# Patient Record
Sex: Female | Born: 1985 | Race: White | Hispanic: No | Marital: Married | Smoking: Never smoker
Health system: Southern US, Community
[De-identification: ages and names within clinical notes are randomized; demographics above are authoritative.]

## PROBLEM LIST (undated history)

## (undated) ENCOUNTER — Inpatient Hospital Stay (HOSPITAL_COMMUNITY): Payer: Self-pay

## (undated) DIAGNOSIS — K429 Umbilical hernia without obstruction or gangrene: Secondary | ICD-10-CM

## (undated) DIAGNOSIS — N200 Calculus of kidney: Secondary | ICD-10-CM

## (undated) DIAGNOSIS — Z8619 Personal history of other infectious and parasitic diseases: Secondary | ICD-10-CM

## (undated) DIAGNOSIS — Q179 Congenital malformation of ear, unspecified: Secondary | ICD-10-CM

## (undated) DIAGNOSIS — O24419 Gestational diabetes mellitus in pregnancy, unspecified control: Secondary | ICD-10-CM

## (undated) DIAGNOSIS — Q872 Congenital malformation syndromes predominantly involving limbs: Secondary | ICD-10-CM

## (undated) DIAGNOSIS — M1990 Unspecified osteoarthritis, unspecified site: Secondary | ICD-10-CM

## (undated) DIAGNOSIS — M549 Dorsalgia, unspecified: Secondary | ICD-10-CM

## (undated) DIAGNOSIS — G8929 Other chronic pain: Secondary | ICD-10-CM

## (undated) DIAGNOSIS — M419 Scoliosis, unspecified: Secondary | ICD-10-CM

## (undated) DIAGNOSIS — H9190 Unspecified hearing loss, unspecified ear: Secondary | ICD-10-CM

## (undated) DIAGNOSIS — Q899 Congenital malformation, unspecified: Secondary | ICD-10-CM

## (undated) HISTORY — DX: Congenital malformation, unspecified: Q89.9

## (undated) HISTORY — PX: EAR CANALOPLASTY: SHX1481

## (undated) HISTORY — DX: Congenital malformation syndromes predominantly involving limbs: Q87.2

## (undated) HISTORY — DX: Umbilical hernia without obstruction or gangrene: K42.9

## (undated) HISTORY — DX: Unspecified hearing loss, unspecified ear: H91.90

## (undated) HISTORY — PX: TONSILLECTOMY: SUR1361

## (undated) HISTORY — PX: MANDIBLE RECONSTRUCTION: SHX431

## (undated) HISTORY — PX: LEG SURGERY: SHX1003

## (undated) HISTORY — DX: Personal history of other infectious and parasitic diseases: Z86.19

---
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
## 2004-07-12 ENCOUNTER — Ambulatory Visit: Payer: Self-pay | Admitting: Internal Medicine

## 2004-12-24 ENCOUNTER — Ambulatory Visit: Payer: Self-pay | Admitting: Internal Medicine

## 2005-01-15 ENCOUNTER — Other Ambulatory Visit: Admission: RE | Admit: 2005-01-15 | Discharge: 2005-01-15 | Payer: Self-pay | Admitting: Internal Medicine

## 2005-01-15 ENCOUNTER — Ambulatory Visit: Payer: Self-pay | Admitting: Internal Medicine

## 2005-01-15 ENCOUNTER — Encounter: Payer: Self-pay | Admitting: Internal Medicine

## 2005-02-22 ENCOUNTER — Ambulatory Visit: Payer: Self-pay | Admitting: Internal Medicine

## 2005-10-10 ENCOUNTER — Emergency Department (HOSPITAL_COMMUNITY): Admission: EM | Admit: 2005-10-10 | Discharge: 2005-10-10 | Payer: Self-pay | Admitting: Emergency Medicine

## 2005-10-10 ENCOUNTER — Ambulatory Visit: Payer: Self-pay | Admitting: Internal Medicine

## 2006-04-17 ENCOUNTER — Ambulatory Visit: Payer: Self-pay | Admitting: Internal Medicine

## 2006-04-21 ENCOUNTER — Ambulatory Visit: Payer: Self-pay | Admitting: Internal Medicine

## 2006-04-21 LAB — CONVERTED CEMR LAB
ALT: 9 units/L (ref 0–40)
AST: 13 units/L (ref 0–37)
Albumin: 3.9 g/dL (ref 3.5–5.2)
Alkaline Phosphatase: 44 units/L (ref 39–117)
BUN: 8 mg/dL (ref 6–23)
Basophils Absolute: 0 10*3/uL (ref 0.0–0.1)
Basophils Relative: 0.5 % (ref 0.0–1.0)
Bilirubin, Direct: 0.1 mg/dL (ref 0.0–0.3)
CO2: 28 meq/L (ref 19–32)
Calcium: 9.2 mg/dL (ref 8.4–10.5)
Chloride: 108 meq/L (ref 96–112)
Cholesterol: 158 mg/dL (ref 0–200)
Creatinine, Ser: 0.7 mg/dL (ref 0.4–1.2)
Crystals: NEGATIVE
Eosinophils Absolute: 0.1 10*3/uL (ref 0.0–0.6)
Eosinophils Relative: 1 % (ref 0.0–5.0)
Ferritin: 60.8 ng/mL (ref 10.0–291.0)
GFR calc Af Amer: 135 mL/min
GFR calc non Af Amer: 111 mL/min
Glucose, Bld: 89 mg/dL (ref 70–99)
HCT: 41.6 % (ref 36.0–46.0)
HDL: 36.7 mg/dL — ABNORMAL LOW (ref >39.0)
Hemoglobin: 14.5 g/dL (ref 12.0–15.0)
LDL Cholesterol: 110 mg/dL — ABNORMAL HIGH (ref 0–99)
Leukocytes, UA: NEGATIVE
Lymphocytes Relative: 30.6 % (ref 12.0–46.0)
MCHC: 34.8 g/dL (ref 30.0–36.0)
MCV: 98.7 fL (ref 78.0–100.0)
Monocytes Absolute: 0.5 10*3/uL (ref 0.2–0.7)
Monocytes Relative: 8.8 % (ref 3.0–11.0)
Mucus, UA: NEGATIVE
Neutro Abs: 3.4 10*3/uL (ref 1.4–7.7)
Neutrophils Relative %: 59.1 % (ref 43.0–77.0)
Nitrite: NEGATIVE
Platelets: 227 10*3/uL (ref 150–400)
Potassium: 4.1 meq/L (ref 3.5–5.1)
RBC: 4.21 M/uL (ref 3.87–5.11)
RDW: 12 % (ref 11.5–14.6)
Sodium: 139 meq/L (ref 135–145)
Specific Gravity, Urine: 1.025 (ref 1.000–1.03)
TSH: 1.06 microintl units/mL (ref 0.35–5.50)
Total Bilirubin: 1.2 mg/dL (ref 0.3–1.2)
Total CHOL/HDL Ratio: 4.3
Total Protein: 6.7 g/dL (ref 6.0–8.3)
Triglycerides: 55 mg/dL (ref 0–149)
Urine Glucose: NEGATIVE mg/dL
Urobilinogen, UA: 1 (ref 0.0–1.0)
VLDL: 11 mg/dL (ref 0–40)
WBC, UA: NONE SEEN cells/hpf
WBC: 5.7 10*3/uL (ref 4.5–10.5)
pH: 6.5 (ref 5.0–8.0)

## 2006-06-20 ENCOUNTER — Ambulatory Visit: Payer: Self-pay | Admitting: Internal Medicine

## 2006-11-18 ENCOUNTER — Ambulatory Visit: Payer: Self-pay | Admitting: Internal Medicine

## 2006-11-18 DIAGNOSIS — R079 Chest pain, unspecified: Secondary | ICD-10-CM | POA: Insufficient documentation

## 2006-11-18 DIAGNOSIS — J029 Acute pharyngitis, unspecified: Secondary | ICD-10-CM | POA: Insufficient documentation

## 2006-11-18 DIAGNOSIS — M549 Dorsalgia, unspecified: Secondary | ICD-10-CM | POA: Insufficient documentation

## 2006-11-18 LAB — CONVERTED CEMR LAB
Bilirubin Urine: NEGATIVE
Glucose, Urine, Semiquant: NEGATIVE
Ketones, urine, test strip: NEGATIVE
Nitrite: NEGATIVE
Rapid Strep: NEGATIVE
Specific Gravity, Urine: 1.02
Urobilinogen, UA: NEGATIVE
pH: 7.5

## 2006-11-19 ENCOUNTER — Encounter: Payer: Self-pay | Admitting: Internal Medicine

## 2007-01-26 ENCOUNTER — Ambulatory Visit: Payer: Self-pay | Admitting: Internal Medicine

## 2007-01-26 DIAGNOSIS — R109 Unspecified abdominal pain: Secondary | ICD-10-CM | POA: Insufficient documentation

## 2007-01-26 DIAGNOSIS — J069 Acute upper respiratory infection, unspecified: Secondary | ICD-10-CM | POA: Insufficient documentation

## 2007-01-26 LAB — CONVERTED CEMR LAB
Glucose, Urine, Semiquant: NEGATIVE
Specific Gravity, Urine: 1.025
pH: 6

## 2007-01-27 ENCOUNTER — Encounter: Payer: Self-pay | Admitting: Internal Medicine

## 2008-03-28 ENCOUNTER — Ambulatory Visit: Payer: Self-pay | Admitting: Internal Medicine

## 2008-03-28 ENCOUNTER — Telehealth: Payer: Self-pay | Admitting: Internal Medicine

## 2008-03-28 LAB — CONVERTED CEMR LAB
Nitrite: NEGATIVE
Specific Gravity, Urine: 1.02

## 2008-03-29 ENCOUNTER — Encounter: Payer: Self-pay | Admitting: Internal Medicine

## 2008-07-05 ENCOUNTER — Emergency Department (HOSPITAL_COMMUNITY): Admission: EM | Admit: 2008-07-05 | Discharge: 2008-07-05 | Payer: Self-pay | Admitting: Emergency Medicine

## 2009-03-27 ENCOUNTER — Encounter: Payer: Self-pay | Admitting: Internal Medicine

## 2010-04-17 NOTE — Miscellaneous (Signed)
Summary: Flu Vaccination/[facility]  Flu Vaccination/[facility]   Imported By: Maryln Gottron 04/04/2009 12:56:32  _____________________________________________________________________  External Attachment:    Type:   Image     Comment:   External Document

## 2010-10-01 ENCOUNTER — Emergency Department (HOSPITAL_COMMUNITY)
Admission: EM | Admit: 2010-10-01 | Discharge: 2010-10-01 | Disposition: A | Discharge status: Home / Self Care | Payer: Self-pay | Attending: Emergency Medicine | Admitting: Emergency Medicine

## 2010-10-01 DIAGNOSIS — R300 Dysuria: Secondary | ICD-10-CM | POA: Insufficient documentation

## 2010-10-01 DIAGNOSIS — N390 Urinary tract infection, site not specified: Secondary | ICD-10-CM | POA: Insufficient documentation

## 2010-10-01 DIAGNOSIS — R109 Unspecified abdominal pain: Secondary | ICD-10-CM | POA: Insufficient documentation

## 2010-10-01 LAB — URINALYSIS, ROUTINE W REFLEX MICROSCOPIC
Bilirubin Urine: NEGATIVE
Glucose, UA: NEGATIVE mg/dL
Ketones, ur: NEGATIVE mg/dL
Nitrite: POSITIVE — AB
Protein, ur: NEGATIVE mg/dL

## 2010-10-01 LAB — WET PREP, GENITAL
Clue Cells Wet Prep HPF POC: NONE SEEN
Trich, Wet Prep: NONE SEEN

## 2010-10-01 LAB — URINE MICROSCOPIC-ADD ON

## 2010-10-01 LAB — POCT PREGNANCY, URINE: Preg Test, Ur: NEGATIVE

## 2011-06-13 ENCOUNTER — Inpatient Hospital Stay (HOSPITAL_COMMUNITY): Payer: Self-pay

## 2011-06-13 ENCOUNTER — Inpatient Hospital Stay (HOSPITAL_COMMUNITY)
Admission: AD | Admit: 2011-06-13 | Discharge: 2011-06-13 | Disposition: A | Discharge status: Home / Self Care | Payer: Self-pay | Source: Ambulatory Visit | Attending: Family Medicine | Admitting: Family Medicine

## 2011-06-13 ENCOUNTER — Encounter (HOSPITAL_COMMUNITY): Payer: Self-pay | Admitting: *Deleted

## 2011-06-13 DIAGNOSIS — R102 Pelvic and perineal pain: Secondary | ICD-10-CM

## 2011-06-13 DIAGNOSIS — N949 Unspecified condition associated with female genital organs and menstrual cycle: Secondary | ICD-10-CM

## 2011-06-13 DIAGNOSIS — M549 Dorsalgia, unspecified: Secondary | ICD-10-CM

## 2011-06-13 DIAGNOSIS — R109 Unspecified abdominal pain: Secondary | ICD-10-CM | POA: Insufficient documentation

## 2011-06-13 HISTORY — DX: Congenital malformation of ear, unspecified: Q17.9

## 2011-06-13 LAB — WET PREP, GENITAL
Clue Cells Wet Prep HPF POC: NONE SEEN
Trich, Wet Prep: NONE SEEN
Yeast Wet Prep HPF POC: NONE SEEN

## 2011-06-13 LAB — CBC
MCH: 32 pg (ref 26.0–34.0)
MCHC: 33.3 g/dL (ref 30.0–36.0)
Platelets: 201 10*3/uL (ref 150–400)
RDW: 12.8 % (ref 11.5–15.5)

## 2011-06-13 LAB — URINE MICROSCOPIC-ADD ON

## 2011-06-13 LAB — URINALYSIS, ROUTINE W REFLEX MICROSCOPIC
Bilirubin Urine: NEGATIVE
Glucose, UA: NEGATIVE mg/dL
Ketones, ur: NEGATIVE mg/dL
Leukocytes, UA: NEGATIVE
Protein, ur: NEGATIVE mg/dL
pH: 6.5 (ref 5.0–8.0)

## 2011-06-13 LAB — DIFFERENTIAL
Basophils Absolute: 0 10*3/uL (ref 0.0–0.1)
Basophils Relative: 0 % (ref 0–1)
Eosinophils Absolute: 0.1 10*3/uL (ref 0.0–0.7)
Monocytes Relative: 11 % (ref 3–12)
Neutro Abs: 2.6 10*3/uL (ref 1.7–7.7)
Neutrophils Relative %: 59 % (ref 43–77)

## 2011-06-13 MED ORDER — KETOROLAC TROMETHAMINE 60 MG/2ML IM SOLN
60.0000 mg | Freq: Once | INTRAMUSCULAR | Status: AC
  Administered 2011-06-13: 60 mg via INTRAMUSCULAR
  Filled 2011-06-13: qty 2

## 2011-06-13 NOTE — MAU Note (Signed)
BACK FROM U/S 

## 2011-06-13 NOTE — MAU Note (Signed)
[facility].  Having pain in lower abd, radiates through to the back, started a wk ago.Marland Kitchen No period since Feb.  Having some spotting,started a wk ago..  Haven't felt good for a while.

## 2011-06-13 NOTE — MAU Provider Note (Signed)
History     CSN: [id number]  Arrival date & time 06/13/11  1745   None     No chief complaint on file.   HPI Mariah Weaver is a 26 y.o. female who presents to MAU for lower abdominal pain. [facility] and had a negative pregnancy test but was told to come here because she may have an ectopic pregnancy. Did not do any blood work or spec. Exam. No history of STD's, current sex partner x 5 years. Last ap smear one year ago and was normal. The history was provided by the patient.   No past medical history on file.  No past surgical history on file.  No family history on file.  History  Substance Use Topics  . Smoking status: Not on file  . Smokeless tobacco: Not on file  . Alcohol Use: Not on file    OB History    No data available      Review of Systems  Constitutional: Negative for fever, chills, diaphoresis and fatigue.  HENT: Negative for ear pain, congestion, sore throat, facial swelling, neck pain, neck stiffness, dental problem and sinus pressure.   Eyes: Negative for photophobia, pain and discharge.  Respiratory: Negative for cough, chest tightness and wheezing.   Cardiovascular: Negative.   Gastrointestinal: Positive for abdominal pain. Negative for nausea, vomiting, diarrhea, constipation and abdominal distention.  Genitourinary: Positive for vaginal bleeding and pelvic pain. Negative for dysuria, urgency, frequency, flank pain, vaginal discharge and difficulty urinating.  Musculoskeletal: Negative for myalgias, back pain and gait problem.  Skin: Negative for color change and rash.  Neurological: Positive for headaches. Negative for dizziness, speech difficulty, weakness, light-headedness and numbness.  Psychiatric/Behavioral: Negative for confusion and agitation. The patient is not nervous/anxious.     Allergies  Eryzole and Sulfamethoxazole  Home Medications  No current outpatient prescriptions on file.  BP 124/74  Pulse 95  Temp(Src)  98.5 F (36.9 C) (Oral)  Resp 20  Ht 5\' 5"  (1.651 m)  Wt 229 lb (103.874 kg)  BMI 38.11 kg/m2  SpO2 98%  LMP 05/03/2011  Physical Exam  Nursing note and vitals reviewed. Constitutional: She is oriented to person, place, and time. She appears well-developed and well-nourished. No distress.  HENT:  Head: Normocephalic.  Eyes: EOM are normal.  Neck: Neck supple.  Pulmonary/Chest: Effort normal.  Abdominal: Soft. There is tenderness in the right lower quadrant, suprapubic area and left lower quadrant. There is no rigidity, no rebound, no guarding and no CVA tenderness.  Genitourinary:       External genitalia without lesions. White discharge vaginal vault. Mild CMT, bilateral adnexal tenderness. Uterus without palpable enlargement.  Musculoskeletal: Normal range of motion.  Neurological: She is alert and oriented to person, place, and time. No cranial nerve deficit.  Skin: Skin is warm and dry.  Psychiatric: She has a normal mood and affect. Her behavior is normal. Judgment and thought content normal.   Results for orders placed during the hospital encounter of 06/13/11 (from the past 24 hour(s))  URINALYSIS, ROUTINE W REFLEX MICROSCOPIC     Status: Abnormal   Collection Time   06/13/11  6:10 PM      Component Value Range   Color, Urine YELLOW  YELLOW    APPearance HAZY (*) CLEAR    Specific Gravity, Urine 1.010  1.005 - 1.030    pH 6.5  5.0 - 8.0    Glucose, UA NEGATIVE  NEGATIVE (mg/dL)  Hgb urine dipstick MODERATE (*) NEGATIVE    Bilirubin Urine NEGATIVE  NEGATIVE    Ketones, ur NEGATIVE  NEGATIVE (mg/dL)   Protein, ur NEGATIVE  NEGATIVE (mg/dL)   Urobilinogen, UA 1.0  0.0 - 1.0 (mg/dL)   Nitrite NEGATIVE  NEGATIVE    Leukocytes, UA NEGATIVE  NEGATIVE   URINE MICROSCOPIC-ADD ON     Status: Abnormal   Collection Time   06/13/11  6:10 PM      Component Value Range   Squamous Epithelial / LPF FEW (*) RARE    RBC / HPF 3-6  <3 (RBC/hpf)  POCT PREGNANCY, URINE     Status:  Normal   Collection Time   06/13/11  6:21 PM      Component Value Range   Preg Test, Ur NEGATIVE  NEGATIVE   WET PREP, GENITAL     Status: Abnormal   Collection Time   06/13/11  6:25 PM      Component Value Range   Yeast Wet Prep HPF POC NONE SEEN  NONE SEEN    Trich, Wet Prep NONE SEEN  NONE SEEN    Clue Cells Wet Prep HPF POC NONE SEEN  NONE SEEN    WBC, Wet Prep HPF POC FEW (*) NONE SEEN   HCG, QUANTITATIVE, PREGNANCY     Status: Normal   Collection Time   06/13/11  6:35 PM      Component Value Range   hCG, Beta Chain, Quant, S <1  <5 (mIU/mL)  CBC     Status: Normal   Collection Time   06/13/11  6:35 PM      Component Value Range   WBC 4.5  4.0 - 10.5 (K/uL)   RBC 4.38  3.87 - 5.11 (MIL/uL)   Hemoglobin 14.0  12.0 - 15.0 (g/dL)   HCT 16.1  09.6 - 04.5 (%)   MCV 95.9  78.0 - 100.0 (fL)   MCH 32.0  26.0 - 34.0 (pg)   MCHC 33.3  30.0 - 36.0 (g/dL)   RDW 40.9  81.1 - 91.4 (%)   Platelets 201  150 - 400 (K/uL)  DIFFERENTIAL     Status: Normal   Collection Time   06/13/11  6:35 PM      Component Value Range   Neutrophils Relative 59  43 - 77 (%)   Neutro Abs 2.6  1.7 - 7.7 (K/uL)   Lymphocytes Relative 28  12 - 46 (%)   Lymphs Abs 1.3  0.7 - 4.0 (K/uL)   Monocytes Relative 11  3 - 12 (%)   Monocytes Absolute 0.5  0.1 - 1.0 (K/uL)   Eosinophils Relative 2  0 - 5 (%)   Eosinophils Absolute 0.1  0.0 - 0.7 (K/uL)   Basophils Relative 0  0 - 1 (%)   Basophils Absolute 0.0  0.0 - 0.1 (K/uL)   Care turned over to Georges Mouse, CNM @ 20:00 ED Course  Procedures  US Transvaginal Non-ob  06/13/2011  *RADIOLOGY REPORT*  Clinical Data: 26 year old female with pelvic pain.  Negative pregnancy test.  TRANSABDOMINAL AND TRANSVAGINAL ULTRASOUND OF PELVIS Technique:  Both transabdominal and transvaginal ultrasound examinations of the pelvis were performed. Transabdominal technique was performed for global imaging of the pelvis including uterus, ovaries, adnexal regions, and pelvic  cul-de-sac.  Comparison: None   It was necessary to proceed with endovaginal exam following the transabdominal exam to visualize the endometrium and ovaries.  Findings:  Uterus: The uterus is normal in appearance and  size measuring 7.8 x 6.1 x 3.5 cm.  No focal uterine lesions are identified.  Endometrium: A small amount of fluid within the endometrial and cervical canal noted.  The endometrial stripe is unremarkable measuring 4 mm in thickness.  Right ovary:  The right ovary is unremarkable measuring 4.5 x 1.9 x 2.8 cm.  Left ovary: The left ovary is unremarkable measuring 2.5 x 2.1 x 1.4 cm.  Other findings: A trace amount of free fluid may be physiologic. There is no evidence of adnexal mass.  IMPRESSION: Small amount of fluid/blood within the endometrial/cervical canal. Unremarkable endometrial stripe.  Trace amount of free fluid - likely physiologic.  Unremarkable ovaries.  Original Report Authenticated By: Rosendo Gros, M.D.   US Pelvis Complete  06/13/2011  *RADIOLOGY REPORT*  Clinical Data: 26 year old female with pelvic pain.  Negative pregnancy test.  TRANSABDOMINAL AND TRANSVAGINAL ULTRASOUND OF PELVIS Technique:  Both transabdominal and transvaginal ultrasound examinations of the pelvis were performed. Transabdominal technique was performed for global imaging of the pelvis including uterus, ovaries, adnexal regions, and pelvic cul-de-sac.  Comparison: None   It was necessary to proceed with endovaginal exam following the transabdominal exam to visualize the endometrium and ovaries.  Findings:  Uterus: The uterus is normal in appearance and size measuring 7.8 x 6.1 x 3.5 cm.  No focal uterine lesions are identified.  Endometrium: A small amount of fluid within the endometrial and cervical canal noted.  The endometrial stripe is unremarkable measuring 4 mm in thickness.  Right ovary:  The right ovary is unremarkable measuring 4.5 x 1.9 x 2.8 cm.  Left ovary: The left ovary is unremarkable measuring 2.5  x 2.1 x 1.4 cm.  Other findings: A trace amount of free fluid may be physiologic. There is no evidence of adnexal mass.  IMPRESSION: Small amount of fluid/blood within the endometrial/cervical canal. Unremarkable endometrial stripe.  Trace amount of free fluid - likely physiologic.  Unremarkable ovaries.  Original Report Authenticated By: Rosendo Gros, M.D.    MDM    A/P: 26 y.o. G0P0 with low abd pain No OB/GYN cause for pain identified today, advised f/u at [facility] if pain becomes severe

## 2011-06-14 LAB — GC/CHLAMYDIA PROBE AMP, GENITAL
Chlamydia, DNA Probe: NEGATIVE
GC Probe Amp, Genital: NEGATIVE

## 2011-06-14 NOTE — MAU Provider Note (Signed)
Chart reviewed and agree with management and plan.  

## 2011-08-20 LAB — OB RESULTS CONSOLE ABO/RH: RH Type: POSITIVE

## 2011-08-20 LAB — OB RESULTS CONSOLE ANTIBODY SCREEN: Antibody Screen: NEGATIVE

## 2011-08-20 LAB — OB RESULTS CONSOLE HEPATITIS B SURFACE ANTIGEN: Hepatitis B Surface Ag: NEGATIVE

## 2012-02-16 ENCOUNTER — Inpatient Hospital Stay (HOSPITAL_COMMUNITY)
Admission: AD | Admit: 2012-02-16 | Discharge: 2012-02-16 | Disposition: A | Discharge status: Home / Self Care | Payer: Medicaid Other | Source: Ambulatory Visit | Attending: Obstetrics & Gynecology | Admitting: Obstetrics & Gynecology

## 2012-02-16 ENCOUNTER — Inpatient Hospital Stay (HOSPITAL_COMMUNITY): Payer: Medicaid Other

## 2012-02-16 ENCOUNTER — Encounter (HOSPITAL_COMMUNITY): Payer: Self-pay | Admitting: *Deleted

## 2012-02-16 DIAGNOSIS — O99891 Other specified diseases and conditions complicating pregnancy: Secondary | ICD-10-CM | POA: Insufficient documentation

## 2012-02-16 DIAGNOSIS — M549 Dorsalgia, unspecified: Secondary | ICD-10-CM

## 2012-02-16 DIAGNOSIS — M545 Low back pain, unspecified: Secondary | ICD-10-CM | POA: Insufficient documentation

## 2012-02-16 DIAGNOSIS — O26899 Other specified pregnancy related conditions, unspecified trimester: Secondary | ICD-10-CM

## 2012-02-16 HISTORY — DX: Scoliosis, unspecified: M41.9

## 2012-02-16 LAB — URINE MICROSCOPIC-ADD ON

## 2012-02-16 LAB — URINALYSIS, ROUTINE W REFLEX MICROSCOPIC
Bilirubin Urine: NEGATIVE
Ketones, ur: NEGATIVE mg/dL
Nitrite: NEGATIVE
Protein, ur: NEGATIVE mg/dL
pH: 6.5 (ref 5.0–8.0)

## 2012-02-16 NOTE — MAU Provider Note (Signed)
History     CSN: [id number]  Arrival date and time: 02/16/12 9147   First Provider Initiated Contact with Patient 02/16/12 [phone]      Chief Complaint  Patient presents with  . Contractions   HPI  Pt is here with left sided middle and lower back pain that radiates to the groin area.  Pain is described like spasms and started around midnight.  No vaginal bleeding reported and no leaking of fluid.  +fetal movement.    Past Medical History  Diagnosis Date  . Ear malformation   . Scoliosis     Past Surgical History  Procedure Date  . Tonsillectomy   . Ear canaloplasty   . Mandible reconstruction   . Leg surgery     Family History  Problem Relation Age of Onset  . Adopted: Yes  . Heart disease Mother   . Heart disease Father     History  Substance Use Topics  . Smoking status: Former Games developer  . Smokeless tobacco: Not on file  . Alcohol Use: No    Allergies:  Allergies  Allergen Reactions  . Eryzole Hives and Itching    REACTION: unspecified  . Sulfamethoxazole Hives and Itching    REACTION: unspecified    Prescriptions prior to admission  Medication Sig Dispense Refill  . acetaminophen (TYLENOL) 325 MG tablet Take 650 mg by mouth every 6 (six) hours as needed.      . Prenatal Vit-Fe Fumarate-FA (PRENATAL MULTIVITAMIN) TABS Take 1 tablet by mouth daily.        Review of Systems  Gastrointestinal: Positive for abdominal pain.  Musculoskeletal: Positive for back pain.  All other systems reviewed and are negative.   Physical Exam   Blood pressure 126/68, pulse 86, temperature 97.8 F (36.6 C), temperature source Oral, resp. rate 20, height 5' 4.5" (1.638 m), weight 110.859 kg (244 lb 6.4 oz), last menstrual period 05/03/2011, SpO2 98.00%.  Physical Exam  Constitutional: She is oriented to person, place, and time. She appears well-developed and well-nourished. No distress.  HENT:  Head: Normocephalic.  Neck: Normal range of motion. Neck supple.    Cardiovascular: Normal rate, regular rhythm and normal heart sounds.   Respiratory: Effort normal and breath sounds normal.  GI: Soft. There is no tenderness.  Genitourinary: No bleeding around the vagina. Vaginal discharge (mucusy) found.  Musculoskeletal: Normal range of motion. She exhibits edema (trace bilat pedal).       Lumbar back: She exhibits tenderness, pain and spasm.  Neurological: She is alert and oriented to person, place, and time.  Skin: Skin is warm and dry.   Dilation: Fingertip Effacement (%): 40 Cervical Position: Posterior Station: -3 Exam by:: Quintella Baton RNC   MAU Course  Procedures Results for orders placed during the hospital encounter of 02/16/12 (from the past 24 hour(s))  URINALYSIS, ROUTINE W REFLEX MICROSCOPIC     Status: Abnormal   Collection Time   02/16/12  5:20 AM      Component Value Range   Color, Urine YELLOW  YELLOW   APPearance CLEAR  CLEAR   Specific Gravity, Urine 1.010  1.005 - 1.030   pH 6.5  5.0 - 8.0   Glucose, UA NEGATIVE  NEGATIVE mg/dL   Hgb urine dipstick TRACE (*) NEGATIVE   Bilirubin Urine NEGATIVE  NEGATIVE   Ketones, ur NEGATIVE  NEGATIVE mg/dL   Protein, ur NEGATIVE  NEGATIVE mg/dL   Urobilinogen, UA 0.2  0.0 - 1.0 mg/dL   Nitrite NEGATIVE  NEGATIVE   Leukocytes, UA TRACE (*) NEGATIVE  URINE MICROSCOPIC-ADD ON     Status: Abnormal   Collection Time   02/16/12  5:20 AM      Component Value Range   Squamous Epithelial / LPF MANY (*) RARE   WBC, UA 3-6  <3 WBC/hpf   Bacteria, UA RARE  RARE   Ultrasound: IMPRESSION:  1. Very mild right-sided caliectasis without overt hydronephrosis. 2. Unremarkable sonographic appearance of the left kidney. 3. Bilateral ureteral jets identified in the bladder.   1610 Cervical recheck > closed/FT  FHR 140's, +accels, reactive Toco - none  Consulted with Dr. Langston Masker, reviewed HPI/exam/ultrasound > ok to discharge home.  Assessment and Plan  Back Pain in Pregnancy Category I  Tracing  Plan: DC to home Provide reassurance Keep scheduled appointment  [facility] 02/16/2012, 6:21 AM

## 2012-02-16 NOTE — Progress Notes (Signed)
W.Muhammed CNM notified of pt's admission and status. Will see pt 

## 2012-02-16 NOTE — MAU Note (Signed)
Contractions since 2400

## 2012-02-18 ENCOUNTER — Encounter (HOSPITAL_COMMUNITY): Payer: Self-pay

## 2012-02-18 ENCOUNTER — Inpatient Hospital Stay (HOSPITAL_COMMUNITY)
Admission: AD | Admit: 2012-02-18 | Discharge: 2012-02-18 | Disposition: A | Discharge status: Home / Self Care | Payer: Medicaid Other | Source: Ambulatory Visit | Attending: Obstetrics and Gynecology | Admitting: Obstetrics and Gynecology

## 2012-02-18 DIAGNOSIS — O368190 Decreased fetal movements, unspecified trimester, not applicable or unspecified: Secondary | ICD-10-CM | POA: Insufficient documentation

## 2012-02-18 DIAGNOSIS — N949 Unspecified condition associated with female genital organs and menstrual cycle: Secondary | ICD-10-CM

## 2012-02-18 DIAGNOSIS — R109 Unspecified abdominal pain: Secondary | ICD-10-CM | POA: Insufficient documentation

## 2012-02-18 DIAGNOSIS — M545 Low back pain, unspecified: Secondary | ICD-10-CM | POA: Insufficient documentation

## 2012-02-18 DIAGNOSIS — O99891 Other specified diseases and conditions complicating pregnancy: Secondary | ICD-10-CM | POA: Insufficient documentation

## 2012-02-18 LAB — URINALYSIS, ROUTINE W REFLEX MICROSCOPIC
Bilirubin Urine: NEGATIVE
Glucose, UA: NEGATIVE mg/dL
Leukocytes, UA: NEGATIVE
Nitrite: NEGATIVE
Specific Gravity, Urine: >1.03 — ABNORMAL HIGH (ref 1.005–1.030)
pH: 6 (ref 5.0–8.0)

## 2012-02-18 LAB — URINE MICROSCOPIC-ADD ON

## 2012-02-18 MED ORDER — ACETAMINOPHEN 500 MG PO TABS
1000.0000 mg | ORAL_TABLET | Freq: Once | ORAL | Status: AC
  Administered 2012-02-18: 1000 mg via ORAL
  Filled 2012-02-18: qty 2

## 2012-02-18 NOTE — MAU Provider Note (Signed)
Chief Complaint:  Abdominal Pain and Back Pain   First Provider Initiated Contact with Patient 02/18/12 1744      HPI: Mariah Weaver is a 26 y.o. G1P0 at [redacted]w[redacted]d who presents to maternity admissions reporting onset today of bilateral constant sharp lower abdominal pain and low back pain The pain is continuous but waxes and wanes. It is worse with movement and repositioning. It is dissimilar to the flank and side pain she was having 2 days ago when she was evaluated at MAU. Denies dysuria, gross hematuria, urinary urgency or frequency. Not having back pain at present. Has had issues before with low back pain due to her scoliosis. Denies contractions, leakage of fluid or vaginal bleeding. Good fetal movement.   Pregnancy Course: Significant for obesity, ?Sturge-Weber syndrome, hearing loss. Dipstick hematuria present since prepregnnacy  Past Medical History: Past Medical History  Diagnosis Date  . Ear malformation   . Scoliosis     Past obstetric history: OB History    Grav Para Term Preterm Abortions TAB SAB Ect Mult Living   1              # Outc Date GA Lbr Len/2nd Wgt Sex Del Anes PTL Lv   1 CUR               Past Surgical History: Past Surgical History  Procedure Date  . Tonsillectomy   . Ear canaloplasty   . Mandible reconstruction   . Leg surgery     Family History: Family History  Problem Relation Age of Onset  . Adopted: Yes  . Heart disease Mother   . Heart disease Father     Social History: History  Substance Use Topics  . Smoking status: Former Games developer  . Smokeless tobacco: Not on file  . Alcohol Use: No    Allergies:  Allergies  Allergen Reactions  . Sulfamethoxazole Hives and Itching    REACTION: unspecified    Meds:  Prescriptions prior to admission  Medication Sig Dispense Refill  . acetaminophen (TYLENOL) 325 MG tablet Take 650 mg by mouth every 6 (six) hours as needed.      . Prenatal Vit-Fe Fumarate-FA (PRENATAL MULTIVITAMIN) TABS Take 1  tablet by mouth daily.        ROS: Pertinent findings in history of present illness.  Physical Exam  Blood pressure 121/66, pulse 102, temperature 97.8 F (36.6 C), temperature source Oral, resp. rate 20, height 5\' 4"  (1.626 m), weight 242 lb (109.77 kg), last menstrual period 05/31/2011. GENERAL:  female in no acute distress.  HEENT: normocephalic HEART: normal rate RESP: normal effort ABDOMEN: Soft, non-tender, gravid appropriate for gestational age EXTREMITIES: Nontender, no edema NEURO: alert and oriented Dilation: Closed Effacement (%):  (long) Cervical Position: Posterior Exam by:: diedre Tashica Provencio cnm  FHT:  Baseline 145 , moderate variability, accelerations present, no decelerations Contractions: UI and occ mild UC   Labs: Results for orders placed during the hospital encounter of 02/18/12 (from the past 24 hour(s))  URINALYSIS, ROUTINE W REFLEX MICROSCOPIC     Status: Abnormal   Collection Time   02/18/12  4:55 PM      Component Value Range   Color, Urine YELLOW  YELLOW   APPearance HAZY (*) CLEAR   Specific Gravity, Urine >1.030 (*) 1.005 - 1.030   pH 6.0  5.0 - 8.0   Glucose, UA NEGATIVE  NEGATIVE mg/dL   Hgb urine dipstick SMALL (*) NEGATIVE   Bilirubin Urine NEGATIVE  NEGATIVE  Ketones, ur NEGATIVE  NEGATIVE mg/dL   Protein, ur NEGATIVE  NEGATIVE mg/dL   Urobilinogen, UA 0.2  0.0 - 1.0 mg/dL   Nitrite NEGATIVE  NEGATIVE   Leukocytes, UA NEGATIVE  NEGATIVE  URINE MICROSCOPIC-ADD ON     Status: Abnormal   Collection Time   02/18/12  4:55 PM      Component Value Range   Squamous Epithelial / LPF MANY (*) RARE   WBC, UA 0-2  <3 WBC/hpf   Bacteria, UA FEW (*) RARE   Crystals CA OXALATE CRYSTALS (*) NEGATIVE   Urine-Other MUCOUS PRESENT      Imaging:  US Renal  02/16/2012  *RADIOLOGY REPORT*  Clinical Data:  Flank pain; hematuria, [redacted] weeks pregnant  RENAL/URINARY TRACT ULTRASOUND COMPLETE  Comparison:  Prior pelvic ultrasound 06/13/2011  Findings:  Right Kidney:   Normal in size (12.6 cm) and parenchymal echogenicity.  Minimal caliectasis.  There is no overt hydronephrosis.  Left Kidney:  Normal in size (13 cm) and parenchymal echogenicity. No evidence of mass or hydronephrosis.  Bladder:  Appears normal for degree of bladder distention. Bilateral ureteral jets are identified.  IMPRESSION:  1.  Very mild right-sided caliectasis without overt hydronephrosis. 2.  Unremarkable sonographic appearance of the left kidney. 3.  Bilateral ureteral jets identified in the bladder.   Original Report Authenticated By: Malachy Moan, M.D.    MAU Course:   Assessment: 1. Round ligament pain   G1 at [redacted]w[redacted]d with musculoskeletal pain Sturge-Weber  Plan: D/W Dr. Marcelle Overlie Discharge home on Tylenol 1000 q8h Labor precautions and fetal kick counts  Follow-up Information    Follow up with Meriel Pica, MD. In 1 day.   Contact information:   [address] [place] [address] [phone]           Medication List     As of 02/18/2012  5:45 PM    ASK your doctor about these medications         acetaminophen 325 MG tablet   Commonly known as: TYLENOL   Take 650 mg by mouth every 6 (six) hours as needed.      prenatal multivitamin Tabs   Take 1 tablet by mouth daily.        Danae Orleans, CNM 02/18/2012 5:45 PM

## 2012-02-18 NOTE — MAU Note (Signed)
Patient is in with c/o lower back and abdominal pain and constant llq pain which started this morning. Patient states that she have had kidney stones this pregnancy but this pain is different. She denies bleeding or lof. Reports decreased fetal movement.

## 2012-02-18 NOTE — MAU Note (Signed)
Dx with a kidney stone.  Pain started yesterday, getting worse. No bleeding or leaking.

## 2012-03-10 LAB — OB RESULTS CONSOLE GBS: GBS: NEGATIVE

## 2012-03-30 ENCOUNTER — Encounter (HOSPITAL_COMMUNITY): Payer: Self-pay | Admitting: *Deleted

## 2012-03-30 ENCOUNTER — Inpatient Hospital Stay (HOSPITAL_COMMUNITY)
Admission: AD | Admit: 2012-03-30 | Discharge: 2012-03-30 | Disposition: A | Discharge status: Home / Self Care | Payer: Medicaid Other | Source: Ambulatory Visit | Attending: Obstetrics and Gynecology | Admitting: Obstetrics and Gynecology

## 2012-03-30 DIAGNOSIS — O479 False labor, unspecified: Secondary | ICD-10-CM | POA: Insufficient documentation

## 2012-03-30 DIAGNOSIS — M549 Dorsalgia, unspecified: Secondary | ICD-10-CM | POA: Insufficient documentation

## 2012-03-30 HISTORY — DX: Dorsalgia, unspecified: M54.9

## 2012-03-30 HISTORY — DX: Other chronic pain: G89.29

## 2012-03-30 MED ORDER — OXYCODONE-ACETAMINOPHEN 5-325 MG PO TABS: 1.0000 | ORAL_TABLET | ORAL | Status: DC | PRN

## 2012-03-30 NOTE — MAU Note (Signed)
Thinks she is having contractions. Also has chronic back pain and seems to be worse and rhythmic today. States her R hip started hurting today and makes it difficult to walk. Was brought to room in W/C

## 2012-03-31 ENCOUNTER — Telehealth (HOSPITAL_COMMUNITY): Payer: Self-pay | Admitting: *Deleted

## 2012-03-31 ENCOUNTER — Encounter (HOSPITAL_COMMUNITY): Payer: Self-pay | Admitting: *Deleted

## 2012-03-31 NOTE — Telephone Encounter (Signed)
Preadmission screen  

## 2012-04-03 ENCOUNTER — Inpatient Hospital Stay (HOSPITAL_COMMUNITY)
Admission: AD | Admit: 2012-04-03 | Discharge: 2012-04-03 | Disposition: A | Discharge status: Home / Self Care | Payer: Medicaid Other | Source: Ambulatory Visit | Attending: Obstetrics and Gynecology | Admitting: Obstetrics and Gynecology

## 2012-04-03 ENCOUNTER — Encounter (HOSPITAL_COMMUNITY): Payer: Self-pay

## 2012-04-03 DIAGNOSIS — N760 Acute vaginitis: Secondary | ICD-10-CM

## 2012-04-03 DIAGNOSIS — L293 Anogenital pruritus, unspecified: Secondary | ICD-10-CM | POA: Insufficient documentation

## 2012-04-03 DIAGNOSIS — O2390 Unspecified genitourinary tract infection in pregnancy, unspecified trimester: Secondary | ICD-10-CM | POA: Insufficient documentation

## 2012-04-03 DIAGNOSIS — B9689 Other specified bacterial agents as the cause of diseases classified elsewhere: Secondary | ICD-10-CM | POA: Insufficient documentation

## 2012-04-03 DIAGNOSIS — N949 Unspecified condition associated with female genital organs and menstrual cycle: Secondary | ICD-10-CM | POA: Insufficient documentation

## 2012-04-03 DIAGNOSIS — A499 Bacterial infection, unspecified: Secondary | ICD-10-CM | POA: Insufficient documentation

## 2012-04-03 LAB — WET PREP, GENITAL
Trich, Wet Prep: NONE SEEN
Yeast Wet Prep HPF POC: NONE SEEN

## 2012-04-03 MED ORDER — METRONIDAZOLE 500 MG PO TABS: 500.0000 mg | ORAL_TABLET | Freq: Two times a day (BID) | ORAL | Status: DC

## 2012-04-03 NOTE — MAU Note (Signed)
Had gush of fluid around 5:00 unsure if water broke or has yeast infection having vagina itching.

## 2012-04-03 NOTE — MAU Provider Note (Signed)
History     CSN: [id number]  Arrival date and time: 04/03/12 2021   None     Chief Complaint  Patient presents with  . Vaginal Discharge   HPI 27 y.o. G1P0 at [redacted]w[redacted]d with "trickle" of fluid x 1 earlier tonight, "more discharge than usual" and vaginal itching. No bleeding, no contractions, + fetal movement.   Past Medical History  Diagnosis Date  . Ear malformation   . Scoliosis   . Scoliosis   . Chronic back pain   . Klippel Trenaunay syndrome   . Hx of varicella   . Hearing loss     60% L ear and 40% right ear  . 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 Tonsillectomy   . Ear canaloplasty   . Mandible reconstruction   . Leg surgery     Family History  Problem Relation Age of Onset  . Adopted: Yes  . Heart disease Mother   . Heart disease Father     History  Substance Use Topics  . Smoking status: Former Games developer  . Smokeless tobacco: Not on file  . Alcohol Use: No    Allergies:  Allergies  Allergen Reactions  . Sulfamethoxazole Hives and Itching    Prescriptions prior to admission  Medication Sig Dispense Refill  . acetaminophen (TYLENOL) 325 MG tablet Take 650 mg by mouth every 6 (six) hours as needed. For pain      . oxyCODONE-acetaminophen (PERCOCET/ROXICET) 5-325 MG per tablet Take 1 tablet by mouth every 4 (four) hours as needed for pain.  20 tablet  0  . Prenatal Vit-Fe Fumarate-FA (PRENATAL MULTIVITAMIN) TABS Take 1 tablet by mouth 2 (two) times daily as needed. Depending on the way the patient feels        Review of Systems  Constitutional: Negative.   Respiratory: Negative.   Cardiovascular: Negative.   Gastrointestinal: Negative for nausea, vomiting, abdominal pain, diarrhea and constipation.  Genitourinary: Negative for dysuria, urgency, frequency, hematuria and flank pain.       Negative for vaginal bleeding, cramping/contractions  Musculoskeletal: Positive for back pain.    Neurological: Negative.   Psychiatric/Behavioral: Negative.    Physical Exam   Blood pressure 121/56, pulse 82, temperature 97 F (36.1 C), temperature source Oral, resp. rate 16, last menstrual period 05/31/2011.  Physical Exam  Nursing note and vitals reviewed. Constitutional: She is oriented to person, place, and time. She appears well-developed and well-nourished. No distress.  Cardiovascular: Normal rate.   Respiratory: Effort normal.  GI: Soft. There is no tenderness.  Genitourinary: Vaginal discharge (clear/white mucous, malodorous) found.       Dilation: Closed Cervical Position: Posterior Station: Ballotable Exam by:: Jeri Cos CNM   Musculoskeletal: Normal range of motion.  Neurological: She is alert and oriented to person, place, and time.  Skin: Skin is warm and dry.  Psychiatric: She has a normal mood and affect.    MAU Course  Procedures  Results for orders placed during the hospital encounter of 04/03/12 (from the past 24 hour(s))  WET PREP, GENITAL     Status: Abnormal   Collection Time   04/03/12  8:50 PM      Component Value Range   Yeast Wet Prep HPF POC NONE SEEN  NONE SEEN   Trich, Wet Prep NONE SEEN  NONE SEEN   Clue Cells Wet Prep HPF POC FEW (*) NONE SEEN   WBC,  Wet Prep HPF POC FEW (*) NONE SEEN  POCT FERN TEST     Status: Normal   Collection Time   04/03/12  9:00 PM      Component Value Range   POCT Fern Test         Assessment and Plan   1. Bacterial vaginosis       Medication List     As of 04/03/2012  9:42 PM    START taking these medications         metroNIDAZOLE 500 MG tablet   Commonly known as: FLAGYL   Take 1 tablet (500 mg total) by mouth 2 (two) times daily.      CONTINUE taking these medications         acetaminophen 325 MG tablet   Commonly known as: TYLENOL      oxyCODONE-acetaminophen 5-325 MG per tablet   Commonly known as: PERCOCET/ROXICET   Take 1 tablet by mouth every 4 (four) hours as needed for  pain.      prenatal multivitamin Tabs          Where to get your medications    These are the prescriptions that you need to pick up.   You may get these medications from any pharmacy.         metroNIDAZOLE 500 MG tablet            Follow-up Information    Follow up with Meriel Pica, MD. (as scheduled)    Contact information:   [address] [place] [address] [phone]            [facility] 04/03/2012, 8:40 PM

## 2012-04-05 ENCOUNTER — Encounter (HOSPITAL_COMMUNITY): Payer: Self-pay

## 2012-04-05 ENCOUNTER — Inpatient Hospital Stay (HOSPITAL_COMMUNITY)
Admission: RE | Admit: 2012-04-05 | Discharge: 2012-04-10 | DRG: 765 | Disposition: A | Discharge status: Home / Self Care | Payer: Medicaid Other | Source: Ambulatory Visit | Attending: Obstetrics and Gynecology | Admitting: Obstetrics and Gynecology

## 2012-04-05 ENCOUNTER — Inpatient Hospital Stay (HOSPITAL_COMMUNITY): Admit: 2012-04-05 | Payer: Self-pay

## 2012-04-05 DIAGNOSIS — M549 Dorsalgia, unspecified: Secondary | ICD-10-CM

## 2012-04-05 DIAGNOSIS — R079 Chest pain, unspecified: Secondary | ICD-10-CM

## 2012-04-05 DIAGNOSIS — O909 Complication of the puerperium, unspecified: Secondary | ICD-10-CM | POA: Diagnosis not present

## 2012-04-05 DIAGNOSIS — O99892 Other specified diseases and conditions complicating childbirth: Secondary | ICD-10-CM | POA: Diagnosis present

## 2012-04-05 DIAGNOSIS — Q898 Other specified congenital malformations: Secondary | ICD-10-CM

## 2012-04-05 DIAGNOSIS — L03319 Cellulitis of trunk, unspecified: Secondary | ICD-10-CM | POA: Diagnosis not present

## 2012-04-05 DIAGNOSIS — O324XX0 Maternal care for high head at term, not applicable or unspecified: Secondary | ICD-10-CM | POA: Diagnosis present

## 2012-04-05 DIAGNOSIS — L02219 Cutaneous abscess of trunk, unspecified: Secondary | ICD-10-CM | POA: Diagnosis not present

## 2012-04-05 DIAGNOSIS — R109 Unspecified abdominal pain: Secondary | ICD-10-CM

## 2012-04-05 DIAGNOSIS — J069 Acute upper respiratory infection, unspecified: Secondary | ICD-10-CM

## 2012-04-05 DIAGNOSIS — J029 Acute pharyngitis, unspecified: Secondary | ICD-10-CM

## 2012-04-05 LAB — CBC
Hemoglobin: 11.9 g/dL — ABNORMAL LOW (ref 12.0–15.0)
MCHC: 33.1 g/dL (ref 30.0–36.0)
RDW: 14.8 % (ref 11.5–15.5)
WBC: 9.1 10*3/uL (ref 4.0–10.5)

## 2012-04-05 MED ORDER — LACTATED RINGERS IV SOLN
INTRAVENOUS | Status: DC
  Administered 2012-04-06 (×3): via INTRAVENOUS

## 2012-04-05 MED ORDER — ACETAMINOPHEN 325 MG PO TABS: 650.0000 mg | ORAL_TABLET | ORAL | Status: DC | PRN

## 2012-04-05 MED ORDER — LACTATED RINGERS IV SOLN: 500.0000 mL | INTRAVENOUS | Status: DC | PRN

## 2012-04-05 MED ORDER — IBUPROFEN 600 MG PO TABS: 600.0000 mg | ORAL_TABLET | Freq: Four times a day (QID) | ORAL | Status: DC | PRN

## 2012-04-05 MED ORDER — OXYTOCIN 40 UNITS IN LACTATED RINGERS INFUSION - SIMPLE MED
62.5000 mL/h | INTRAVENOUS | Status: DC
  Filled 2012-04-05: qty 1000

## 2012-04-05 MED ORDER — MISOPROSTOL 25 MCG QUARTER TABLET
25.0000 ug | ORAL_TABLET | ORAL | Status: DC | PRN
  Administered 2012-04-05 – 2012-04-06 (×2): 25 ug via VAGINAL
  Filled 2012-04-05 (×2): qty 0.25

## 2012-04-05 MED ORDER — OXYCODONE-ACETAMINOPHEN 5-325 MG PO TABS: 1.0000 | ORAL_TABLET | ORAL | Status: DC | PRN

## 2012-04-05 MED ORDER — ONDANSETRON HCL 4 MG/2ML IJ SOLN
4.0000 mg | Freq: Four times a day (QID) | INTRAMUSCULAR | Status: DC | PRN
  Administered 2012-04-05: 4 mg via INTRAVENOUS
  Filled 2012-04-05: qty 2

## 2012-04-05 MED ORDER — TERBUTALINE SULFATE 1 MG/ML IJ SOLN: 0.2500 mg | Freq: Once | INTRAMUSCULAR | Status: AC | PRN

## 2012-04-05 MED ORDER — ZOLPIDEM TARTRATE 5 MG PO TABS
5.0000 mg | ORAL_TABLET | Freq: Every evening | ORAL | Status: DC | PRN
  Administered 2012-04-06: 5 mg via ORAL
  Filled 2012-04-05: qty 1

## 2012-04-05 MED ORDER — OXYTOCIN BOLUS FROM INFUSION: 500.0000 mL | INTRAVENOUS | Status: DC

## 2012-04-05 MED ORDER — CITRIC ACID-SODIUM CITRATE 334-500 MG/5ML PO SOLN
30.0000 mL | ORAL | Status: DC | PRN
  Administered 2012-04-07: 30 mL via ORAL
  Filled 2012-04-05: qty 15

## 2012-04-05 MED ORDER — LIDOCAINE HCL (PF) 1 % IJ SOLN
30.0000 mL | INTRAMUSCULAR | Status: DC | PRN
  Filled 2012-04-05: qty 30

## 2012-04-05 NOTE — Progress Notes (Signed)
Called Dr. Marcelle Overlie and notified of pt arrival. MD gave induction orders for Cytotec through the night.

## 2012-04-06 ENCOUNTER — Ambulatory Visit (HOSPITAL_COMMUNITY): Payer: Medicaid Other

## 2012-04-06 LAB — RPR: RPR Ser Ql: NONREACTIVE

## 2012-04-06 MED ORDER — TERBUTALINE SULFATE 1 MG/ML IJ SOLN: 0.2500 mg | Freq: Once | INTRAMUSCULAR | Status: AC | PRN

## 2012-04-06 MED ORDER — EPHEDRINE 5 MG/ML INJ
INTRAVENOUS | Status: AC
  Filled 2012-04-06: qty 4

## 2012-04-06 MED ORDER — OXYTOCIN 40 UNITS IN LACTATED RINGERS INFUSION - SIMPLE MED
1.0000 m[IU]/min | INTRAVENOUS | Status: DC
  Administered 2012-04-06: 2 m[IU]/min via INTRAVENOUS

## 2012-04-06 MED ORDER — PHENYLEPHRINE 40 MCG/ML (10ML) SYRINGE FOR IV PUSH (FOR BLOOD PRESSURE SUPPORT): 80.0000 ug | PREFILLED_SYRINGE | INTRAVENOUS | Status: DC | PRN

## 2012-04-06 MED ORDER — EPHEDRINE 5 MG/ML INJ: 10.0000 mg | INTRAVENOUS | Status: DC | PRN

## 2012-04-06 MED ORDER — LACTATED RINGERS IV SOLN
500.0000 mL | Freq: Once | INTRAVENOUS | Status: AC
  Administered 2012-04-06: 500 mL via INTRAVENOUS

## 2012-04-06 MED ORDER — FENTANYL 2.5 MCG/ML BUPIVACAINE 1/10 % EPIDURAL INFUSION (WH - ANES)
INTRAMUSCULAR | Status: AC
  Administered 2012-04-06: 14 mL/h via EPIDURAL
  Filled 2012-04-06: qty 125

## 2012-04-06 MED ORDER — LIDOCAINE HCL (PF) 1 % IJ SOLN
INTRAMUSCULAR | Status: DC | PRN
  Administered 2012-04-06 (×2): 5 mL

## 2012-04-06 MED ORDER — PHENYLEPHRINE 40 MCG/ML (10ML) SYRINGE FOR IV PUSH (FOR BLOOD PRESSURE SUPPORT)
PREFILLED_SYRINGE | INTRAVENOUS | Status: AC
  Filled 2012-04-06: qty 5

## 2012-04-06 MED ORDER — FENTANYL 2.5 MCG/ML BUPIVACAINE 1/10 % EPIDURAL INFUSION (WH - ANES)
14.0000 mL/h | INTRAMUSCULAR | Status: DC
  Administered 2012-04-06 – 2012-04-07 (×4): 14 mL/h via EPIDURAL
  Filled 2012-04-06 (×2): qty 125

## 2012-04-06 MED ORDER — DIPHENHYDRAMINE HCL 50 MG/ML IJ SOLN: 12.5000 mg | INTRAMUSCULAR | Status: DC | PRN

## 2012-04-06 MED ORDER — LORAZEPAM 2 MG/ML IJ SOLN
1.0000 mg | Freq: Once | INTRAMUSCULAR | Status: AC
  Administered 2012-04-06: 1 mg via INTRAVENOUS
  Filled 2012-04-06: qty 1

## 2012-04-06 NOTE — Anesthesia Procedure Notes (Signed)
Epidural Patient location during procedure: OB Start time: 04/06/2012 1:33 PM  Staffing Anesthesiologist: Angus Seller., Harrell Gave. Performed by: anesthesiologist   Preanesthetic Checklist Completed: patient identified, site marked, surgical consent, pre-op evaluation, timeout performed, IV checked, risks and benefits discussed and monitors and equipment checked  Epidural Patient position: sitting Prep: site prepped and draped and DuraPrep Patient monitoring: continuous pulse ox and blood pressure Approach: midline Injection technique: LOR air and LOR saline  Needle:  Needle type: Tuohy  Needle gauge: 17 G Needle length: 9 cm and 9 Needle insertion depth: 5 cm and 11 cm Catheter type: closed end flexible Catheter size: 19 Gauge Catheter at skin depth: 10 and 16 cm Test dose: negative  Assessment Events: blood not aspirated, injection not painful, no injection resistance, negative IV test and no paresthesia  Additional Notes Patient identified.  Risk benefits discussed including failed block, incomplete pain control, headache, nerve damage, paralysis, blood pressure changes, nausea, vomiting, reactions to medication both toxic or allergic, and postpartum back pain.  Patient expressed understanding and wished to proceed.  All questions were answered.  Sterile technique used throughout procedure and epidural site dressed with sterile barrier dressing. No paresthesia or other complications noted.The patient did not experience any signs of intravascular injection such as tinnitus or metallic taste in mouth nor signs of intrathecal spread such as rapid motor block. Please see nursing notes for vital signs.

## 2012-04-06 NOTE — Anesthesia Preprocedure Evaluation (Addendum)
Anesthesia Evaluation  Patient identified by MRN, date of birth, ID band Patient awake    Reviewed: Allergy & Precautions, H&P , Patient's Chart, lab work & pertinent test results  Airway Mallampati: III TM Distance: <3 FB Neck ROM: full    Dental No notable dental hx.    Pulmonary neg pulmonary ROS,  breath sounds clear to auscultation  Pulmonary exam normal       Cardiovascular negative cardio ROS  Rhythm:regular Rate:Normal     Neuro/Psych negative neurological ROS  negative psych ROS   GI/Hepatic negative GI ROS, Neg liver ROS,   Endo/Other  negative endocrine ROSMorbid obesity  Renal/GU negative Renal ROS     Musculoskeletal   Abdominal   Peds  Hematology negative hematology ROS (+)   Anesthesia Other Findings Ear malformation     Scoliosis        Scoliosis     Chronic back pain        Klippel Trenaunay syndrome     Hx of varicella        Hearing loss   60% L ear and 40% right ear Birth defect   L side lims longer than right    Reproductive/Obstetrics (+) Pregnancy                           Anesthesia Physical Anesthesia Plan  ASA: III  Anesthesia Plan: Epidural   Post-op Pain Management:    Induction:   Airway Management Planned:   Additional Equipment:   Intra-op Plan:   Post-operative Plan:   Informed Consent: I have reviewed the patients History and Physical, chart, labs and discussed the procedure including the risks, benefits and alternatives for the proposed anesthesia with the patient or authorized representative who has indicated his/her understanding and acceptance.     Plan Discussed with:   Anesthesia Plan Comments:        Patient comes in today for induction with Scoliosis, Chronic back pain, Klippel Trenaunay syndrome.  Based on pathophysiology of disease, neuraxial anesthesia is not safe without imaging of spine.  If we are able to obtain imaging,  we can make a decision based on spinal involvement.  Anesthesia Quick Evaluation Spoke with rad md and we feel safe that vascular structures are lateral of midline or in Subcutaneous tissue so that clinically dangerous bleeding should be of minimal risk

## 2012-04-06 NOTE — Progress Notes (Signed)
Pt returned from MRI.  Results discussed with radiologist.  Anesthesia and radiology spoke and agreed that epidural placement would be safe.    Pt has received epidural.  Will recheck cervix in 1 hour.  Mitchel Honour, DO

## 2012-04-06 NOTE — Progress Notes (Signed)
After consultation with Anesthesia and discussion with patient, will tx for MRI to evaluate L-spine and assess ability to have neuraxial anesthesia.  Have discussed patient with Radiology.   Also, will T&C x 2u secondary to possibility of hemorrhage with K-T syndrome and possibility of AVMs.  Mitchel Honour, DO

## 2012-04-06 NOTE — Progress Notes (Signed)
Comfortable with epidural. 145. Mod var. +accel. -decel. SVE 5/90/-1 toco q 4-5 min Will add pitocin.  Mitchel Honour, DO

## 2012-04-06 NOTE — H&P (Signed)
Mariah Weaver is a 27 y.o. female presenting for IOL secondary to chronic pain with Klippel Trenaunay Syndrome.  Rare CTX.  +FM.  No LOF.  No VB. Maternal Medical History:  Contractions: Onset was 1 week ago.   Frequency: rare.   Perceived severity is mild.    Fetal activity: Perceived fetal activity is normal.   Last perceived fetal movement was within the past hour.    Prenatal complications: no prenatal complications Prenatal Complications - Diabetes: none.    OB History    Grav Para Term Preterm Abortions TAB SAB Ect Mult Living   1              Past Medical History  Diagnosis Date  . Ear malformation   . Scoliosis   . Scoliosis   . Chronic back pain   . Klippel Trenaunay syndrome   . Hx of varicella   . Hearing loss     60% L ear and 40% right ear  . Birth defect     L side lims longer than right   Past Surgical History  Procedure Date  . Tonsillectomy   . Ear canaloplasty   . Mandible reconstruction   . Leg surgery    Family History: family history includes Heart disease in her father and mother.  She is adopted. Social History:  reports that she has quit smoking. She does not have any smokeless tobacco history on file. She reports that she does not drink alcohol or use illicit drugs.   Prenatal Transfer Tool  Maternal Diabetes: No Genetic Screening: Normal Maternal Ultrasounds/Referrals: Normal Fetal Ultrasounds or other Referrals:  None Maternal Substance Abuse:  No Significant Maternal Medications:  None Significant Maternal Lab Results:  None Other Comments:  None  ROS  Dilation: 1.5 Effacement (%): 60 Station: -2;-3 Exam by:: Chief Technology Officer Blood pressure 99/53, pulse 96, temperature 97.6 F (36.4 C), temperature source Oral, resp. rate 18, height 5\' 4"  (1.626 m), weight 252 lb (114.306 kg), last menstrual period 05/31/2011. Maternal Exam:  Uterine Assessment: Contraction strength is mild.  Contraction frequency is irregular.   Abdomen: Fundal  height is c/w dates.   Estimated fetal weight is 7#12.   Fetal presentation: vertex     Physical Exam  Constitutional: She is oriented to person, place, and time. She appears well-developed and well-nourished.  GI: Soft. She exhibits no distension. There is no tenderness.  Neurological: She is alert and oriented to person, place, and time.  Skin: Skin is warm and dry.  Psychiatric: She has a normal mood and affect. Her behavior is normal.    Prenatal labs: ABO, Rh: --/--/O POS, O POS (01/19 2040) Antibody: NEG (01/19 2040) Rubella: Immune (06/04 0000) RPR: NON REACTIVE (01/19 2040)  HBsAg: Negative (06/04 0000)  HIV: Non-reactive (06/04 0000)  GBS: Negative (12/24 0000)   Assessment/Plan: 26yo G1 at [redacted]w[redacted]d for IOL GBS neg S/P VMP x 2 (last at 5am); will reassess SVE at 0900 Epidural when desired; will have Anesthesia consult secondary to spine abnormalities   Mariah Weaver 04/06/2012, 7:59 AM

## 2012-04-06 NOTE — Progress Notes (Signed)
Notified MD of RN unable to place 2nd dose of cytotec. MD aware. No new orders received.

## 2012-04-07 ENCOUNTER — Encounter (HOSPITAL_COMMUNITY): Payer: Self-pay

## 2012-04-07 ENCOUNTER — Encounter (HOSPITAL_COMMUNITY)
Admission: RE | Disposition: A | Discharge status: Home / Self Care | Payer: Self-pay | Source: Ambulatory Visit | Attending: Obstetrics and Gynecology

## 2012-04-07 ENCOUNTER — Encounter (HOSPITAL_COMMUNITY): Payer: Self-pay | Admitting: Anesthesiology

## 2012-04-07 ENCOUNTER — Inpatient Hospital Stay (HOSPITAL_COMMUNITY): Payer: Medicaid Other | Admitting: Anesthesiology

## 2012-04-07 LAB — CBC
HCT: 29.5 % — ABNORMAL LOW (ref 36.0–46.0)
Hemoglobin: 10 g/dL — ABNORMAL LOW (ref 12.0–15.0)
MCH: 34.2 pg — ABNORMAL HIGH (ref 26.0–34.0)
MCHC: 33.9 g/dL (ref 30.0–36.0)
MCV: 101 fL — ABNORMAL HIGH (ref 78.0–100.0)

## 2012-04-07 SURGERY — Surgical Case
Anesthesia: Epidural | Site: Abdomen | Wound class: Clean Contaminated

## 2012-04-07 MED ORDER — LACTATED RINGERS IV SOLN
INTRAVENOUS | Status: DC
  Administered 2012-04-07: 12:00:00 via INTRAVENOUS

## 2012-04-07 MED ORDER — ACETAMINOPHEN 500 MG PO TABS
1000.0000 mg | ORAL_TABLET | Freq: Four times a day (QID) | ORAL | Status: DC | PRN
  Administered 2012-04-07: 1000 mg via ORAL
  Filled 2012-04-07: qty 2

## 2012-04-07 MED ORDER — MENTHOL 3 MG MT LOZG
1.0000 | LOZENGE | OROMUCOSAL | Status: DC | PRN
  Filled 2012-04-07: qty 9

## 2012-04-07 MED ORDER — SCOPOLAMINE 1 MG/3DAYS TD PT72
MEDICATED_PATCH | TRANSDERMAL | Status: AC
  Administered 2012-04-07: 1.5 mg via TRANSDERMAL
  Filled 2012-04-07: qty 1

## 2012-04-07 MED ORDER — SENNOSIDES-DOCUSATE SODIUM 8.6-50 MG PO TABS
2.0000 | ORAL_TABLET | Freq: Every day | ORAL | Status: DC
  Administered 2012-04-07 – 2012-04-09 (×3): 2 via ORAL

## 2012-04-07 MED ORDER — OXYCODONE-ACETAMINOPHEN 5-325 MG PO TABS
1.0000 | ORAL_TABLET | ORAL | Status: DC | PRN
  Administered 2012-04-08 – 2012-04-09 (×4): 1 via ORAL
  Administered 2012-04-09 (×2): 2 via ORAL
  Filled 2012-04-07: qty 2
  Filled 2012-04-07: qty 1
  Filled 2012-04-07: qty 2
  Filled 2012-04-07 (×3): qty 1

## 2012-04-07 MED ORDER — SODIUM CHLORIDE 0.9 % IR SOLN
Status: DC | PRN
  Administered 2012-04-07: 1000 mL

## 2012-04-07 MED ORDER — MORPHINE SULFATE (PF) 0.5 MG/ML IJ SOLN: INTRAMUSCULAR | Status: DC | PRN

## 2012-04-07 MED ORDER — OXYTOCIN 40 UNITS IN LACTATED RINGERS INFUSION - SIMPLE MED: 62.5000 mL/h | INTRAVENOUS | Status: AC

## 2012-04-07 MED ORDER — SODIUM BICARBONATE 8.4 % IV SOLN
INTRAVENOUS | Status: DC | PRN
  Administered 2012-04-07: 5 mL via EPIDURAL

## 2012-04-07 MED ORDER — SODIUM BICARBONATE 8.4 % IV SOLN
INTRAVENOUS | Status: AC
  Filled 2012-04-07: qty 50

## 2012-04-07 MED ORDER — OXYTOCIN 10 UNIT/ML IJ SOLN
40.0000 [IU] | INTRAVENOUS | Status: DC | PRN
  Administered 2012-04-07: 40 [IU] via INTRAVENOUS

## 2012-04-07 MED ORDER — DIBUCAINE 1 % RE OINT: 1.0000 "application " | TOPICAL_OINTMENT | RECTAL | Status: DC | PRN

## 2012-04-07 MED ORDER — IBUPROFEN 600 MG PO TABS
600.0000 mg | ORAL_TABLET | Freq: Four times a day (QID) | ORAL | Status: DC
  Administered 2012-04-08 – 2012-04-10 (×10): 600 mg via ORAL
  Filled 2012-04-07 (×10): qty 1

## 2012-04-07 MED ORDER — DIPHENHYDRAMINE HCL 25 MG PO CAPS: 25.0000 mg | ORAL_CAPSULE | ORAL | Status: DC | PRN

## 2012-04-07 MED ORDER — HYDROMORPHONE HCL PF 1 MG/ML IJ SOLN
0.2500 mg | INTRAMUSCULAR | Status: DC | PRN
  Administered 2012-04-07 (×2): 0.5 mg via INTRAVENOUS

## 2012-04-07 MED ORDER — ONDANSETRON HCL 4 MG/2ML IJ SOLN
INTRAMUSCULAR | Status: DC | PRN
  Administered 2012-04-07: 4 mg via INTRAVENOUS

## 2012-04-07 MED ORDER — LANOLIN HYDROUS EX OINT: 1.0000 "application " | TOPICAL_OINTMENT | CUTANEOUS | Status: DC | PRN

## 2012-04-07 MED ORDER — SIMETHICONE 80 MG PO CHEW
80.0000 mg | CHEWABLE_TABLET | Freq: Three times a day (TID) | ORAL | Status: DC
  Administered 2012-04-07 – 2012-04-10 (×10): 80 mg via ORAL

## 2012-04-07 MED ORDER — GENTAMICIN SULFATE 40 MG/ML IJ SOLN
120.0000 mg | Freq: Once | INTRAVENOUS | Status: DC
  Filled 2012-04-07: qty 3

## 2012-04-07 MED ORDER — NALBUPHINE HCL 10 MG/ML IJ SOLN
5.0000 mg | INTRAMUSCULAR | Status: DC | PRN
  Filled 2012-04-07: qty 1

## 2012-04-07 MED ORDER — SODIUM CHLORIDE 0.9 % IV SOLN
2.0000 g | Freq: Four times a day (QID) | INTRAVENOUS | Status: DC
  Administered 2012-04-07: 2 g via INTRAVENOUS
  Filled 2012-04-07 (×2): qty 2000

## 2012-04-07 MED ORDER — LACTATED RINGERS IV SOLN
INTRAVENOUS | Status: DC | PRN
  Administered 2012-04-07 (×2): via INTRAVENOUS

## 2012-04-07 MED ORDER — NALOXONE HCL 1 MG/ML IJ SOLN
1.0000 ug/kg/h | INTRAVENOUS | Status: DC | PRN
  Filled 2012-04-07: qty 2

## 2012-04-07 MED ORDER — KETOROLAC TROMETHAMINE 60 MG/2ML IM SOLN
60.0000 mg | Freq: Once | INTRAMUSCULAR | Status: AC | PRN
  Administered 2012-04-07: 60 mg via INTRAMUSCULAR

## 2012-04-07 MED ORDER — MORPHINE SULFATE (PF) 0.5 MG/ML IJ SOLN
INTRAMUSCULAR | Status: DC | PRN
  Administered 2012-04-07: 2 mg via INTRAVENOUS
  Administered 2012-04-07: 3 mg via EPIDURAL

## 2012-04-07 MED ORDER — ONDANSETRON HCL 4 MG/2ML IJ SOLN: 4.0000 mg | INTRAMUSCULAR | Status: DC | PRN

## 2012-04-07 MED ORDER — ONDANSETRON HCL 4 MG/2ML IJ SOLN: 4.0000 mg | Freq: Three times a day (TID) | INTRAMUSCULAR | Status: DC | PRN

## 2012-04-07 MED ORDER — LIDOCAINE-EPINEPHRINE (PF) 2 %-1:200000 IJ SOLN
INTRAMUSCULAR | Status: AC
  Filled 2012-04-07: qty 20

## 2012-04-07 MED ORDER — DIPHENHYDRAMINE HCL 50 MG/ML IJ SOLN: 25.0000 mg | INTRAMUSCULAR | Status: DC | PRN

## 2012-04-07 MED ORDER — TETANUS-DIPHTH-ACELL PERTUSSIS 5-2.5-18.5 LF-MCG/0.5 IM SUSP: 0.5000 mL | Freq: Once | INTRAMUSCULAR | Status: DC

## 2012-04-07 MED ORDER — KETOROLAC TROMETHAMINE 30 MG/ML IJ SOLN
30.0000 mg | Freq: Four times a day (QID) | INTRAMUSCULAR | Status: AC | PRN
  Administered 2012-04-07: 30 mg via INTRAVENOUS
  Filled 2012-04-07: qty 1

## 2012-04-07 MED ORDER — SODIUM CHLORIDE 0.9 % IV SOLN
2.0000 g | Freq: Once | INTRAVENOUS | Status: DC
  Filled 2012-04-07: qty 2000

## 2012-04-07 MED ORDER — KETOROLAC TROMETHAMINE 30 MG/ML IJ SOLN: 15.0000 mg | Freq: Once | INTRAMUSCULAR | Status: DC | PRN

## 2012-04-07 MED ORDER — KETOROLAC TROMETHAMINE 30 MG/ML IJ SOLN: 30.0000 mg | Freq: Four times a day (QID) | INTRAMUSCULAR | Status: AC | PRN

## 2012-04-07 MED ORDER — DIPHENHYDRAMINE HCL 50 MG/ML IJ SOLN: 12.5000 mg | INTRAMUSCULAR | Status: DC | PRN

## 2012-04-07 MED ORDER — MEPERIDINE HCL 25 MG/ML IJ SOLN: 6.2500 mg | INTRAMUSCULAR | Status: DC | PRN

## 2012-04-07 MED ORDER — MORPHINE SULFATE 0.5 MG/ML IJ SOLN
INTRAMUSCULAR | Status: AC
  Filled 2012-04-07: qty 10

## 2012-04-07 MED ORDER — GENTAMICIN SULFATE 40 MG/ML IJ SOLN
2.0000 mg/kg | Freq: Three times a day (TID) | INTRAVENOUS | Status: DC
  Administered 2012-04-07: 228.6 mg via INTRAVENOUS
  Administered 2012-04-07: 228.8 mg via INTRAVENOUS
  Filled 2012-04-07 (×2): qty 5.72

## 2012-04-07 MED ORDER — SODIUM CHLORIDE 0.9 % IJ SOLN: 3.0000 mL | INTRAMUSCULAR | Status: DC | PRN

## 2012-04-07 MED ORDER — ONDANSETRON HCL 4 MG PO TABS: 4.0000 mg | ORAL_TABLET | ORAL | Status: DC | PRN

## 2012-04-07 MED ORDER — KETOROLAC TROMETHAMINE 60 MG/2ML IM SOLN
INTRAMUSCULAR | Status: AC
  Administered 2012-04-07: 60 mg via INTRAMUSCULAR
  Filled 2012-04-07: qty 2

## 2012-04-07 MED ORDER — SIMETHICONE 80 MG PO CHEW: 80.0000 mg | CHEWABLE_TABLET | ORAL | Status: DC | PRN

## 2012-04-07 MED ORDER — ONDANSETRON HCL 4 MG/2ML IJ SOLN
INTRAMUSCULAR | Status: AC
  Filled 2012-04-07: qty 2

## 2012-04-07 MED ORDER — EPHEDRINE SULFATE 50 MG/ML IJ SOLN
INTRAMUSCULAR | Status: DC | PRN
  Administered 2012-04-07 (×4): 10 mg via INTRAVENOUS

## 2012-04-07 MED ORDER — ZOLPIDEM TARTRATE 5 MG PO TABS: 5.0000 mg | ORAL_TABLET | Freq: Every evening | ORAL | Status: DC | PRN

## 2012-04-07 MED ORDER — WITCH HAZEL-GLYCERIN EX PADS: 1.0000 "application " | MEDICATED_PAD | CUTANEOUS | Status: DC | PRN

## 2012-04-07 MED ORDER — MEPERIDINE HCL 25 MG/ML IJ SOLN
INTRAMUSCULAR | Status: DC | PRN
  Administered 2012-04-07 (×2): 12.5 mg via INTRAVENOUS

## 2012-04-07 MED ORDER — METOCLOPRAMIDE HCL 5 MG/ML IJ SOLN: 10.0000 mg | Freq: Three times a day (TID) | INTRAMUSCULAR | Status: DC | PRN

## 2012-04-07 MED ORDER — MEPERIDINE HCL 25 MG/ML IJ SOLN
INTRAMUSCULAR | Status: AC
  Filled 2012-04-07: qty 1

## 2012-04-07 MED ORDER — NALOXONE HCL 0.4 MG/ML IJ SOLN: 0.4000 mg | INTRAMUSCULAR | Status: DC | PRN

## 2012-04-07 MED ORDER — PRENATAL MULTIVITAMIN CH
1.0000 | ORAL_TABLET | Freq: Every day | ORAL | Status: DC
  Administered 2012-04-08 – 2012-04-10 (×3): 1 via ORAL
  Filled 2012-04-07 (×3): qty 1

## 2012-04-07 MED ORDER — HYDROMORPHONE HCL PF 1 MG/ML IJ SOLN
INTRAMUSCULAR | Status: AC
  Administered 2012-04-07: 0.5 mg via INTRAVENOUS
  Filled 2012-04-07: qty 1

## 2012-04-07 MED ORDER — SCOPOLAMINE 1 MG/3DAYS TD PT72
1.0000 | MEDICATED_PATCH | Freq: Once | TRANSDERMAL | Status: AC
  Administered 2012-04-07: 1.5 mg via TRANSDERMAL

## 2012-04-07 MED ORDER — DIPHENHYDRAMINE HCL 25 MG PO CAPS: 25.0000 mg | ORAL_CAPSULE | Freq: Four times a day (QID) | ORAL | Status: DC | PRN

## 2012-04-07 MED ORDER — OXYTOCIN 10 UNIT/ML IJ SOLN
INTRAMUSCULAR | Status: AC
  Filled 2012-04-07: qty 4

## 2012-04-07 MED ORDER — PROMETHAZINE HCL 25 MG/ML IJ SOLN: 6.2500 mg | INTRAMUSCULAR | Status: DC | PRN

## 2012-04-07 MED ORDER — CLINDAMYCIN PHOSPHATE 900 MG/50ML IV SOLN
900.0000 mg | Freq: Once | INTRAVENOUS | Status: AC
  Administered 2012-04-07: 900 mg via INTRAVENOUS
  Filled 2012-04-07: qty 50

## 2012-04-07 SURGICAL SUPPLY — 34 items
ADH SKN CLS APL DERMABOND .7 (GAUZE/BANDAGES/DRESSINGS)
CLOTH BEACON ORANGE TIMEOUT ST (SAFETY) ×2 IMPLANT
DERMABOND ADVANCED (GAUZE/BANDAGES/DRESSINGS)
DERMABOND ADVANCED .7 DNX12 (GAUZE/BANDAGES/DRESSINGS) IMPLANT
DRAPE LG THREE QUARTER DISP (DRAPES) ×2 IMPLANT
DRSG OPSITE POSTOP 4X10 (GAUZE/BANDAGES/DRESSINGS) ×2 IMPLANT
DURAPREP 26ML APPLICATOR (WOUND CARE) ×2 IMPLANT
ELECT REM PT RETURN 9FT ADLT (ELECTROSURGICAL) ×2
ELECTRODE REM PT RTRN 9FT ADLT (ELECTROSURGICAL) ×1 IMPLANT
EXTRACTOR VACUUM M CUP 4 TUBE (SUCTIONS) IMPLANT
GLOVE BIO SURGEON STRL SZ 6 (GLOVE) ×2 IMPLANT
GLOVE BIOGEL PI IND STRL 6 (GLOVE) ×2 IMPLANT
GLOVE BIOGEL PI INDICATOR 6 (GLOVE) ×2
GOWN PREVENTION PLUS LG XLONG (DISPOSABLE) ×6 IMPLANT
GOWN STRL REIN XL XLG (GOWN DISPOSABLE) ×2 IMPLANT
KIT ABG SYR 3ML LUER SLIP (SYRINGE) ×2 IMPLANT
NDL HYPO 25X5/8 SAFETYGLIDE (NEEDLE) ×1 IMPLANT
NEEDLE HYPO 25X5/8 SAFETYGLIDE (NEEDLE) ×2 IMPLANT
NS IRRIG 1000ML POUR BTL (IV SOLUTION) ×2 IMPLANT
PACK C SECTION WH (CUSTOM PROCEDURE TRAY) ×2 IMPLANT
PAD OB MATERNITY 4.3X12.25 (PERSONAL CARE ITEMS) ×2 IMPLANT
RETRACTOR WND ALEXIS 25 LRG (MISCELLANEOUS) IMPLANT
RTRCTR WOUND ALEXIS 25CM LRG (MISCELLANEOUS) ×2
SLEEVE SCD COMPRESS KNEE MED (MISCELLANEOUS) IMPLANT
STAPLER VISISTAT 35W (STAPLE) IMPLANT
SUT CHROMIC 0 CTX 36 (SUTURE) ×6 IMPLANT
SUT MON AB 2-0 CT1 27 (SUTURE) IMPLANT
SUT PDS AB 0 CT1 27 (SUTURE) IMPLANT
SUT PLAIN 0 NONE (SUTURE) IMPLANT
SUT VIC AB 0 CT1 36 (SUTURE) ×2 IMPLANT
SUT VIC AB 4-0 KS 27 (SUTURE) IMPLANT
TOWEL OR 17X24 6PK STRL BLUE (TOWEL DISPOSABLE) ×6 IMPLANT
TRAY FOLEY CATH 14FR (SET/KITS/TRAYS/PACK) IMPLANT
WATER STERILE IRR 1000ML POUR (IV SOLUTION) ×2 IMPLANT

## 2012-04-07 NOTE — Progress Notes (Signed)
Dr. Langston Masker at bedside discussing need for Antibiotics for fever. Also discussing c-section for delivery. Pt would like to discuss with family

## 2012-04-07 NOTE — Progress Notes (Signed)
Called to check on pt. MD updated. No new orders received

## 2012-04-07 NOTE — Op Note (Signed)
Mariah Weaver PROCEDURE DATE: 04/05/2012 - 04/07/2012  PREOPERATIVE DIAGNOSIS: Intrauterine pregnancy at  [redacted]w[redacted]d weeks gestation with arrest of descent  POSTOPERATIVE DIAGNOSIS: The same  PROCEDURE:    Low Transverse Cesarean Section  SURGEON:  Dr. Mitchel Honour  INDICATIONS: Mariah Weaver is a 27 y.o. G1P1001 at [redacted]w[redacted]d scheduled for cesarean section secondary arrest of descent.  The risks of cesarean section discussed with the patient included but were not limited to: bleeding which may require transfusion or reoperation; infection which may require antibiotics; injury to bowel, bladder, ureters or other surrounding organs; injury to the fetus; need for additional procedures including hysterectomy in the event of a life-threatening hemorrhage; placental abnormalities wth subsequent pregnancies, incisional problems, thromboembolic phenomenon and other postoperative/anesthesia complications. The patient concurred with the proposed plan, giving informed written consent for the procedure.    FINDINGS:  Viable female infant in cephalic presentation, APGARs 9,9: weight pending  Meconium stained amniotic fluid.  Intact placenta, three vessel cord.  Grossly normal uterus, ovaries and fallopian tubes. .   ANESTHESIA:    Epidural ESTIMATED BLOOD LOSS:  800 cc SPECIMENS: Placenta sent to path COMPLICATIONS: None immediate  PROCEDURE IN DETAIL:  The patient received intravenous antibiotics and had sequential compression devices applied to her lower extremities while in the preoperative area.  She was then taken to the operating room where pidural anesthesia was dosed up to surgical level nd was found to be adequate. She was then placed in a dorsal supine position with a leftward tilt, and prepped and draped in a sterile manner.  A foley catheter was placed into her bladder and attached to constant gravity.  After an adequate timeout was performed, a Pfannenstiel skin incision was made with scalpel and carried  through to the underlying layer of fascia. The fascia was incised in the midline and this incision was extended bilaterally using the Mayo scissors. Kocher clamps were applied to the superior aspect of the fascial incision and the underlying rectus muscles were dissected off bluntly. A similar process was carried out on the inferior aspect of the facial incision. The rectus muscles were separated in the midline bluntly and the peritoneum was entered bluntly.   After a bladder flap was created, a transverse hysterotomy was made with a scalpel and extended bilaterally bluntly. The bladder blade was then removed. The infant was successfully delivered, and cord was clamped and cut and infant was handed over to awaiting neonatology team. Uterine massage was then administered and the placenta delivered intact with three-vessel cord. The uterus was cleared of clot and debris.  The hysterotomy was closed with #1 Chromic.  A second imbricating suture of #1 Chromic was used to reinforce the incision and aid in hemostasis.  The peritoneum and rectus muscles were noted to be hemostatic.  The peritoneum was closed in a running fashion using 2-0 Monocryl and the rectus muscles imbricated using the same stitich. The fascia was closed with 0-Vicryl in a running fashion with good restoration of anatomy.  The subcutaneus tissue was copiously irrigated and reapproximated using plain gut stitch.  The skin was closed with 2-0 Vicryl in a subcuticular fashion.  Pt tolerated the procedure will.  All counts were correct x2.  Pt went to the recovery room in stable condition.

## 2012-04-07 NOTE — Transfer of Care (Signed)
Immediate Anesthesia Transfer of Care Note  Patient: Mariah Weaver  Procedure(s) Performed: Procedure(s) (LRB) with comments: CESAREAN SECTION (N/A) - Primary Cesarean Section Delivery Baby  Boy  @ [phone],   Apgars 9/9  Patient Location: PACU  Anesthesia Type:Epidural  Level of Consciousness: awake, alert  and oriented  Airway & Oxygen Therapy: Patient Spontanous Breathing  Post-op Assessment: Report given to PACU RN and Post -op Vital signs reviewed and stable  Post vital signs: Reviewed and stable  Complications: No apparent anesthesia complications

## 2012-04-07 NOTE — Anesthesia Postprocedure Evaluation (Signed)
Anesthesia Post Note  Patient: Mariah Weaver  Procedure(s) Performed: Procedure(s) (LRB): CESAREAN SECTION (N/A)  Anesthesia type: Epidural  Patient location: PACU  Post pain: Pain level controlled  Post assessment: Post-op Vital signs reviewed  Last Vitals:  Filed Vitals:   04/07/12 0845  BP: 121/82  Pulse: 115  Temp:   Resp: 20    Post vital signs: Reviewed  Level of consciousness: awake  Complications: No apparent anesthesia complications

## 2012-04-07 NOTE — Addendum Note (Signed)
Addendum  created 04/07/12 1539 by Renford Dills, CRNA   Modules edited:Charges VN, Notes Section

## 2012-04-07 NOTE — Progress Notes (Signed)
Dr. Langston Masker at bedside. Pt desires c-section.  Consents signed. Risk and benefits discussed with pt.

## 2012-04-07 NOTE — Progress Notes (Signed)
UR chart review completed.  

## 2012-04-07 NOTE — Anesthesia Postprocedure Evaluation (Signed)
  Anesthesia Post-op Note  Patient: Mariah Weaver  Procedure(s) Performed: Procedure(s) (LRB) with comments: CESAREAN SECTION (N/A) - Primary Cesarean Section Delivery Baby  Boy  @ [phone],   Apgars 9/9  Patient Location: Mother/Baby  Anesthesia Type:Epidural  Level of Consciousness: awake  Airway and Oxygen Therapy: Patient Spontanous Breathing  Post-op Pain: mild  Post-op Assessment: Patient's Cardiovascular Status Stable and Respiratory Function Stable  Post-op Vital Signs: stable  Complications: No apparent anesthesia complications

## 2012-04-07 NOTE — Progress Notes (Signed)
T ax 100.6 and.  Will start Amp/Gent and Tylenol.  Pt has been pushing effectively x 3.5 hours without descent from c/c/+1-2 with significant caput.  OP presentation.  Attempted to turn but head won't stay.  Recommended C/S secondary to arrest of descent.  Patient's mother asked to speak with the patient alone.  Will return to the room after private discussion.    Mitchel Honour, DO

## 2012-04-07 NOTE — Progress Notes (Signed)
Pt consents to C/S for arrest of descent.  She is informed of the risk of bleeding, infection, blood tx, and life-saving c-hyst.  She understands the risk of damage to surrounding structures including the baby.  She is informed of the implications in future pregnancies.  All questions were answered and she wishes to proceed.    Mitchel Honour, DO

## 2012-04-08 LAB — CBC
HCT: 25.7 % — ABNORMAL LOW (ref 36.0–46.0)
Hemoglobin: 8.5 g/dL — ABNORMAL LOW (ref 12.0–15.0)
MCH: 33.7 pg (ref 26.0–34.0)
MCHC: 33.1 g/dL (ref 30.0–36.0)

## 2012-04-08 LAB — TYPE AND SCREEN
ABO/RH(D): O POS
Unit division: 0

## 2012-04-08 MED ORDER — BENZONATATE 100 MG PO CAPS
200.0000 mg | ORAL_CAPSULE | Freq: Three times a day (TID) | ORAL | Status: DC | PRN
  Administered 2012-04-09 – 2012-04-10 (×2): 200 mg via ORAL
  Filled 2012-04-08 (×3): qty 2

## 2012-04-08 NOTE — Progress Notes (Signed)
Subjective: Postpartum Day 1: Cesarean Delivery Patient reports incisional pain and tolerating PO.  Relief with po meds. Catheter removed this am, has not voided at the time of rounds.  Objective: Vital signs in last 24 hours: Temp:  [97.4 F (36.3 C)-98.8 F (37.1 C)] 97.9 F (36.6 C) (01/22 0618) Pulse Rate:  [96-132] 96  (01/22 0618) Resp:  [14-24] 18  (01/22 0618) BP: (100-134)/(62-95) 100/66 mmHg (01/22 0618) SpO2:  [95 %-100 %] 97 % (01/22 0618)  Physical Exam:  General: alert and cooperative Lochia: appropriate Uterine Fundus: firm Incision: abd dressing noted without drainage  BS+ DVT Evaluation: No evidence of DVT seen on physical exam. Negative Homan's sign. No cords or calf tenderness.   Basename 04/08/12 0515 04/07/12 0910  HGB 8.5* 10.0*  HCT 25.7* 29.5*    Assessment/Plan: Status post Cesarean section. Doing well postoperatively.  Continue current care.  Mariah Weaver G 04/08/2012, 7:48 AM

## 2012-04-09 ENCOUNTER — Encounter (HOSPITAL_COMMUNITY): Payer: Self-pay | Admitting: Obstetrics & Gynecology

## 2012-04-09 MED ORDER — AMOXICILLIN-POT CLAVULANATE 875-125 MG PO TABS
1.0000 | ORAL_TABLET | Freq: Two times a day (BID) | ORAL | Status: DC
  Administered 2012-04-09 – 2012-04-10 (×3): 1 via ORAL
  Filled 2012-04-09 (×3): qty 1

## 2012-04-09 NOTE — Progress Notes (Signed)
Subjective: Postpartum Day 2: Cesarean Delivery Patient reports tolerating PO, + flatus and no problems voiding.    Objective: Vital signs in last 24 hours: Temp:  [97.3 F (36.3 C)-98 F (36.7 C)] 97.3 F (36.3 C) (01/23 0541) Pulse Rate:  [80-108] 80  (01/23 0541) Resp:  [18] 18  (01/23 0541) BP: (89-110)/(54-98) 103/67 mmHg (01/23 0541)  Physical Exam:  General: alert and cooperative Lochia: appropriate Uterine Fundus: firm Incision: erythema and edema noted superior to incisional site. Scant old blood noted from midpoint of the incision, no active bleeding DVT Evaluation: No evidence of DVT seen on physical exam. No cords or calf tenderness. No significant calf/ankle edema.   Basename 04/08/12 0515 04/07/12 0910  HGB 8.5* 10.0*  HCT 25.7* 29.5*    Assessment/Plan: Status post Cesarean section. Postoperative course complicated by incisional cellulitis  Augmentin 875 mg bid.  Rubina Basinski G 04/09/2012, 7:44 AM

## 2012-04-10 MED ORDER — OXYCODONE-ACETAMINOPHEN 5-325 MG PO TABS: 1.0000 | ORAL_TABLET | ORAL | Status: DC | PRN

## 2012-04-10 MED ORDER — IBUPROFEN 600 MG PO TABS: 600.0000 mg | ORAL_TABLET | Freq: Four times a day (QID) | ORAL | Status: DC

## 2012-04-10 MED ORDER — BENZONATATE 200 MG PO CAPS: 200.0000 mg | ORAL_CAPSULE | Freq: Three times a day (TID) | ORAL | Status: DC | PRN

## 2012-04-10 MED ORDER — AMOXICILLIN-POT CLAVULANATE 875-125 MG PO TABS: 1.0000 | ORAL_TABLET | Freq: Two times a day (BID) | ORAL | Status: DC

## 2012-04-10 NOTE — Discharge Summary (Signed)
Obstetric Discharge Summary Reason for Admission: induction of labor Prenatal Procedures: ultrasound Intrapartum Procedures: cesarean: low cervical, transverse Postpartum Procedures: antibiotics Complications-Operative and Postpartum: wound infection Hemoglobin  Date Value Range Status  04/08/2012 8.5* 12.0 - 15.0 g/dL Final     HCT  Date Value Range Status  04/08/2012 25.7* 36.0 - 46.0 % Final    Physical Exam:  General: alert and cooperative Lochia: appropriate Uterine Fundus: firm Incision: erythema and edema continue superior to incisional site. Honeycomb dressing intact with small old drainage noted on bandage. No active bleeding or drainage DVT Evaluation: No evidence of DVT seen on physical exam. No cords or calf tenderness Lungs clear to auscultation( patient had reported non- productive cough during the night)  Discharge Diagnoses: Term Pregnancy-delivered  Discharge Information: Date: 04/10/2012 Activity: pelvic rest Diet: routine Medications: PNV, Ibuprofen, Percocet and tessalon and Augmentin Condition: stable Instructions: refer to practice specific booklet Discharge to: home   Newborn Data: Live born female  Birth Weight: 7 lb 2.3 oz (3240 g) APGAR: 9, 9  Home with mother.  Amilyah Nack G 04/10/2012, 7:49 AM

## 2012-04-17 ENCOUNTER — Encounter (HOSPITAL_COMMUNITY): Payer: Self-pay | Admitting: *Deleted

## 2012-04-17 ENCOUNTER — Inpatient Hospital Stay (HOSPITAL_COMMUNITY)
Admission: AD | Admit: 2012-04-17 | Discharge: 2012-04-17 | Disposition: A | Discharge status: Home / Self Care | Payer: Medicaid Other | Source: Ambulatory Visit | Attending: Obstetrics & Gynecology | Admitting: Obstetrics & Gynecology

## 2012-04-17 DIAGNOSIS — T8189XA Other complications of procedures, not elsewhere classified, initial encounter: Secondary | ICD-10-CM

## 2012-04-17 DIAGNOSIS — O9279 Other disorders of lactation: Secondary | ICD-10-CM

## 2012-04-17 DIAGNOSIS — O909 Complication of the puerperium, unspecified: Secondary | ICD-10-CM | POA: Insufficient documentation

## 2012-04-17 NOTE — Progress Notes (Signed)
Lactation Consultation Note  Patient Name: Lonita Debes Rezek Today's Date: 04/17/2012   [facility] called to see this patient who is 10 days pp and c/o baby being extremely fussy after breastfeedings for past 2 days and she has irritated/cracked nipples.  She reports having breast engorgement around 5 days pp and has exclusively breastfed since delivery.  Baby had lost down to 6 # 2 oz at pediatrician follow-up but has gained 4 oz since then. He is having frequent yellow stools after most feedings.  LC assessed mom's nipples for soreness and noted some superficial cracks at base and irritation across tips. She does not want to pump or put baby to breast and is considering stopping breastfeeding or combining breast and formula feedings.  Husband and MGM are with her and willing to assist as needed.  LC provided comfort gelpads for nipple care, and also reviewed breast care to reduce milk production with ice packs (15-20 minutes at a time) and/or cabbage leaves (change every few hours) to breasts and avoid any stimulation.  If combining with formula feeding, teach baby to open mouth wide before he grasps bottle nipple and ensure deep latch to breast at each feeding.  Mom is still undecided so LC reminded her of OP services, if she is still trying to breastfeed and experiencing nipple soreness or baby not satisfied.  Maternal Data    Feeding    LATCH Score/Interventions           unable to assess (mom wants to go home from MAU and will formula-feed, if desired           Lactation Tools Discussed/Used   Comfort gelpads, LC OP resources  Consult Status    Mom to call [facility] OP office, as needed  Lynda Rainwater 04/17/2012, 7:46 PM

## 2012-04-17 NOTE — MAU Note (Signed)
Patient states she had a primary cesarean section on 1-21. States she had some leaking from the incision and the left breast. Patient states she is on Augmentin for abdominal redness since 1-22.

## 2012-04-17 NOTE — MAU Provider Note (Signed)
History     CSN: [id number]  Arrival date and time: 04/17/12 1754   First Provider Initiated Contact with Patient 04/17/12 1834      Chief Complaint  Patient presents with  . Drainage from Incision   HPI Mariah Weaver is 27 y.o. G1P1001 presents with draining from her C-Section incision.  Describes as blood tinged liquid discharge.  Denies fever and chills.  She delivered 04/07/12 by Dr. Langston Masker.  Also having difficulty with bleeding from the nipples and baby doesn't appear to be satisfied.  Saw Dr. Marcelle Overlie 1 week Post partum and was told her skin was red, placed on Augmentin has taken all but 2 tabs.      Past Medical History  Diagnosis Date  . Ear malformation   . Scoliosis   . Scoliosis   . Chronic back pain   . Klippel Trenaunay syndrome   . Hx of varicella   . Hearing loss     60% L ear and 40% right ear  . 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 Tonsillectomy   . Ear canaloplasty   . Mandible reconstruction   . Leg surgery   . Cesarean section 04/07/2012    Procedure: CESAREAN SECTION;  Surgeon: Mitchel Honour, DO;  Location: [facility] ORS;  Service: Obstetrics;  Laterality: N/A;  Primary Cesarean Section Delivery Baby  Boy  @ [phone],   Apgars 9/9    Family History  Problem Relation Age of Onset  . Adopted: Yes  . Heart disease Mother   . Heart disease Father     History  Substance Use Topics  . Smoking status: Former Games developer  . Smokeless tobacco: Not on file  . Alcohol Use: No    Allergies:  Allergies  Allergen Reactions  . Sulfamethoxazole Hives and Itching    Prescriptions prior to admission  Medication Sig Dispense Refill  . acetaminophen (TYLENOL) 325 MG tablet Take 650 mg by mouth every 6 (six) hours as needed. For pain      . benzonatate (TESSALON) 200 MG capsule Take 1 capsule (200 mg total) by mouth every 8 (eight) hours as needed for cough.  20 capsule  0  . ibuprofen (ADVIL,MOTRIN) 600 MG tablet  Take 1 tablet (600 mg total) by mouth every 6 (six) hours.  30 tablet  1  . oxyCODONE-acetaminophen (PERCOCET/ROXICET) 5-325 MG per tablet Take 1-2 tablets by mouth every 4 (four) hours as needed (moderate - severe pain).  30 tablet  0  . Prenatal Vit-Fe Fumarate-FA (PRENATAL MULTIVITAMIN) TABS Take 1 tablet by mouth 2 (two) times daily as needed. Depending on the way the patient feels        Review of Systems  Constitutional: Negative for fever and chills.  Gastrointestinal: Negative for nausea, vomiting and abdominal pain.   Physical Exam   Blood pressure 122/68, pulse 81, temperature 97.9 F (36.6 C), temperature source Oral, resp. rate 18, last menstrual period 05/31/2011, currently breastfeeding.  Physical Exam  Constitutional: She is oriented to person, place, and time. She appears well-developed and well-nourished. No distress.  HENT:  Head: Normocephalic.  Neck: Normal range of motion.  Cardiovascular: Normal rate.   Respiratory: Effort normal.  GI: Soft. She exhibits no distension and no mass. There is no tenderness. There is no rebound and no guarding.       C-Section incision appears to be healing well.  No signs of active  bleeding or discharge from the wound. No signs of infection   Appropriately tender.  The lower right abdomen is reddened without tenderness.    Neurological: She is alert and oriented to person, place, and time.  Skin: Skin is warm and dry.  Psychiatric: She has a normal mood and affect. Her behavior is normal.    MAU Course  Procedures  MDM 18:45 Called Dr. Langston Masker to report PHI and difficulty with breast feeding.  Dr. Langston Masker reports the patient has an hyperdilatation syndrome and the redness has been noted before the C-Section.  Order given for her to complete antibiotic and keep appointment in the office for Tuesday.  Lactation consult has been requested. 19:30  Lactation consultant in to see patient.  Reports the patient is ready to go home.  WIll  discharge  Assessment and Plan  A:  C-Section incision healing well      Difficulty with breastfeeding  P:  Was seen by Lactation consultant       Patient instructed to complete antibiotic     Keep appointment for Tuesday     Call doctor on call if symptoms worsen. Khalia Gong,EVE M 04/17/2012, 6:35 PM

## 2013-07-05 ENCOUNTER — Emergency Department (HOSPITAL_COMMUNITY)
Admission: EM | Admit: 2013-07-05 | Discharge: 2013-07-05 | Disposition: A | Discharge status: Home / Self Care | Payer: Medicaid Other | Source: Home / Self Care

## 2013-07-20 ENCOUNTER — Inpatient Hospital Stay (HOSPITAL_COMMUNITY)
Admission: AD | Admit: 2013-07-20 | Discharge: 2013-07-20 | Disposition: A | Discharge status: Home / Self Care | Payer: Self-pay | Source: Ambulatory Visit | Attending: Obstetrics & Gynecology | Admitting: Obstetrics & Gynecology

## 2013-07-20 ENCOUNTER — Encounter (HOSPITAL_COMMUNITY): Payer: Self-pay | Admitting: Obstetrics and Gynecology

## 2013-07-20 DIAGNOSIS — Z3202 Encounter for pregnancy test, result negative: Secondary | ICD-10-CM

## 2013-07-20 LAB — POCT PREGNANCY, URINE: PREG TEST UR: NEGATIVE

## 2013-07-20 NOTE — MAU Provider Note (Signed)
HPI:  [name] is a 28 y.o. female G1P1001 who presents for a pregnancy test. The patient has not taken a pregnancy test at home, however feels like she has all of the symptoms of pregnancy. The patient currently has a nexplanon that was placed by Dr. Rana SnareLowe 1 year ago. Pt denies vaginal bleeding.    Objective:  GENERAL: Well-developed, well-nourished female in no acute distress.  HEENT: Normocephalic, atraumatic.   LUNGS: Effort normal HEART: Regular rate  SKIN: Warm, dry and without erythema PSYCH: Normal mood and affect  Filed Vitals:   07/20/13 1414  BP: 123/78  Pulse: 76  Temp: 99.1 F (37.3 C)  Resp: 20   Results for orders placed during the hospital encounter of 07/20/13 (from the past 48 hour(s))  POCT PREGNANCY, URINE     Status: None   Collection Time    07/20/13  1:58 PM      Result Value Ref Range   Preg Test, Ur NEGATIVE  NEGATIVE   Comment:            THE SENSITIVITY OF THIS     METHODOLOGY IS >24 mIU/mL    MDM Urine pregnancy test negative  Pt informed of results. Pt was very upset stating that she knows she is pregnant. I informed her that I would not do any other work up for pregnancy at this time given the negative pregnancy test and pts current birth control method. I offered the patient referral to the GYN clinic for GYN care and patient declined. I offered the patient to been seen in MAU for workup of other GYN symptoms and patient declined.   A:  Pregnancy encounter with results negative  P:  Discharge home  If pregnancy is still of concern in one week take a home pregnancy test.  Iona HansenJennifer Irene Rasch, NP 07/20/2013 2:42 PM

## 2013-07-20 NOTE — MAU Provider Note (Signed)
Attestation of Attending Supervision of Advanced Practitioner (CNM/NP): Evaluation and management procedures were performed by the Advanced Practitioner under my supervision and collaboration.  I have reviewed the Advanced Practitioner's note and chart, and I agree with the management and plan.  Shanterica Biehler Harraway-Smith 4:44 PM     

## 2013-07-20 NOTE — MAU Note (Addendum)
feeling like I am preg again.  Back pain, bloated, over eating, ankles are swelling, face is breaking out, irritable, moody, exhaustion, burning with urination, a lot of discharge.  Neg home test thinks " nexplanon is blocking it"

## 2013-11-21 ENCOUNTER — Encounter (HOSPITAL_COMMUNITY): Payer: Self-pay

## 2013-11-21 ENCOUNTER — Inpatient Hospital Stay (HOSPITAL_COMMUNITY)
Admission: AD | Admit: 2013-11-21 | Discharge: 2013-11-21 | Disposition: A | Discharge status: Home / Self Care | Payer: Self-pay | Source: Ambulatory Visit | Attending: Obstetrics & Gynecology | Admitting: Obstetrics & Gynecology

## 2013-11-21 DIAGNOSIS — Z3202 Encounter for pregnancy test, result negative: Secondary | ICD-10-CM

## 2013-11-21 DIAGNOSIS — N6459 Other signs and symptoms in breast: Secondary | ICD-10-CM

## 2013-11-21 DIAGNOSIS — Z87891 Personal history of nicotine dependence: Secondary | ICD-10-CM | POA: Insufficient documentation

## 2013-11-21 LAB — WET PREP, GENITAL
Clue Cells Wet Prep HPF POC: NONE SEEN
Trich, Wet Prep: NONE SEEN
YEAST WET PREP: NONE SEEN

## 2013-11-21 LAB — URINE MICROSCOPIC-ADD ON

## 2013-11-21 LAB — URINALYSIS, ROUTINE W REFLEX MICROSCOPIC
BILIRUBIN URINE: NEGATIVE
Glucose, UA: NEGATIVE mg/dL
Ketones, ur: NEGATIVE mg/dL
NITRITE: NEGATIVE
PH: 5.5 (ref 5.0–8.0)
Protein, ur: NEGATIVE mg/dL
SPECIFIC GRAVITY, URINE: 1.025 (ref 1.005–1.030)
Urobilinogen, UA: 0.2 mg/dL (ref 0.0–1.0)

## 2013-11-21 LAB — HCG, QUANTITATIVE, PREGNANCY: hCG, Beta Chain, Quant, S: <1 m[IU]/mL (ref <5)

## 2013-11-21 NOTE — MAU Provider Note (Signed)
History     CSN: [id number]  Arrival date and time: 11/21/13 1620   None     Chief Complaint  Patient presents with  . Breast Discharge   HPI  [name] is a 28 y.o. female G1P1001 who presents with multiple pregnancy like complaints. Pt had a negative pregnancy test however is certain that she is pregnant because she is experiencing back pain, lower abdominal pain, vaginal discharge (that she gets when she is pregnant) and lactating breasts.  She has the nexplanon; since March 2014. Dr. Rana Weaver placed it and the patient has not been back to the office since.   Pt presents complaining of breast lactation. States she feels very pregnant. States she "ate almost a whole jar of pickles", has sore breasts that lactate when stimulated, and feels pregnant. Denies vaginal bleeding. Complaining of a lot of white discharge with an odor like the last time she was pregnant. Pt had negative pregnancy test at home.         OB History   Grav Para Term Preterm Abortions TAB SAB Ect Mult Living   Past Medical History  Diagnosis Date  . Ear malformation   . Scoliosis   . Scoliosis   . Chronic back pain   . Klippel Trenaunay syndrome   . Hx of varicella   . Hearing loss     60% L ear and 40% right ear  . 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 Tonsillectomy    . Ear canaloplasty    . Mandible reconstruction    . Leg surgery    . Cesarean section  04/07/2012    Procedure: CESAREAN SECTION;  Surgeon: Mariah Honour, DO;  Location: [facility];  Service: Obstetrics;  Laterality: N/A;  Primary Cesarean Section Delivery Baby  Boy  @ [phone],   Apgars 9/9    Family History  Problem Relation Age of Onset  . Adopted: Yes  . Heart disease Mother   . Heart disease Father     History  Substance Use Topics  . Smoking status: Former Games developer  . Smokeless tobacco: Not on file  . Alcohol Use: No    Allergies:   Allergies  Allergen Reactions  . Sulfamethoxazole Hives and Itching    Prescriptions prior to admission  Medication Sig Dispense Refill  . acetaminophen (TYLENOL) 325 MG tablet Take 650 mg by mouth every 6 (six) hours as needed. For pain      . amoxicillin-clavulanate (AUGMENTIN) 875-125 MG per tablet Take 1 tablet by mouth every 12 (twelve) hours. Pt started on 04/10/12, to take for 7 days      . benzonatate (TESSALON) 200 MG capsule Take 1 capsule (200 mg total) by mouth every 8 (eight) hours as needed for cough.  20 capsule  0  . ibuprofen (ADVIL,MOTRIN) 600 MG tablet Take 1 tablet (600 mg total) by mouth every 6 (six) hours.  30 tablet  1  . oxyCODONE-acetaminophen (PERCOCET/ROXICET) 5-325 MG per tablet Take 1-2 tablets by mouth every 4 (four) hours as needed (moderate - severe pain).  30 tablet  0  . Prenatal Vit-Fe Fumarate-FA (PRENATAL MULTIVITAMIN) TABS Take 1 tablet by mouth 2 (two) times daily as needed. Depending on the way the patient feels       Results for orders placed during  the hospital encounter of 11/21/13 (from the past 48 hour(s))  URINALYSIS, ROUTINE W REFLEX MICROSCOPIC     Status: Abnormal   Collection Time    11/21/13  4:51 PM      Result Value Ref Range   Color, Urine YELLOW  YELLOW   APPearance CLEAR  CLEAR   Specific Gravity, Urine 1.025  1.005 - 1.030   pH 5.5  5.0 - 8.0   Glucose, UA NEGATIVE  NEGATIVE mg/dL   Hgb urine dipstick LARGE (*) NEGATIVE   Bilirubin Urine NEGATIVE  NEGATIVE   Ketones, ur NEGATIVE  NEGATIVE mg/dL   Protein, ur NEGATIVE  NEGATIVE mg/dL   Urobilinogen, UA 0.2  0.0 - 1.0 mg/dL   Nitrite NEGATIVE  NEGATIVE   Leukocytes, UA SMALL (*) NEGATIVE  URINE MICROSCOPIC-ADD ON     Status: Abnormal   Collection Time    11/21/13  4:51 PM      Result Value Ref Range   Squamous Epithelial / LPF FEW (*) RARE   WBC, UA 0-2  <3 WBC/hpf   RBC / HPF 11-20  <3 RBC/hpf   Bacteria, UA FEW (*) RARE  HCG, QUANTITATIVE, PREGNANCY     Status: None    Collection Time    11/21/13  5:40 PM      Result Value Ref Range   hCG, Beta Chain, Quant, S <1  <5 mIU/mL   Comment:              GEST. AGE      CONC.  (mIU/mL)       <=1 WEEK        5 - 50         2 WEEKS       50 - 500         3 WEEKS       100 - 10,000         4 WEEKS     1,000 - 30,000         5 WEEKS     3,500 - 115,000       6-8 WEEKS     12,000 - 270,000        12 WEEKS     15,000 - 220,000                FEMALE AND NON-PREGNANT FEMALE:         LESS THAN 5 mIU/mL     REPEATED TO VERIFY  WET PREP, GENITAL     Status: Abnormal   Collection Time    11/21/13  6:55 PM      Result Value Ref Range   Yeast Wet Prep HPF POC NONE SEEN  NONE SEEN   Trich, Wet Prep NONE SEEN  NONE SEEN   Clue Cells Wet Prep HPF POC NONE SEEN  NONE SEEN   WBC, Wet Prep HPF POC FEW (*) NONE SEEN   Comment: MODERATE BACTERIA SEEN    Review of Systems  Constitutional: Positive for chills. Negative for fever.  Gastrointestinal: Positive for nausea, abdominal pain (Bilateral lower abdominal pain ), diarrhea and constipation. Negative for vomiting.  Genitourinary: Negative for dysuria, urgency, frequency and hematuria.       + vaginal discharge; white, thick with an odor  No vaginal bleeding. No dysuria.    Physical Exam   Blood pressure 126/80, pulse 79, temperature 98.1 F (36.7 C), temperature source Oral, resp. rate 18, currently breastfeeding.  Physical Exam  Constitutional: She is  oriented to person, place, and time. She appears well-developed and well-nourished. No distress.  HENT:  Head: Normocephalic.  Eyes: Pupils are equal, round, and reactive to light.  Neck: Neck supple.  Respiratory: Effort normal. Right breast exhibits no inverted nipple and no nipple discharge. Left breast exhibits no inverted nipple and no nipple discharge.  GI: Soft. She exhibits no distension. There is no tenderness.  Genitourinary:  Speculum exam: Vagina - Small amount of creamy discharge, no odor Cervix -  No contact bleeding Bimanual exam: Cervix closed Uterus non tender, normal size Adnexa non tender, no masses bilaterally, + suprapubic tenderness  GC/Chlam, wet prep done Chaperone present for exam.   Musculoskeletal: Normal range of motion.  Neurological: She is alert and oriented to person, place, and time.  Skin: Skin is warm. She is not diaphoretic.  Psychiatric: Her behavior is normal.    MAU Course  Procedures Now  MDM UA Urine pregnancy test negative Beta hcg level negative Wet prep GC  Assessment and Plan   A:  Encounter for pregnancy test with results negative   P:  Discharge home in stable condition  Follow up with Dr. Rana Weaver; call to make an appointment; or call the clinic to schedule an appointment for GYN care Return to MAU for emergencies only.  Iona Hansen Rasch, NP 11/21/2013, 6:24 PM

## 2013-11-21 NOTE — Discharge Instructions (Signed)
Human Chorionic Gonadotropin (hCG) This is a test to confirm and monitor pregnancy or to diagnose trophoblastic disease or germ cell tumors. As early as 10 days after a missed menstrual period (some methods can detect hCG even earlier, at one week after conception) or if your caregiver thinks that your symptoms suggest ectopic pregnancy, a failing pregnancy, trophoblastic disease, or germ cell tumors. hCG is a protein produced in the placenta of a pregnant woman. A pregnancy test is a specific blood or urine test that can detect hCG and confirm pregnancy. This hormone is able to be detected 10 days after a missed menstrual period, the time period when the fertilized egg is implanted in the woman's uterus. With some methods, hCG can be detected even earlier, at one week after conception.  During the early weeks of pregnancy, hCG is important in maintaining function of the corpus luteum (the mass of cells that forms from a mature egg). Production of hCG increases steadily during the first trimester (8-10 weeks), peaking around the 10th week after the last menstrual cycle. Levels then fall slowly during the remainder of the pregnancy. hCG is no longer detectable within a few weeks after delivery. hCG is also produced by some germ cell tumors and increased levels are seen in trophoblastic disease. SAMPLE COLLECTION hCG is commonly detected in urine. The preferred specimen is a random urine sample collected first thing in the morning. hCG can also be measured in blood drawn from a vein in the arm. NORMAL FINDINGS Qualitative:negative in non-pregnant women; positive in pregnancy Quantitative:   Gestation less than 1 week: 5-50 Whole HCG (milli-international units/mL)  Gestation of 2 weeks: 50-500 Whole HCG (milli-international units/mL)  Gestation of 3 weeks: 100-10,000 Whole HCG (milli-international units/mL)  Gestation of 4 weeks: 1,000-30,000 Whole HCG (milli-international units/mL)  Gestation of 5  weeks 3,500-115,000 Whole HCG (milli-international units/mL)  Gestation of 6-8 weeks: 12,000-270,000 Whole HCG (milli-international units/mL)  Gestation of 12 weeks: 15,000-220,000 Whole HCG (milli-international units/mL)  Males and non-pregnant females: less than 5 Whole HCG (milli-international units/mL) Beta subunit: depends on the method and test used Ranges for normal findings may vary among different laboratories and hospitals. You should always check with your doctor after having lab work or other tests done to discuss the meaning of your test results and whether your values are considered within normal limits. MEANING OF TEST  Your caregiver will go over the test results with you and discuss the importance and meaning of your results, as well as treatment options and the need for additional tests if necessary. OBTAINING THE TEST RESULTS It is your responsibility to obtain your test results. Ask the lab or department performing the test when and how you will get your results. Document Released: 04/05/2004 Document Revised: 05/27/2011 Document Reviewed: 06/07/2013 [facility] Patient Information 2015 [place], [place]. This information is not intended to replace advice given to you by your health care provider. Make sure you discuss any questions you have with your health care provider.  Pregnancy Tests HOW DO PREGNANCY TESTS WORK? All pregnancy tests look for a special hormone in the urine or blood that is only present in pregnant women. This hormone, human chorionic gonadotropin (hCG), is also called the pregnancy hormone.  WHAT IS THE DIFFERENCE BETWEEN A URINE AND A BLOOD PREGNANCY TEST? IS ONE BETTER THAN THE OTHER? There are two types of pregnancy tests.  Blood tests.  Urine tests. Both tests look for the presence of hCG, the pregnancy hormone. Many women use a urine  test or home pregnancy test (HPT) to find out if they are pregnant. HPTs are cheap, easy to use, can be done at home,  and are private. When a woman has a positive result on an HPT, she needs to see her caregiver right away. The caregiver can confirm a positive HPT result with another urine test, a blood test, ultrasound, and a pelvic exam.  There are two types of blood tests you can get from a caregiver.   A quantitative blood test (or the beta hCG test). This test measures the exact amount of hCG in the blood. This means it can pick up very small amounts of hCG, making it a very accurate test.  A qualitative hCG blood test. This test gives a simple yes or no answer to whether you are pregnant. This test is more like a urine test in terms of its accuracy. Blood tests can pick up hCG earlier in a pregnancy than urine tests can. Blood tests can tell if you are pregnant about 6 to 8 days after you release an egg from an ovary (ovulate). Urine tests can determine pregnancy about 2 weeks after ovulation.  HOW IS A HOME PREGNANCY TEST DONE?  There are many types of home pregnancy tests or HPTs that can be bought over-the-counter at drug or discount stores.   Some involve collecting your urine in a cup and dipping a stick into the urine or putting some of the urine into a special container with an eyedropper.  Others are done by placing a stick into your urine stream.  Tests vary in how long you need to wait for the stick or container to turn a certain color or have a symbol on it (like a plus or a minus).  All tests come with written instructions. Most tests also have toll-free phone numbers to call if you have any questions about how to do the test or read the results. HOW ACCURATE ARE HOME PREGNANCY TESTS?  HPTs are very accurate. Most brands of HPTs say they are 97% to 99% accurate when taken 1 week after missing your menstrual period, but this can vary with actual use. Each brand varies in how sensitive it is in picking up the pregnancy hormone hCG. If a test is not done correctly, it will be less accurate. Always  check the package to make sure it is not past its expiration date. If it is, it will not be accurate. Most brands of HPTs tell users to do the test again in a few days, no matter what the results.  If you use an HPT too early in your pregnancy, you may not have enough of the pregnancy hormone hCG in your urine to have a positive test result. Most HPTs will be accurate if you test yourself around the time your period is due (about 2 weeks after you ovulate). You can get a negative test result if you are not pregnant or if you ovulated later than you thought you did. You may also have problems with the pregnancy, which affects the amount of hCG you have in your urine. If your HPT is negative, test yourself again within a few days to 1 week. If you keep getting a negative result and think you are pregnant, talk with your caregiver right away about getting a blood pregnancy test.  FALSE POSITIVE PREGNANCY TEST A false positive HPT can happen if there is blood or protein present in your urine. A false positive can also happen if  you were recently pregnant or if you take a pregnancy test too soon after taking fertility drug that contains hCG. Also, some prescription medicines such as water pills (diuretics), tranquilizers, seizure medicines, psychiatric medicines, and allergy and nausea medicines (promethazine) give false positive readings. FALSE NEGATIVE PREGNANCY TEST  A false negative HPT can happen if you do the test too early. Try to wait until you are at least 1 day late for your menstrual period.  It may happen if you wait too long to test the urine (longer than 15 minutes).  It may also happen if the urine is too diluted because you drank a lot of fluids before getting the urine sample. It is best to test the first morning urine after you get out of bed. If your menstrual period did not start after a week of a negative HPT, repeat the pregnancy test. CAN ANYTHING INTERFERE WITH HOME PREGNANCY TEST  RESULTS?  Most medicines, both over-the-counter and prescription drugs, including birth control pills and antibiotics, should not affect the results of a HPT. Only those drugs that have the pregnancy hormone hCG in them can give a false positive test result. Drugs that have hCG in them may be used for treating infertility (not being able to get pregnant). Alcohol and illegal drugs do not affect HPT results, but you should not be using these substances if you are trying to get pregnant. If you have a positive pregnancy test, call your caregiver to make an appointment to begin prenatal care. Document Released: 03/07/2003 Document Revised: 05/27/2011 Document Reviewed: 06/18/2013 [facility] Patient Information 2015 [place], [place]. This information is not intended to replace advice given to you by your health care provider. Make sure you discuss any questions you have with your health care provider.

## 2013-11-21 NOTE — MAU Note (Signed)
Pt presents complaining of breast lactation. States she feels very pregnant. States she "ate almost a whole jar of pickles", has sore breasts that lactate when stimulated, and feels pregnant. Denies vaginal bleeding. Complaining of a lot of white discharge with an odor like the last time she was pregnant. Pt had negative pregnancy test at home.

## 2013-11-22 NOTE — MAU Provider Note (Signed)
Attestation of Attending Supervision of Advanced Practitioner (CNM/NP): Evaluation and management procedures were performed by the Advanced Practitioner under my supervision and collaboration. I have reviewed the Advanced Practitioner's note and chart, and I agree with the management and plan.  Diasia Henken H. 5:42 AM

## 2013-11-23 LAB — GC/CHLAMYDIA PROBE AMP
CT PROBE, AMP APTIMA: NEGATIVE
GC PROBE AMP APTIMA: NEGATIVE

## 2014-01-17 ENCOUNTER — Encounter (HOSPITAL_COMMUNITY): Payer: Self-pay

## 2014-01-26 IMAGING — US US RENAL
1 series · 14 of 25 positions shown · non-contrast
Comparison: Prior pelvic ultrasound 06/13/2011

CLINICAL DATA: Flank pain; hematuria, 32 weeks pregnant

RENAL/URINARY TRACT ULTRASOUND COMPLETE

[Series 1: us renal · 14 of 26 slices shown]
[im 1/26]
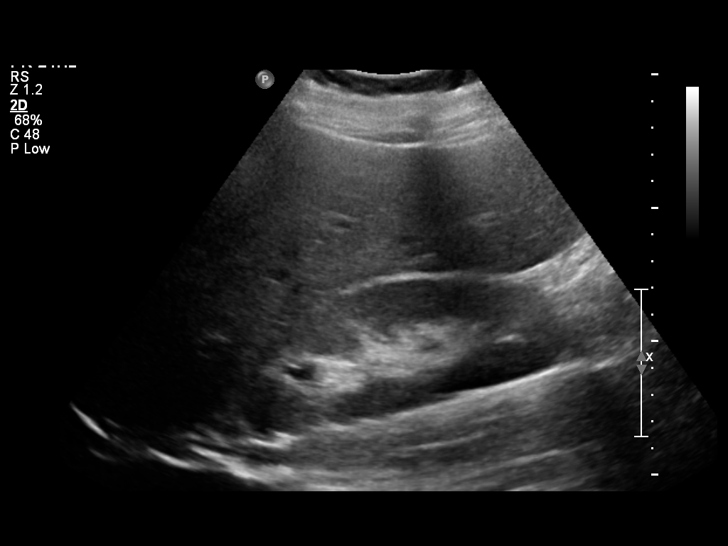
[im 3/26]
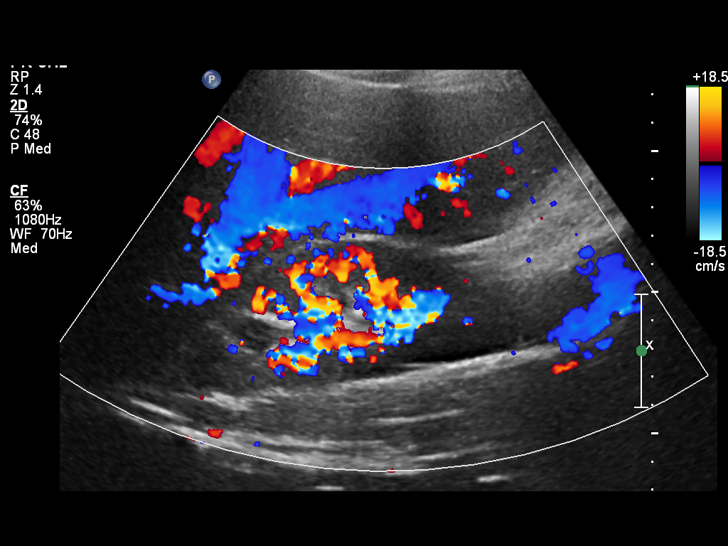
[im 5/26]
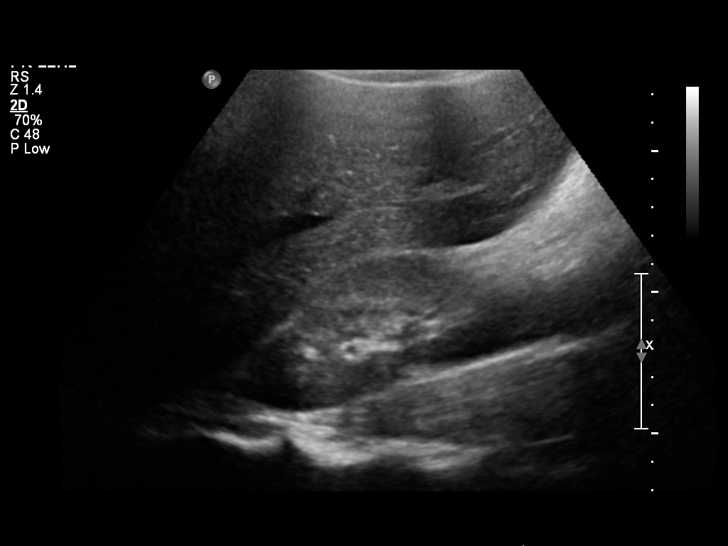
[im 7/26]
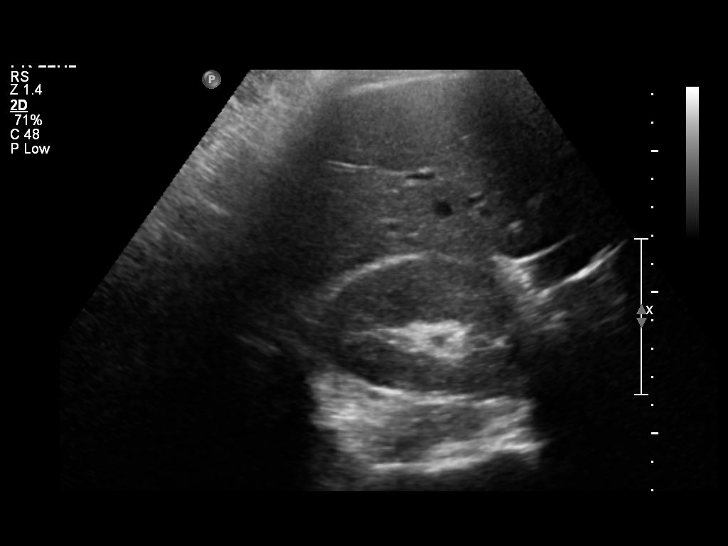
[im 9/26]
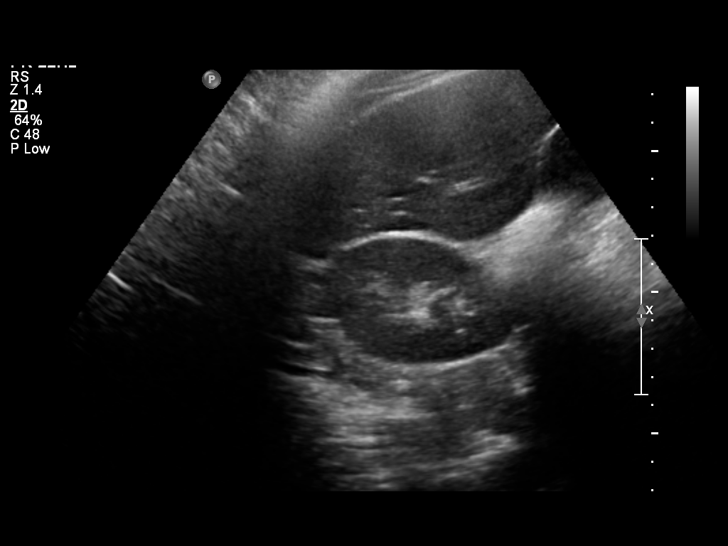
[im 10/26]
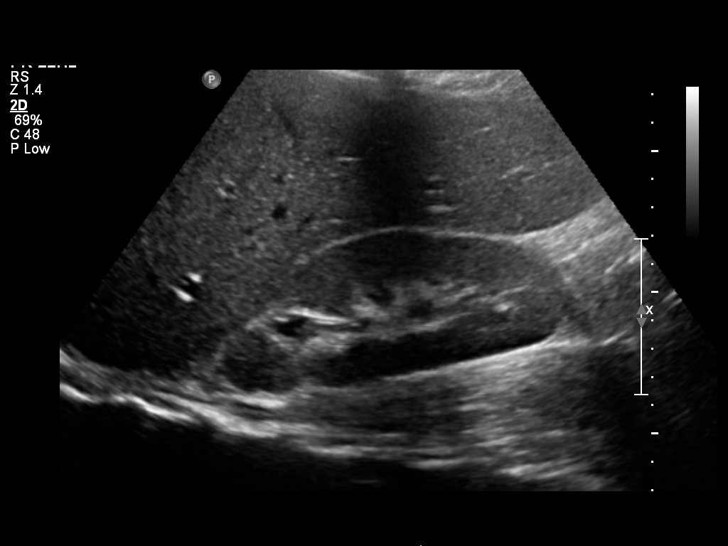
[im 12/26]
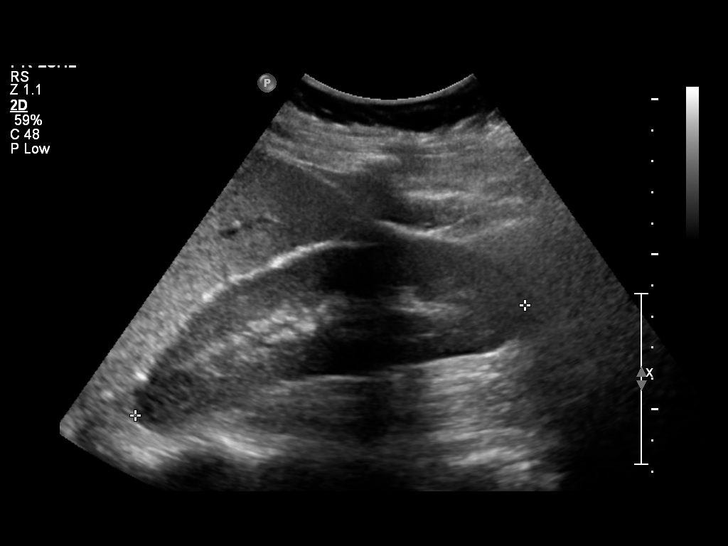
[im 14/26]
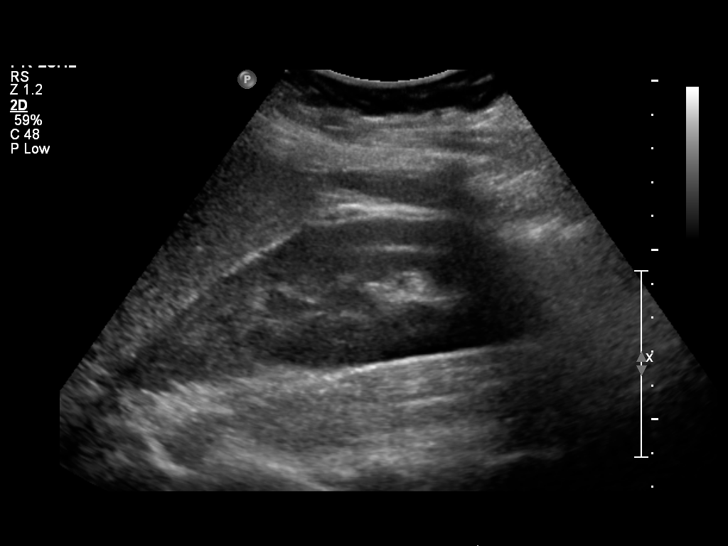
[im 16/26]
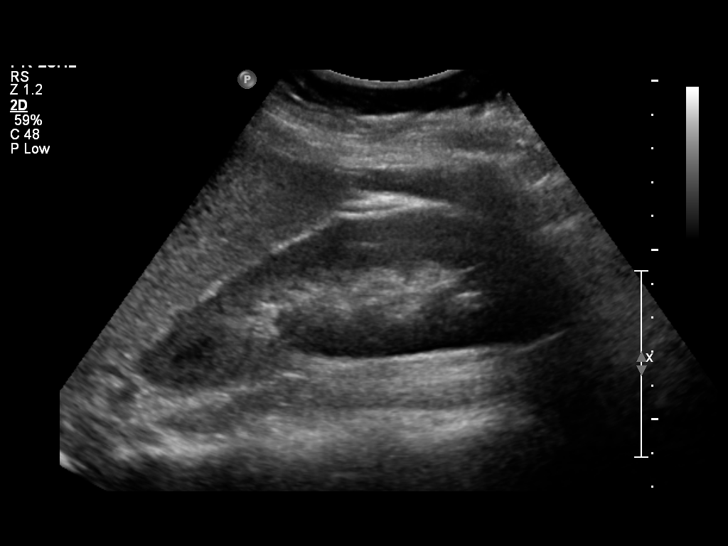
[im 17/26]
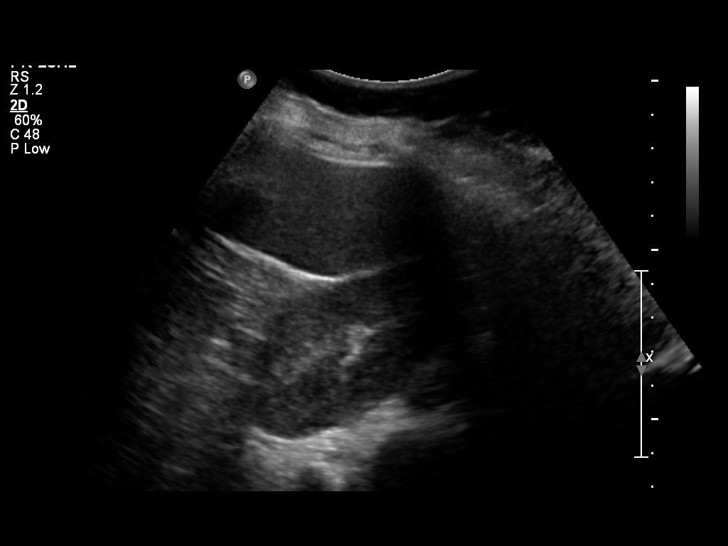
[im 19/26]
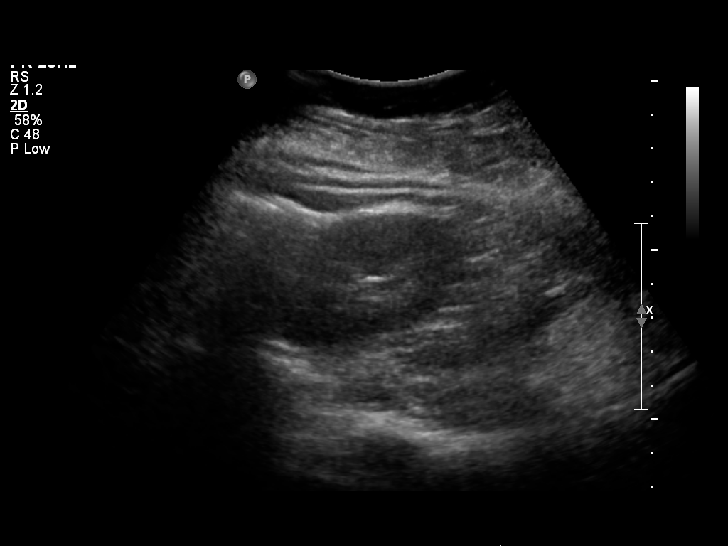
[im 21/26]
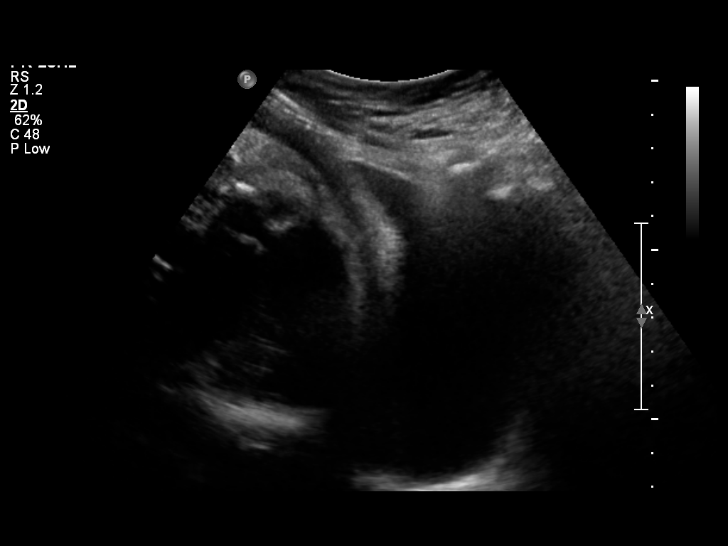
[im 23/26]
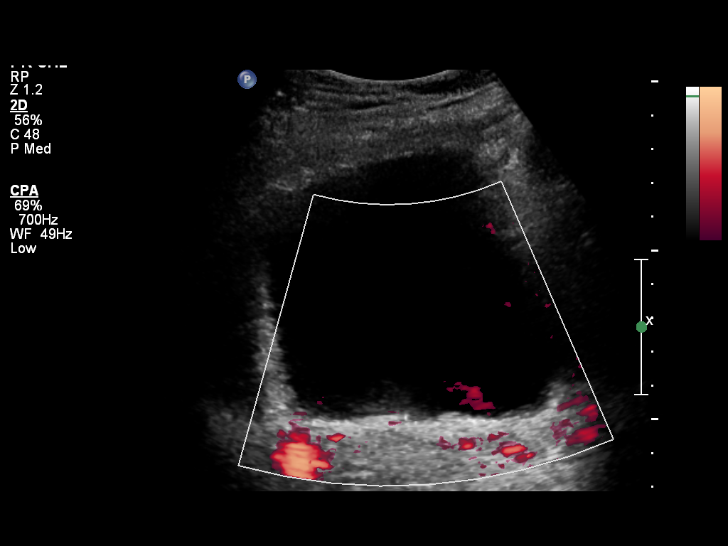
[im 26/26]
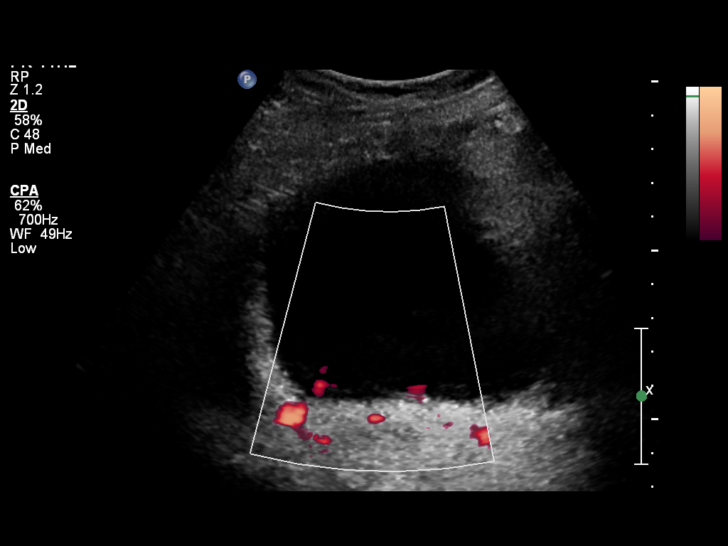

[14 of 25 positions shown; findings below may reference images not displayed]

FINDINGS: Right Kidney:  Normal in size (12.6 cm) and parenchymal
echogenicity.  Minimal caliectasis.  There is no overt
hydronephrosis.

Left Kidney:  Normal in size (13 cm) and parenchymal echogenicity.
No evidence of mass or hydronephrosis.

Bladder:  Appears normal for degree of bladder distention.
Bilateral ureteral jets are identified.
IMPRESSION: 1.  Very mild right-sided caliectasis without overt hydronephrosis.
2.  Unremarkable sonographic appearance of the left kidney.
3.  Bilateral ureteral jets identified in the bladder.

## 2014-07-19 ENCOUNTER — Emergency Department (HOSPITAL_COMMUNITY)
Admission: EM | Admit: 2014-07-19 | Discharge: 2014-07-19 | Disposition: A | Discharge status: Home / Self Care | Payer: Medicaid Other | Attending: Emergency Medicine | Admitting: Emergency Medicine

## 2014-07-19 ENCOUNTER — Encounter (HOSPITAL_COMMUNITY): Payer: Self-pay

## 2014-07-19 DIAGNOSIS — G8929 Other chronic pain: Secondary | ICD-10-CM | POA: Insufficient documentation

## 2014-07-19 DIAGNOSIS — M419 Scoliosis, unspecified: Secondary | ICD-10-CM | POA: Insufficient documentation

## 2014-07-19 DIAGNOSIS — M542 Cervicalgia: Secondary | ICD-10-CM

## 2014-07-19 DIAGNOSIS — R591 Generalized enlarged lymph nodes: Secondary | ICD-10-CM

## 2014-07-19 DIAGNOSIS — R590 Localized enlarged lymph nodes: Secondary | ICD-10-CM | POA: Insufficient documentation

## 2014-07-19 DIAGNOSIS — Z87891 Personal history of nicotine dependence: Secondary | ICD-10-CM | POA: Insufficient documentation

## 2014-07-19 DIAGNOSIS — H9193 Unspecified hearing loss, bilateral: Secondary | ICD-10-CM | POA: Insufficient documentation

## 2014-07-19 DIAGNOSIS — H6692 Otitis media, unspecified, left ear: Secondary | ICD-10-CM

## 2014-07-19 DIAGNOSIS — R1012 Left upper quadrant pain: Secondary | ICD-10-CM

## 2014-07-19 DIAGNOSIS — Z9889 Other specified postprocedural states: Secondary | ICD-10-CM | POA: Insufficient documentation

## 2014-07-19 DIAGNOSIS — Z3202 Encounter for pregnancy test, result negative: Secondary | ICD-10-CM | POA: Insufficient documentation

## 2014-07-19 LAB — URINALYSIS, ROUTINE W REFLEX MICROSCOPIC
BILIRUBIN URINE: NEGATIVE
Glucose, UA: NEGATIVE mg/dL
Ketones, ur: NEGATIVE mg/dL
Leukocytes, UA: NEGATIVE
Nitrite: NEGATIVE
Protein, ur: NEGATIVE mg/dL
SPECIFIC GRAVITY, URINE: 1.008 (ref 1.005–1.030)
UROBILINOGEN UA: 0.2 mg/dL (ref 0.0–1.0)
pH: 7.5 (ref 5.0–8.0)

## 2014-07-19 LAB — URINE MICROSCOPIC-ADD ON

## 2014-07-19 LAB — POC URINE PREG, ED: Preg Test, Ur: NEGATIVE

## 2014-07-19 MED ORDER — AMOXICILLIN-POT CLAVULANATE 875-125 MG PO TABS: 1.0000 | ORAL_TABLET | Freq: Two times a day (BID) | ORAL | Status: DC

## 2014-07-19 MED ORDER — IBUPROFEN 400 MG PO TABS
600.0000 mg | ORAL_TABLET | Freq: Once | ORAL | Status: AC
  Administered 2014-07-19: 600 mg via ORAL
  Filled 2014-07-19 (×2): qty 1

## 2014-07-19 NOTE — ED Notes (Signed)
Warm pack given

## 2014-07-19 NOTE — ED Notes (Signed)
Pt. Has had a cough, flu-like symptoms for 2 weeks.  Lt. Lateral  Neck has a lump noted under her skin and beside it is a red area .  Lt. Ear is impacted with wax.  Pt. Also has had n/v with a kidney infection and has been treating with  cranberry juice.  GCS 15, Skin is p/w/d  Pt. Denies any n/v/d.  Pt. Denies any chest pain or sob

## 2014-07-19 NOTE — ED Notes (Signed)
The pt is c/o feeling worse.  She has 2 ahead of her

## 2014-07-19 NOTE — ED Provider Notes (Signed)
CSN: [id number]     Arrival date & time 07/19/14  1518 History   First MD Initiated Contact with Patient 07/19/14 1759     Chief Complaint  Patient presents with  . Neck Pain     (Consider location/radiation/quality/duration/timing/severity/associated sxs/prior Treatment) Patient is a 29 y.o. female presenting with neck injury. The history is provided by the patient. No language interpreter was used.  Neck Injury This is a new problem. The current episode started in the past 7 days. The problem occurs constantly. The problem has been gradually worsening. Associated symptoms include abdominal pain (left flank), neck pain (left lateral) and urinary symptoms. Pertinent negatives include no anorexia, arthralgias, chest pain, chills, congestion, coughing, fatigue, fever, headaches, myalgias, nausea, rash, sore throat, vertigo, visual change, vomiting or weakness. Nothing aggravates the symptoms. She has tried nothing for the symptoms.    Past Medical History  Diagnosis Date  . Ear malformation   . Scoliosis   . Scoliosis   . Chronic back pain   . Klippel Trenaunay syndrome   . Hx of varicella   . Hearing loss     60% L ear and 40% right ear  . 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 Tonsillectomy    . Ear canaloplasty    . Mandible reconstruction    . Leg surgery    . Cesarean section  04/07/2012    Procedure: CESAREAN SECTION;  Surgeon: Mitchel HonourMegan Morris, DO;  Location: [facility];  Service: Obstetrics;  Laterality: N/A;  Primary Cesarean Section Delivery Baby  Boy  @ [phone],   Apgars 9/9   Family History  Problem Relation Age of Onset  . Adopted: Yes  . Heart disease Mother   . Heart disease Father    History  Substance Use Topics  . Smoking status: Former Games developermoker  . Smokeless tobacco: Not on file  . Alcohol Use: No   OB History    Gravida Para Term Preterm AB TAB SAB Ectopic Multiple Living   1 1 1       1      Review  of Systems  Constitutional: Negative for fever, chills and fatigue.  HENT: Positive for ear pain (left). Negative for congestion, dental problem, facial swelling, hearing loss, rhinorrhea, sore throat, trouble swallowing and voice change.   Respiratory: Negative for cough, chest tightness and shortness of breath.   Cardiovascular: Negative for chest pain.  Gastrointestinal: Positive for abdominal pain (left flank). Negative for nausea, vomiting, constipation and anorexia.  Genitourinary: Positive for flank pain. Negative for dysuria, urgency, frequency and hematuria.  Musculoskeletal: Positive for neck pain (left lateral). Negative for myalgias and arthralgias.  Skin: Negative for rash.  Neurological: Negative for vertigo, weakness, light-headedness and headaches.  Psychiatric/Behavioral: Negative for confusion.  All other systems reviewed and are negative.     Allergies  Sulfamethoxazole  Home Medications   Prior to Admission medications   Medication Sig Start Date End Date Taking? Authorizing Provider  acetaminophen (TYLENOL) 325 MG tablet Take 650 mg by mouth every 6 (six) hours as needed. For pain   Yes Historical Provider, MD  benzonatate (TESSALON) 200 MG capsule Take 1 capsule (200 mg total) by mouth every 8 (eight) hours as needed for cough. Patient not taking: Reported on 07/19/2014 04/10/12   Julio Sicksarol Curtis, NP  ibuprofen (ADVIL,MOTRIN) 600 MG tablet Take 1 tablet (600 mg total) by mouth every 6 (six) hours. Patient  not taking: Reported on 07/19/2014 04/10/12   Julio Sicks, NP  oxyCODONE-acetaminophen (PERCOCET/ROXICET) 5-325 MG per tablet Take 1-2 tablets by mouth every 4 (four) hours as needed (moderate - severe pain). Patient not taking: Reported on 07/19/2014 04/10/12   Julio Sicks, NP   BP 114/51 mmHg  Pulse 52  Temp(Src) 97.6 F (36.4 C) (Oral)  Resp 17  Ht 5' 4.5" (1.638 m)  Wt 236 lb (107.049 kg)  BMI 39.90 kg/m2  SpO2 100% Physical Exam  Constitutional: She appears  well-developed and well-nourished. No distress.  HENT:  Head: Normocephalic and atraumatic.  Nose: Nose normal.  Mouth/Throat: Oropharynx is clear and moist. No oropharyngeal exudate.  Left TM with scarring from previous surgeries, left upper quadrant TM patch noted.  Fluid noted behind the TM and patient had mild tenderness to palpation.  Right TM benign.   Eyes: EOM are normal. Pupils are equal, round, and reactive to light.  Neck: Normal range of motion. Neck supple.    Cardiovascular: Normal rate, regular rhythm, normal heart sounds and intact distal pulses.   No murmur heard. Pulmonary/Chest: Effort normal and breath sounds normal. No respiratory distress. She has no wheezes. She exhibits no tenderness.  Abdominal: Soft. There is no tenderness. There is no rebound and no guarding.  Musculoskeletal: Normal range of motion. She exhibits no tenderness.  Lymphadenopathy:    She has cervical adenopathy.  Neurological: She is alert. No cranial nerve deficit. Coordination normal.  Skin: Skin is warm and dry. She is not diaphoretic.  Psychiatric: She has a normal mood and affect. Her behavior is normal. Judgment and thought content normal.  Nursing note and vitals reviewed.   ED Course  Procedures (including critical care time) Labs Review Labs Reviewed  URINALYSIS, ROUTINE W REFLEX MICROSCOPIC - Abnormal; Notable for the following:    Hgb urine dipstick TRACE (*)    All other components within normal limits  URINE MICROSCOPIC-ADD ON  POC URINE PREG, ED    Imaging Review No results found.   EKG Interpretation None      MDM   Final diagnoses:  Neck pain on left side  Acute left otitis media, recurrence not specified, unspecified otitis media type  Lymphadenopathy of head and neck  LUQ pain   Pt is a 29 yo F who presents with left lateral neck pain and intermittent LUQ pain. Has a remote hx of several left TM surgeries as a child, but no recent ear infections.  Complains  of several days of left ear pain/fullness.  Then developed swelling at her left lateral neck that is consistent with reactive lymphadenopathy on exam.  Noted that the swelling increased so presented for evaluation today.  She denies URI sx, no midline neck pain or stiffness, afebrile.  Left TM with changes consistent with acute otitis media.  Right lateral neck lymph node that is mildly tender to palpation.  No overlying erythema or warmth, no fluctuance.  No midline cervical spine tenderness, full ROM.   Likely just a reactive lymph node.  Advised motrin and warm compresses.  Will treat her left AOM with augmentin.  Discussed ED return precautions if she develops cellulitis, painful swallowing, difficulty breathing, increased swelling over the area, or if it doesn't resolve in the next week.    Also complains of intermittent left flank/LUQ abd pain x 1 month.  No pain today.  Not associated with PO intake, movement, or palpation.  Occasionally feels worse after urinating.  Has a hx of stones but  this feels different.  No associated nausea, dysuria, hematuria, change in PO intake, or back pain.  No hx of GERD or pancreatitis.   Will get urine to evaluate.  As she is asymptomatic here today, do not believe she needs emergent imaging.   Urine returned benign.  No signs of infection.  Advised to return if her sx become increasingly bothersome.  Advised increased PO fluids.     Lenell Antu, MD 07/20/14 1610  Nelva Nay, MD 07/27/14 [phone]

## 2014-07-19 NOTE — ED Notes (Signed)
The pt is now c/o nausea per her husband

## 2014-07-20 NOTE — Care Management (Signed)
ED CM received call from patient  concerning not being able to afford prescription for amoxicillin she received from the ED.  ED CM discussed goodrx medication discount card, price $14.00  offered to text card to patients smart phone, patient agreeable. No further ED CM needs identified.

## 2014-09-27 ENCOUNTER — Encounter (HOSPITAL_COMMUNITY): Payer: Self-pay | Admitting: Emergency Medicine

## 2014-09-27 ENCOUNTER — Emergency Department (HOSPITAL_COMMUNITY)
Admission: EM | Admit: 2014-09-27 | Discharge: 2014-09-27 | Disposition: A | Discharge status: Home / Self Care | Payer: Medicaid Other | Attending: Emergency Medicine | Admitting: Emergency Medicine

## 2014-09-27 ENCOUNTER — Emergency Department (HOSPITAL_COMMUNITY): Payer: Medicaid Other

## 2014-09-27 DIAGNOSIS — H9190 Unspecified hearing loss, unspecified ear: Secondary | ICD-10-CM | POA: Insufficient documentation

## 2014-09-27 DIAGNOSIS — R5383 Other fatigue: Secondary | ICD-10-CM | POA: Insufficient documentation

## 2014-09-27 DIAGNOSIS — Z87891 Personal history of nicotine dependence: Secondary | ICD-10-CM | POA: Insufficient documentation

## 2014-09-27 DIAGNOSIS — J40 Bronchitis, not specified as acute or chronic: Secondary | ICD-10-CM

## 2014-09-27 DIAGNOSIS — R109 Unspecified abdominal pain: Secondary | ICD-10-CM | POA: Insufficient documentation

## 2014-09-27 DIAGNOSIS — Z8619 Personal history of other infectious and parasitic diseases: Secondary | ICD-10-CM | POA: Insufficient documentation

## 2014-09-27 DIAGNOSIS — J069 Acute upper respiratory infection, unspecified: Secondary | ICD-10-CM

## 2014-09-27 DIAGNOSIS — M2550 Pain in unspecified joint: Secondary | ICD-10-CM | POA: Insufficient documentation

## 2014-09-27 DIAGNOSIS — R42 Dizziness and giddiness: Secondary | ICD-10-CM | POA: Insufficient documentation

## 2014-09-27 DIAGNOSIS — Z87721 Personal history of (corrected) congenital malformations of ear: Secondary | ICD-10-CM | POA: Insufficient documentation

## 2014-09-27 DIAGNOSIS — J209 Acute bronchitis, unspecified: Secondary | ICD-10-CM | POA: Insufficient documentation

## 2014-09-27 DIAGNOSIS — R110 Nausea: Secondary | ICD-10-CM | POA: Insufficient documentation

## 2014-09-27 DIAGNOSIS — R51 Headache: Secondary | ICD-10-CM | POA: Insufficient documentation

## 2014-09-27 DIAGNOSIS — Z8776 Personal history of (corrected) congenital malformations of integument, limbs and musculoskeletal system: Secondary | ICD-10-CM | POA: Insufficient documentation

## 2014-09-27 DIAGNOSIS — M419 Scoliosis, unspecified: Secondary | ICD-10-CM | POA: Insufficient documentation

## 2014-09-27 DIAGNOSIS — G8929 Other chronic pain: Secondary | ICD-10-CM | POA: Insufficient documentation

## 2014-09-27 LAB — CBC WITH DIFFERENTIAL/PLATELET
BASOS PCT: 0 % (ref 0–1)
Basophils Absolute: 0 10*3/uL (ref 0.0–0.1)
EOS ABS: 0.2 10*3/uL (ref 0.0–0.7)
Eosinophils Relative: 3 % (ref 0–5)
HEMATOCRIT: 42.3 % (ref 36.0–46.0)
HEMOGLOBIN: 14.3 g/dL (ref 12.0–15.0)
LYMPHS ABS: 1.9 10*3/uL (ref 0.7–4.0)
Lymphocytes Relative: 25 % (ref 12–46)
MCH: 32.4 pg (ref 26.0–34.0)
MCHC: 33.8 g/dL (ref 30.0–36.0)
MCV: 95.7 fL (ref 78.0–100.0)
MONOS PCT: 12 % (ref 3–12)
Monocytes Absolute: 0.9 10*3/uL (ref 0.1–1.0)
NEUTROS PCT: 60 % (ref 43–77)
Neutro Abs: 4.4 10*3/uL (ref 1.7–7.7)
PLATELETS: 230 10*3/uL (ref 150–400)
RBC: 4.42 MIL/uL (ref 3.87–5.11)
RDW: 12.6 % (ref 11.5–15.5)
WBC: 7.4 10*3/uL (ref 4.0–10.5)

## 2014-09-27 LAB — BASIC METABOLIC PANEL
Anion gap: 7 (ref 5–15)
BUN: 8 mg/dL (ref 6–20)
CALCIUM: 9.4 mg/dL (ref 8.9–10.3)
CHLORIDE: 105 mmol/L (ref 101–111)
CO2: 25 mmol/L (ref 22–32)
Creatinine, Ser: 0.68 mg/dL (ref 0.44–1.00)
GFR calc Af Amer: >60 mL/min (ref >60)
GFR calc non Af Amer: >60 mL/min (ref >60)
Glucose, Bld: 89 mg/dL (ref 65–99)
POTASSIUM: 3.4 mmol/L — AB (ref 3.5–5.1)
Sodium: 137 mmol/L (ref 135–145)

## 2014-09-27 MED ORDER — DEXTROMETHORPHAN HBR 15 MG/5ML PO SYRP: 10.0000 mL | ORAL_SOLUTION | Freq: Four times a day (QID) | ORAL | Status: DC | PRN

## 2014-09-27 MED ORDER — LEVOFLOXACIN 750 MG PO TABS: 750.0000 mg | ORAL_TABLET | Freq: Every day | ORAL | Status: DC

## 2014-09-27 NOTE — ED Provider Notes (Signed)
CSN: [id number]     Arrival date & time 09/27/14  1523 History  This chart was scribed for non-[id number] practitioner Danelle BerryLeisa Zuri Lascala, PA-C working with Gilda Creasehristopher J Pollina, MD by Lyndel SafeKaitlyn Shelton, ED Scribe. This patient was seen in room WTR8/WTR8 and the patient's care was started at 6:02 PM.  Chief Complaint  Patient presents with  . Cough  . Headache   Patient is a 29 y.o. female presenting with cough and headaches. The history is provided by the patient. No language interpreter was used.  Cough Cough characteristics:  Productive Sputum characteristics:  White Severity:  Moderate Onset quality:  Gradual Duration:  2 days Timing:  Constant Progression:  Worsening Chronicity:  New Smoker: no   Context: sick contacts   Relieved by:  Nothing Worsened by:  Lying down Ineffective treatments:  Decongestant Associated symptoms: chills, headaches, rhinorrhea, shortness of breath and wheezing   Associated symptoms: no fever   Headache Associated symptoms: abdominal pain, back pain, cough, fatigue and nausea   Associated symptoms: no congestion, no fever and no vomiting     HPI Comments: Mariah Weaver is a 29 y.o. female, with bilateral tympanostomy tubes, who presents to the Emergency Department complaining of a gradually worsening productive cough with white sputum onset 2 days with associated 10/10 chest pain/ rib cage pain upon coughing and mild SOB. She reports the cough is exacerbated at night when she is lying down. PT has tried Mucinex and an OTC decongestant with tylenol with mild to no relief. Pt also reports associated URI symptoms of chills, wheezing, rhinorrhea, fatigue, malaise, light-headedness, and nausea onset 1 week. She has not checked her fever at home. She also reports bilateral side abdominal pain and lower back pain. Pt notes she lives with her father who was diagnosed with PNA several days ago. Pt additionally complains of an 'off balance' feeling when she is ambulating.  She denies any associated palpitations, fever, vomiting, syncope or LE swelling. Moreover she denies a history of smoking, any recent travel, history of asthma, or oral contraception ( pt with Nexplanon implant). Pt notes left LE swelling at baseline. Her father has a history of blood clots.   Past Medical History  Diagnosis Date  . Ear malformation   . Scoliosis   . Scoliosis   . Chronic back pain   . Klippel Trenaunay syndrome   . Hx of varicella   . Hearing loss     60% L ear and 40% right ear  . 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 Tonsillectomy    . Ear canaloplasty    . Mandible reconstruction    . Leg surgery    . Cesarean section  04/07/2012    Procedure: CESAREAN SECTION;  Surgeon: Mitchel HonourMegan Morris, DO;  Location: [facility];  Service: Obstetrics;  Laterality: N/A;  Primary Cesarean Section Delivery Baby  Boy  @ [phone],   Apgars 9/9   Family History  Problem Relation Age of Onset  . Adopted: Yes  . Heart disease Mother   . Heart disease Father    History  Substance Use Topics  . Smoking status: Former Games developermoker  . Smokeless tobacco: Not on file  . Alcohol Use: No   OB History    Gravida Para Term Preterm AB TAB SAB Ectopic Multiple Living   1 1 1       1      Review of  Systems  Constitutional: Positive for chills and fatigue. Negative for fever.  HENT: Positive for rhinorrhea. Negative for congestion.   Respiratory: Positive for cough, shortness of breath and wheezing.   Cardiovascular: Negative for palpitations and leg swelling.  Gastrointestinal: Positive for nausea and abdominal pain. Negative for vomiting.  Musculoskeletal: Positive for back pain and arthralgias.  Neurological: Positive for light-headedness and headaches. Negative for syncope.   Allergies  Sulfamethoxazole  Home Medications   Prior to Admission medications   Medication Sig Start Date End Date Taking? Authorizing Provider   Phenyleph-CPM-DM-APAP (TYLENOL COLD MULTI-SYMPTOM PO) Take 2 tablets by mouth every 8 (eight) hours as needed (cold symptoms).   Yes Historical Provider, MD  amoxicillin-clavulanate (AUGMENTIN) 875-125 MG per tablet Take 1 tablet by mouth 2 (two) times daily. Patient not taking: Reported on 09/27/2014 07/19/14   Lenell Antu, MD  benzonatate (TESSALON) 200 MG capsule Take 1 capsule (200 mg total) by mouth every 8 (eight) hours as needed for cough. Patient not taking: Reported on 07/19/2014 04/10/12   Julio Sicks, NP  dextromethorphan 15 MG/5ML syrup Take 10 mLs (30 mg total) by mouth 4 (four) times daily as needed for cough. 09/27/14   Danelle Berry, PA-C  ibuprofen (ADVIL,MOTRIN) 600 MG tablet Take 1 tablet (600 mg total) by mouth every 6 (six) hours. Patient not taking: Reported on 07/19/2014 04/10/12   Julio Sicks, NP  levofloxacin (LEVAQUIN) 750 MG tablet Take 1 tablet (750 mg total) by mouth daily. 09/27/14   Danelle Berry, PA-C  oxyCODONE-acetaminophen (PERCOCET/ROXICET) 5-325 MG per tablet Take 1-2 tablets by mouth every 4 (four) hours as needed (moderate - severe pain). Patient not taking: Reported on 07/19/2014 04/10/12   Julio Sicks, NP   BP 146/75 mmHg  Pulse 88  Temp(Src) 98.2 F (36.8 C) (Oral)  Resp 18  SpO2 97% Physical Exam  Constitutional: She is oriented to person, place, and time. She appears well-developed and well-nourished.  Non-toxic appearance. No distress.  HENT:  Head: Normocephalic and atraumatic.  Nose: Nose normal.  Mouth/Throat: Oropharynx is clear and moist. No oropharyngeal exudate.  Eyes: Conjunctivae, EOM and lids are normal. Pupils are equal, round, and reactive to light. Right eye exhibits no discharge. Left eye exhibits no discharge. No scleral icterus.  Neck: Normal range of motion and phonation normal. Neck supple. No JVD present. No tracheal deviation present. No thyromegaly present.  Cardiovascular: Normal rate, regular rhythm, normal heart sounds and intact  distal pulses.  Exam reveals no gallop and no friction rub.   No murmur heard. Pulmonary/Chest: Effort normal and breath sounds normal. No accessory muscle usage. No tachypnea. No respiratory distress. She has no decreased breath sounds. She has no wheezes. She has no rhonchi. She has no rales. She exhibits no tenderness.  Abdominal: Soft. Bowel sounds are normal. She exhibits no distension and no mass. There is no tenderness. There is no rigidity, no rebound, no guarding and no CVA tenderness.  Abdomen soft, obese  Musculoskeletal: Normal range of motion. She exhibits no edema or tenderness.  Lymphadenopathy:    She has no cervical adenopathy.  Neurological: She is alert and oriented to person, place, and time. She has normal reflexes. No cranial nerve deficit. She exhibits normal muscle tone. Coordination normal.  Skin: Skin is warm. No rash noted. She is diaphoretic. No cyanosis or erythema. No pallor. Nails show no clubbing.  Psychiatric: She has a normal mood and affect. Her behavior is normal. Judgment and thought content normal.  Nursing note and vitals  reviewed.   ED Course  Procedures  DIAGNOSTIC STUDIES: Oxygen Saturation is 97% on RA, normal by my interpretation.    COORDINATION OF CARE: 6:13 PM Discussed treatment plan which includes to order diagnostic labs and chest Xray with pt. Pt acknowledges and agrees to plan.    Labs Review Labs Reviewed  BASIC METABOLIC PANEL - Abnormal; Notable for the following:    Potassium 3.4 (*)    All other components within normal limits  CBC WITH DIFFERENTIAL/PLATELET    Imaging Review Dg Chest 2 View  09/27/2014   CLINICAL DATA:  Shortness of breath, chest pain  EXAM: CHEST  2 VIEW  COMPARISON:  07/05/2008  FINDINGS: Cardiomediastinal silhouette is stable. No acute infiltrate or pleural effusion. No pulmonary edema. Mild degenerative changes lower thoracic spine. Mild upper thoracic dextroscoliosis again noted.  IMPRESSION: No active  cardiopulmonary disease.   Electronically Signed   By: Natasha Mead M.D.   On: 09/27/2014 18:52     EKG Interpretation None      MDM   Final diagnoses:  Bronchitis  URI (upper respiratory infection)    URI sx x 2 weeks with 2 days cough, SOB, fatigue, sweats W/U for CAP - CXR CBC, BMP, ambulatory oxygen sats  Chest x-ray is negative for pneumonia, labs are unremarkable, will treat as a URI with bronchitis Cough syrup and Levaquin given   I personally performed the services described in this documentation, which was scribed in my presence. The recorded information has been reviewed and is accurate.    Danelle Berry, PA-C 09/30/14 1213  Gilda Crease, MD 10/01/14 [phone]

## 2014-09-27 NOTE — Discharge Instructions (Signed)
Upper Respiratory Infection, Adult An upper respiratory infection (URI) is also known as the common cold. It is often caused by a type of germ (virus). Colds are easily spread (contagious). You can pass it to others by kissing, coughing, sneezing, or drinking out of the same glass. Usually, you get better in 1 or 2 weeks.  HOME CARE   Only take medicine as told by your doctor.  Use a warm mist humidifier or breathe in steam from a hot shower.  Drink enough water and fluids to keep your pee (urine) clear or pale yellow.  Get plenty of rest.  Return to work when your temperature is back to normal or as told by your doctor. You may use a face mask and wash your hands to stop your cold from spreading. GET HELP RIGHT AWAY IF:   After the first few days, you feel you are getting worse.  You have questions about your medicine.  You have chills, shortness of breath, or brown or red spit (mucus).  You have yellow or brown snot (nasal discharge) or pain in the face, especially when you bend forward.  You have a fever, puffy (swollen) neck, pain when you swallow, or white spots in the back of your throat.  You have a bad headache, ear pain, sinus pain, or chest pain.  You have a high-pitched whistling sound when you breathe in and out (wheezing).  You have a lasting cough or cough up blood.  You have sore muscles or a stiff neck. MAKE SURE YOU:   Understand these instructions.  Will watch your condition.  Will get help right away if you are not doing well or get worse. Document Released: 08/21/2007 Document Revised: 05/27/2011 Document Reviewed: 06/09/2013 [employer] Patient Information 2015 [employer]. This information is not intended to replace advice given to you by your health care provider. Make sure you discuss any questions you have with your health care provider.  

## 2014-09-27 NOTE — ED Notes (Signed)
While ambulating patient Mariah Weaver maintained from 98-100% throughout walk. Pulse elevated from 89 to 125 throughout walk.

## 2014-09-27 NOTE — ED Notes (Signed)
Pt states dad has pneumonia. She started with a cough last week, and over the last couple days her ribs have been increasingly more sore with the coughing. Also complaining of headache and nausea. Denies vomiting/diarrhea.

## 2015-04-11 LAB — CYTOLOGY - PAP: PAP SMEAR: NEGATIVE

## 2016-02-28 ENCOUNTER — Inpatient Hospital Stay (HOSPITAL_COMMUNITY): Payer: Self-pay

## 2016-02-28 ENCOUNTER — Encounter (HOSPITAL_COMMUNITY): Payer: Self-pay | Admitting: *Deleted

## 2016-02-28 ENCOUNTER — Inpatient Hospital Stay (HOSPITAL_COMMUNITY)
Admission: AD | Admit: 2016-02-28 | Discharge: 2016-02-28 | Disposition: A | Discharge status: Home / Self Care | Payer: Self-pay | Source: Ambulatory Visit | Attending: Obstetrics and Gynecology | Admitting: Obstetrics and Gynecology

## 2016-02-28 DIAGNOSIS — R110 Nausea: Secondary | ICD-10-CM | POA: Insufficient documentation

## 2016-02-28 DIAGNOSIS — N911 Secondary amenorrhea: Secondary | ICD-10-CM

## 2016-02-28 DIAGNOSIS — Z3202 Encounter for pregnancy test, result negative: Secondary | ICD-10-CM | POA: Insufficient documentation

## 2016-02-28 DIAGNOSIS — R102 Pelvic and perineal pain: Secondary | ICD-10-CM | POA: Insufficient documentation

## 2016-02-28 LAB — CBC WITH DIFFERENTIAL/PLATELET
BASOS ABS: 0 10*3/uL (ref 0.0–0.1)
BASOS PCT: 0 %
EOS ABS: 0.2 10*3/uL (ref 0.0–0.7)
Eosinophils Relative: 3 %
HCT: 40.2 % (ref 36.0–46.0)
Hemoglobin: 13.6 g/dL (ref 12.0–15.0)
LYMPHS ABS: 2.5 10*3/uL (ref 0.7–4.0)
Lymphocytes Relative: 36 %
MCH: 32.4 pg (ref 26.0–34.0)
MCHC: 33.8 g/dL (ref 30.0–36.0)
MCV: 95.7 fL (ref 78.0–100.0)
Monocytes Absolute: 0.4 10*3/uL (ref 0.1–1.0)
Monocytes Relative: 6 %
Neutro Abs: 3.8 10*3/uL (ref 1.7–7.7)
Neutrophils Relative %: 55 %
PLATELETS: 254 10*3/uL (ref 150–400)
RBC: 4.2 MIL/uL (ref 3.87–5.11)
RDW: 13.3 % (ref 11.5–15.5)
WBC: 6.9 10*3/uL (ref 4.0–10.5)

## 2016-02-28 LAB — URINALYSIS, ROUTINE W REFLEX MICROSCOPIC
Bilirubin Urine: NEGATIVE
GLUCOSE, UA: NEGATIVE mg/dL
Ketones, ur: NEGATIVE mg/dL
Leukocytes, UA: NEGATIVE
NITRITE: NEGATIVE
PROTEIN: NEGATIVE mg/dL
Specific Gravity, Urine: 1.028 (ref 1.005–1.030)
pH: 5 (ref 5.0–8.0)

## 2016-02-28 LAB — WET PREP, GENITAL
CLUE CELLS WET PREP: NONE SEEN
SPERM: NONE SEEN
Trich, Wet Prep: NONE SEEN
Yeast Wet Prep HPF POC: NONE SEEN

## 2016-02-28 LAB — POCT PREGNANCY, URINE: Preg Test, Ur: NEGATIVE

## 2016-02-28 LAB — HCG, SERUM, QUALITATIVE: PREG SERUM: NEGATIVE

## 2016-02-28 NOTE — MAU Note (Signed)
Pt C/O lower abd pain since October, missed a period in November.  Abdominal pain is severe @ times.  Also having nausea - no vomiting, dizziness, leg cramps, constipation @ times, diarrhea @ times.  Denies fever.

## 2016-02-28 NOTE — Discharge Instructions (Signed)
Irritable Bowel Syndrome, Adult Irritable bowel syndrome (IBS) is not one specific disease. It is a group of symptoms that affects the organs responsible for digestion (gastrointestinal or GI tract). To regulate how your GI tract works, your body sends signals back and forth between your intestines and your brain. If you have IBS, there may be a problem with these signals. As a result, your GI tract does not function normally. Your intestines may become more sensitive and overreact to certain things. This is especially true when you eat certain foods or when you are under stress. There are four types of IBS. These may be determined based on the consistency of your stool:  IBS with diarrhea.  IBS with constipation.  Mixed IBS.  Unsubtyped IBS. It is important to know which type of IBS you have. Some treatments are more likely to be helpful for certain types of IBS. What are the causes? The exact cause of IBS is not known. What increases the risk? You may have a higher risk of IBS if:  You are a woman.  You are younger than 32 years old.  You have a family history of IBS.  You have mental health problems.  You have had bacterial infection of your GI tract. What are the signs or symptoms? Symptoms of IBS vary from person to person. The main symptom is abdominal pain or discomfort. Additional symptoms usually include one or more of the following:  Diarrhea, constipation, or both.  Abdominal swelling or bloating.  Feeling full or sick after eating a small or regular-size meal.  Frequent gas.  Mucus in the stool.  A feeling of having more stool left after a bowel movement. Symptoms tend to come and go. They may be associated with stress, psychiatric conditions, or nothing at all. How is this diagnosed? There is no specific test to diagnose IBS. Your health care provider will make a diagnosis based on a physical exam, medical history, and your symptoms. You may have other tests to  rule out other conditions that may be causing your symptoms. These may include:  Blood tests.  X-rays.  CT scan.  Endoscopy and colonoscopy. This is a test in which your GI tract is viewed with a long, thin, flexible tube. How is this treated? There is no cure for IBS, but treatment can help relieve symptoms. IBS treatment often includes:  Changes to your diet, such as:  Eating more fiber.  Avoiding foods that cause symptoms.  Drinking more water.  Eating regular, medium-sized portioned meals.  Medicines. These may include:  Fiber supplements if you have constipation.  Medicine to control diarrhea (antidiarrheal medicines).  Medicine to help control muscle spasms in your GI tract (antispasmodic medicines).  Medicines to help with any mental health issues, such as antidepressants or tranquilizers.  Therapy.  Talk therapy may help with anxiety, depression, or other mental health issues that can make IBS symptoms worse.  Stress reduction.  Managing your stress can help keep symptoms under control. Follow these instructions at home:  Take medicines only as directed by your health care provider.  Eat a healthy diet.  Avoid foods and drinks with added sugar.  Include more whole grains, fruits, and vegetables gradually into your diet. This may be especially helpful if you have IBS with constipation.  Avoid any foods and drinks that make your symptoms worse. These may include dairy products and caffeinated or carbonated drinks.  Do not eat large meals.  Drink enough fluid to keep your urine  clear or pale yellow.  Exercise regularly. Ask your health care provider for recommendations of good activities for you.  Keep all follow-up visits as directed by your health care provider. This is important. Contact a health care provider if:  You have constant pain.  You have trouble or pain with swallowing.  You have worsening diarrhea. Get help right away if:  You  have severe and worsening abdominal pain.  You have diarrhea and:  You have a rash, stiff neck, or severe headache.  You are irritable, sleepy, or difficult to awaken.  You are weak, dizzy, or extremely thirsty.  You have bright red blood in your stool or you have black tarry stools.  You have unusual abdominal swelling that is painful.  You vomit continuously.  You vomit blood (hematemesis).  You have both abdominal pain and a fever. This information is not intended to replace advice given to you by your health care provider. Make sure you discuss any questions you have with your health care provider. Document Released: 03/04/2005 Document Revised: 08/04/2015 Document Reviewed: 11/19/2013 Elsevier Interactive Patient Education  2017 [employer] Inc. Secondary Amenorrhea Secondary amenorrhea is the stopping of menstrual flow for 3-6 months in a female who has previously had periods. There are many possible causes. Most of these causes are not serious. Usually, treating the underlying problem causing the loss of menses will return your periods to normal. What are the causes? Some common and uncommon causes of not menstruating include:  Malnutrition.  Low blood sugar (hypoglycemia).  Polycystic ovary disease.  Stress or fear.  Breastfeeding.  Hormone imbalance.  Ovarian failure.  Medicines.  Extreme obesity.  Cystic fibrosis.  Low body weight or drastic weight reduction from any cause.  Early menopause.  Removal of ovaries or uterus.  Contraceptives.  Illness.  Long-term (chronic) illnesses.  Cushing syndrome.  Thyroid problems.  Birth control pills, patches, or vaginal rings for birth control. What increases the risk? You may be at greater risk of secondary amenorrhea if:  You have a family history of this condition.  You have an eating disorder.  You do athletic training. How is this diagnosed? A diagnosis is made by your health care provider  taking a medical history and doing a physical exam. This will include a pelvic exam to check for problems with your reproductive organs. Pregnancy must be ruled out. Often, numerous blood tests are done to measure different hormones in the body. Urine testing may be done. Specialized exams (ultrasound, CT scan, MRI, or hysteroscopy) may have to be done as well as measuring the body mass index (BMI). How is this treated? Treatment depends on the cause of the amenorrhea. If an eating disorder is present, this can be treated with an adequate diet and therapy. Chronic illnesses may improve with treatment of the illness. Amenorrhea may be corrected with medicines, lifestyle changes, or surgery. If the amenorrhea cannot be corrected, it is sometimes possible to create a false menstruation with medicines. Follow these instructions at home:  Maintain a healthy diet.  Manage weight problems.  Exercise regularly but not excessively.  Get adequate sleep.  Manage stress.  Be aware of changes in your menstrual cycle. Keep a record of when your periods occur. Note the date your period starts, how long it lasts, and any problems. Contact a health care provider if: Your symptoms do not get better with treatment. This information is not intended to replace advice given to you by your health care provider. Make sure you  discuss any questions you have with your health care provider. Document Released: 04/15/2006 Document Revised: 08/10/2015 Document Reviewed: 08/20/2012 Elsevier Interactive Patient Education  2017 [employer]

## 2016-02-28 NOTE — MAU Note (Signed)
Pt states her husband used a stethoscope and fetal doppler and heard a heartbeat.

## 2016-02-28 NOTE — MAU Provider Note (Signed)
Chief Complaint:  Abdominal Pain   First Provider Initiated Contact with Patient 02/28/16 1850     HPI: Mariah Weaver is a 30 y.o. G1P1001 who presents to maternity admissions reporting lower abdominal pain, weight gain, vaginal discharge with odor, nausea, alternating constipation and diarrhea and thinks her husband heard a heart beat at home.  Is sure she is pregnant or "something is terribly wrong".  . She reports vaginal bleeding, vaginal itching/burning, urinary symptoms, h/a, dizziness, n/v, or fever/chills.    Patient of Dr Rana SnareLowe.  Has not called office or been to office because she can't get off work during day "so that is why I came tonight, to get answers".  Adamant she feels pregnant and something is very wrong. Tearful at times.  Had two visits in 2015 with similar complaints, insisting she was pregnant. Has had one baby in 2014   Abdominal Pain  This is a recurrent problem. The current episode started more than 1 month ago. The onset quality is gradual. The problem occurs constantly. The problem has been unchanged. The pain is located in the LLQ, RLQ and suprapubic region. The pain is severe. The quality of the pain is cramping, burning, sharp and a sensation of fullness. The abdominal pain does not radiate. Associated symptoms include constipation, diarrhea and nausea. Pertinent negatives include no anorexia, dysuria, fever, frequency, headaches, hematuria, myalgias, vomiting or weight loss (weight gain). The pain is aggravated by palpation and movement. The pain is relieved by nothing. She has tried nothing for the symptoms.   RN Note: Pt C/O lower abd pain since October, missed a period in November.  Abdominal pain is severe @ times.  Also having nausea - no vomiting, dizziness, leg cramps, constipation @ times, diarrhea @ times.  Denies fever.  Pt states her husband used a stethoscope and fetal doppler and heard a heartbeat  Past Medical History: Past Medical History:  Diagnosis  Date  . 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 Chronic back pain   . Ear malformation   . 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 Hx of varicella   . Klippel Trenaunay syndrome   . Scoliosis   . Scoliosis     Past obstetric history: OB History  Gravida Para Term Preterm AB Living  1 1 1     1   SAB TAB Ectopic Multiple Live Births          1    # Outcome Date GA Lbr Len/2nd Weight Sex Delivery Anes PTL Lv  1 Term 04/07/12 [id number] 16:05 / 04:55  M CS-LTranv EPI  LIV      Past Surgical History: Past Surgical History:  Procedure Laterality Date  . CESAREAN SECTION  04/07/2012   Procedure: CESAREAN SECTION;  Surgeon: Mitchel HonourMegan Morris, DO;  Location: [facility];  Service: Obstetrics;  Laterality: N/A;  Primary Cesarean Section Delivery Baby  Boy  @ [phone],   Apgars 9/9  . EAR CANALOPLASTY    . LEG SURGERY    . MANDIBLE RECONSTRUCTION    . TONSILLECTOMY      Family History: Family History  Problem Relation Age of Onset  . Adopted: Yes  . Heart disease Mother   . Heart disease Father     Social History: Social History  Substance Use Topics  . Smoking status: Former Games developermoker  . Smokeless tobacco: Never Used  . Alcohol use No    Allergies:  Allergies  Allergen Reactions  . Sulfamethoxazole Hives and Itching    Meds:  Prescriptions Prior to Admission  Medication Sig Dispense Refill Last Dose  . amoxicillin-clavulanate (AUGMENTIN) 875-125 MG per tablet Take 1 tablet by mouth 2 (two) times daily. (Patient not taking: Reported on 09/27/2014) 14 tablet 0 Not Taking at Unknown time  . benzonatate (TESSALON) 200 MG capsule Take 1 capsule (200 mg total) by mouth every 8 (eight) hours as needed for cough. (Patient not taking: Reported on 07/19/2014) 20 capsule 0 Not Taking at Unknown time  . dextromethorphan 15 MG/5ML syrup Take 10 mLs (30 mg total) by mouth 4 (four) times daily as needed for cough. 120 mL 0   . ibuprofen (ADVIL,MOTRIN) 600 MG tablet Take 1 tablet (600 mg  total) by mouth every 6 (six) hours. (Patient not taking: Reported on 07/19/2014) 30 tablet 1 Not Taking at Unknown time  . levofloxacin (LEVAQUIN) 750 MG tablet Take 1 tablet (750 mg total) by mouth daily. 7 tablet 0   . oxyCODONE-acetaminophen (PERCOCET/ROXICET) 5-325 MG per tablet Take 1-2 tablets by mouth every 4 (four) hours as needed (moderate - severe pain). (Patient not taking: Reported on 07/19/2014) 30 tablet 0 Not Taking at Unknown time  . Phenyleph-CPM-DM-APAP (TYLENOL COLD MULTI-SYMPTOM PO) Take 2 tablets by mouth every 8 (eight) hours as needed (cold symptoms).   09/26/2014 at Unknown time    I have reviewed patient's Past Medical Hx, Surgical Hx, Family Hx, Social Hx, medications and allergies.  ROS:  Review of Systems  Constitutional: Negative for fever and weight loss (weight gain).  Gastrointestinal: Positive for abdominal pain, constipation, diarrhea and nausea. Negative for anorexia and vomiting.  Genitourinary: Negative for dysuria, frequency and hematuria.  Musculoskeletal: Negative for myalgias.  Neurological: Negative for headaches.   Other systems negative     Physical Exam  Patient Vitals for the past 24 hrs:  BP Temp Temp src Pulse Resp Height Weight  02/28/16 1809 129/82 97.4 F (36.3 C) Oral 70 18 5' 4.5" (1.638 m) 258 lb (117 kg)   Constitutional: Well-developed, well-nourished female in no acute distress.  Cardiovascular: normal rate and rhythm, no ectopy audible, S1 & S2 heard, no murmur Respiratory: normal effort, no distress. Lungs CTAB with no wheezes or crackles GI: Abd soft, mildly to moderately tender in lower abdomen, non-focal.  Nondistended.  No rebound, No guarding.  Bowel Sounds audible  MS: Extremities nontender, no edema, normal ROM Neurologic: Alert and oriented x 4.   Grossly nonfocal. GU: Neg CVAT. Skin:  Warm and Dry Psych:  Affect appropriate.  PELVIC EXAM: Cervix pink, visually closed, without lesion, scant white thin discharge, some  odor, vaginal walls and external genitalia normal Bimanual exam: Cervix firm, anterior, neg CMT, uterus nontender, nonenlarged, adnexa without tenderness, enlargement, or mass    Labs: Results for orders placed or performed during the hospital encounter of 02/28/16 (from the past 72 hour(s))  Urinalysis, Routine w reflex microscopic     Status: Abnormal   Collection Time: 02/28/16  6:18 PM  Result Value Ref Range   Color, Urine YELLOW YELLOW   APPearance CLEAR CLEAR   Specific Gravity, Urine 1.028 1.005 - 1.030   pH 5.0 5.0 - 8.0   Glucose, UA NEGATIVE NEGATIVE mg/dL   Hgb urine dipstick MODERATE (A) NEGATIVE   Bilirubin Urine NEGATIVE NEGATIVE   Ketones, ur NEGATIVE NEGATIVE mg/dL   Protein, ur NEGATIVE NEGATIVE mg/dL   Nitrite NEGATIVE NEGATIVE   Leukocytes, UA NEGATIVE NEGATIVE  RBC / HPF 6-30 0 - 5 RBC/hpf   WBC, UA 0-5 0 - 5 WBC/hpf   Bacteria, UA RARE (A) NONE SEEN   Squamous Epithelial / LPF 0-5 (A) NONE SEEN   Mucous PRESENT    Ca Oxalate Crys, UA PRESENT   Pregnancy, urine POC     Status: None   Collection Time: 02/28/16  6:25 PM  Result Value Ref Range   Preg Test, Ur NEGATIVE NEGATIVE    Comment:        THE SENSITIVITY OF THIS METHODOLOGY IS >24 mIU/mL   Wet prep, genital     Status: Abnormal   Collection Time: 02/28/16  6:53 PM  Result Value Ref Range   Yeast Wet Prep HPF POC NONE SEEN NONE SEEN   Trich, Wet Prep NONE SEEN NONE SEEN   Clue Cells Wet Prep HPF POC NONE SEEN NONE SEEN   WBC, Wet Prep HPF POC MODERATE (A) NONE SEEN    Comment: MANY BACTERIA SEEN   Sperm NONE SEEN   CBC with Differential/Platelet     Status: None   Collection Time: 02/28/16  7:08 PM  Result Value Ref Range   WBC 6.9 4.0 - 10.5 K/uL   RBC 4.20 3.87 - 5.11 MIL/uL   Hemoglobin 13.6 12.0 - 15.0 g/dL   HCT 16.1 09.6 - 04.5 %   MCV 95.7 78.0 - 100.0 fL   MCH 32.4 26.0 - 34.0 pg   MCHC 33.8 30.0 - 36.0 g/dL   RDW 40.9 81.1 - 91.4 %   Platelets 254 150 - 400 K/uL   Neutrophils  Relative % 55 %   Neutro Abs 3.8 1.7 - 7.7 K/uL   Lymphocytes Relative 36 %   Lymphs Abs 2.5 0.7 - 4.0 K/uL   Monocytes Relative 6 %   Monocytes Absolute 0.4 0.1 - 1.0 K/uL   Eosinophils Relative 3 %   Eosinophils Absolute 0.2 0.0 - 0.7 K/uL   Basophils Relative 0 %   Basophils Absolute 0.0 0.0 - 0.1 K/uL  hCG, serum, qualitative     Status: None   Collection Time: 02/28/16  7:08 PM  Result Value Ref Range   Preg, Serum NEGATIVE NEGATIVE    Comment:        THE SENSITIVITY OF THIS METHODOLOGY IS >10 mIU/mL.     Imaging:  No results found.  MAU Course/MDM: I have ordered labs as follows: See above.  Pregnancy test is negative. Patient insists she is pregnant.  Adamant. Blood qualitative test done and is negative.   Imaging ordered: Pelvic US Results reviewed. [place] is totally negative.  Patient seems satisfied. I urged her to followup with Dr Rana Snare.  She states she will try in January.   Consult Dr Renaldo Fiddler.  She recommends CBC now to rule out infection then have her do a Provera challenge to stimulate a cycle.  Then followup in office for [place] and exam.  Patient refuses Provera challenge for now  Treatments in MAU included See above.  I did discuss that I have a strong suspicion that she has mixed Irritable Bowel Syndrome, based on her symptoms and presentation. Urged her to consult with GI for evaluation.  Pt stable at time of discharge.  Assessment: Pelvic Pain Secondary Amenorrhea No significant pathology found in tonight's exam/eval  Plan: Discharge home Recommend See family doctor and get referral to GI for evaluation of probable Mixed IBS Recommend Probiotics and fiber.  Encouraged to return here or to other Urgent Care/ED  if she develops worsening of symptoms, increase in pain, fever, or other concerning symptoms.   Wynelle Bourgeois CNM, MSN Certified Nurse-Midwife 02/28/2016 7:03 PM

## 2017-10-25 ENCOUNTER — Emergency Department (HOSPITAL_COMMUNITY)
Admission: EM | Admit: 2017-10-25 | Discharge: 2017-10-25 | Disposition: A | Discharge status: Home / Self Care | Payer: Self-pay | Attending: Emergency Medicine | Admitting: Emergency Medicine

## 2017-10-25 ENCOUNTER — Other Ambulatory Visit: Payer: Self-pay

## 2017-10-25 ENCOUNTER — Encounter (HOSPITAL_COMMUNITY): Payer: Self-pay

## 2017-10-25 DIAGNOSIS — K429 Umbilical hernia without obstruction or gangrene: Secondary | ICD-10-CM | POA: Insufficient documentation

## 2017-10-25 DIAGNOSIS — Z79899 Other long term (current) drug therapy: Secondary | ICD-10-CM | POA: Insufficient documentation

## 2017-10-25 LAB — URINALYSIS, ROUTINE W REFLEX MICROSCOPIC
BILIRUBIN URINE: NEGATIVE
GLUCOSE, UA: NEGATIVE mg/dL
KETONES UR: NEGATIVE mg/dL
NITRITE: NEGATIVE
PH: 6 (ref 5.0–8.0)
PROTEIN: NEGATIVE mg/dL
Specific Gravity, Urine: 1.02 (ref 1.005–1.030)

## 2017-10-25 NOTE — ED Provider Notes (Signed)
[facility]-EMERGENCY DEPT Provider Note   CSN: [id number] Arrival date & time: 10/25/17  1404     History   Chief Complaint Chief Complaint  Patient presents with  . Abdominal Pain    HPI Mariah Weaver is a 32 y.o. female.  32 year old female presents with a 5-year history of umbilical hernia that has been worse since April of this year.  States that she has intermittent pain but has not had any emesis.  Pain characterized as dull and does not radiate to her back.  No change to her bowel function.  States that she avoids heavy lifting at this time.  No urinary symptoms.  Called urgent care and could come in for further management.  Has not followed up with the surgeon.     Past Medical History:  Diagnosis Date  . 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 Chronic back pain   . Ear malformation   . 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 Hx of varicella   . Klippel Trenaunay syndrome   . Scoliosis   . Scoliosis     Patient Active Problem List   Diagnosis Date Noted  . URI 01/26/2007  . Abdominal pain, unspecified site 01/26/2007  . SORE THROAT 11/18/2006  . BACKACHE NOS 11/18/2006  . CHEST PAIN 11/18/2006    Past Surgical History:  Procedure Laterality Date  . CESAREAN SECTION  04/07/2012   Procedure: CESAREAN SECTION;  Surgeon: Mitchel HonourMegan Morris, DO;  Location: [facility];  Service: Obstetrics;  Laterality: N/A;  Primary Cesarean Section Delivery Baby  Boy  @ [phone],   Apgars 9/9  . EAR CANALOPLASTY    . LEG SURGERY    . MANDIBLE RECONSTRUCTION    . TONSILLECTOMY       OB History    Gravida  1   Para  1   Term  1   Preterm      AB      Living  1     SAB      TAB      Ectopic      Multiple      Live Births  1            Home Medications    Prior to Admission medications   Medication Sig Start Date End Date Taking? Authorizing Provider  acetaminophen (TYLENOL) 500 MG tablet Take 1,000 mg by mouth every 6  (six) hours as needed for mild pain, moderate pain or headache.    [provider]    Family History Family History  Adopted: Yes  Problem Relation Age of Onset  . Heart disease Mother   . Heart disease Father     Social History Social History   Tobacco Use  . Smoking status: Never Smoker  . Smokeless tobacco: Never Used  Substance Use Topics  . Alcohol use: No  . Drug use: No     Allergies   Sulfa antibiotics   Review of Systems Review of Systems  All other systems reviewed and are negative.    Physical Exam Updated Vital Signs BP (!) 142/91 (BP Location: Left Arm)   Pulse 75   Temp 97.8 F (36.6 C) (Oral)   Resp 16   Ht 1.638 m (5' 4.5")   Wt 106.6 kg   LMP 10/11/2017 (Approximate)   SpO2 100%   BMI 39.71 kg/m   Physical Exam  Constitutional:  She is oriented to person, place, and time. She appears well-developed and well-nourished.  Non-toxic appearance. No distress.  HENT:  Head: Normocephalic and atraumatic.  Eyes: Pupils are equal, round, and reactive to light. Conjunctivae, EOM and lids are normal.  Neck: Normal range of motion. Neck supple. No tracheal deviation present. No thyroid mass present.  Cardiovascular: Normal rate, regular rhythm and normal heart sounds. Exam reveals no gallop.  No murmur heard. Pulmonary/Chest: Effort normal and breath sounds normal. No stridor. No respiratory distress. She has no decreased breath sounds. She has no wheezes. She has no rhonchi. She has no rales.  Abdominal: Soft. Normal appearance and bowel sounds are normal. She exhibits no distension. There is no tenderness. There is no rebound and no CVA tenderness.    Musculoskeletal: Normal range of motion. She exhibits no edema or tenderness.  Neurological: She is alert and oriented to person, place, and time. She has normal strength. No cranial nerve deficit or sensory deficit. GCS eye subscore is 4. GCS verbal subscore is 5. GCS motor subscore is 6.    Skin: Skin is warm and dry. No abrasion and no rash noted.  Psychiatric: She has a normal mood and affect. Her speech is normal and behavior is normal.  Nursing note and vitals reviewed.    ED Treatments / Results  Labs (all labs ordered are listed, but only abnormal results are displayed) Labs Reviewed  URINALYSIS, ROUTINE W REFLEX MICROSCOPIC    EKG None  Radiology No results found.  Procedures Procedures (including critical care time)  Medications Ordered in ED Medications - No data to display   Initial Impression / Assessment and Plan / ED Course  I have reviewed the triage vital signs and the nursing notes.  Pertinent labs & imaging results that were available during my care of the patient were reviewed by me and considered in my medical decision making (see chart for details).     Patient has no signs of an incarcerated hernia.  Will give general surgery referral  Final Clinical Impressions(s) / ED Diagnoses   Final diagnoses:  None    ED Discharge Orders    None       Lorre Nick, MD 10/25/17 1434

## 2017-10-25 NOTE — ED Notes (Signed)
ED Provider at bedside. 

## 2017-10-25 NOTE — ED Triage Notes (Signed)
Patient reports that she has had umbilical hernia pain x 4 days. patient states the pain increased x 3 days ago. Patient also c/o nausea, especially after eating.

## 2017-11-20 ENCOUNTER — Other Ambulatory Visit: Payer: Self-pay

## 2017-11-20 ENCOUNTER — Inpatient Hospital Stay (HOSPITAL_COMMUNITY)
Admission: AD | Admit: 2017-11-20 | Discharge: 2017-11-20 | Disposition: A | Discharge status: Home / Self Care | Payer: Medicaid Other | Source: Ambulatory Visit | Attending: Obstetrics and Gynecology | Admitting: Obstetrics and Gynecology

## 2017-11-20 DIAGNOSIS — Z1389 Encounter for screening for other disorder: Secondary | ICD-10-CM | POA: Diagnosis not present

## 2017-11-20 NOTE — MAU Note (Signed)
Pt presents for confirmation of pregnancy. Denies any pain or VB

## 2017-11-20 NOTE — MAU Provider Note (Signed)
Mariah Weaver 32 y.o.  Presents to MAU tonight with no symptoms but has a positive home pregnancy test and needs a confirmation to be able to apply for Medicaid.  Discussed with client that this is an ER and currently confirmation of pregnancy is being done in the walk in clinic downstairs.  There are after hours walk ins until 7 pm on Mon, Tues and Wed evenings.  Client will go on Monday.  [name], RN, MSN, NP-BC Nurse Practitioner, [facility] for [employer], [facility] 11/20/2017 6:35 PM

## 2017-11-24 ENCOUNTER — Ambulatory Visit (INDEPENDENT_AMBULATORY_CARE_PROVIDER_SITE_OTHER): Payer: Self-pay | Admitting: Family Medicine

## 2017-11-24 ENCOUNTER — Encounter: Payer: Self-pay | Admitting: Family Medicine

## 2017-11-24 DIAGNOSIS — Z3201 Encounter for pregnancy test, result positive: Secondary | ICD-10-CM

## 2017-11-24 LAB — POCT PREGNANCY, URINE: Preg Test, Ur: POSITIVE — AB

## 2017-11-24 NOTE — Progress Notes (Signed)
Pt here for pregnancy confirmation and verification letter to apply for medicaid. UPT positive today. THinks LMP 10/09/17. Had one episode last wk of pink d/c and nothing since. No pain and no vag d/c. Encouraged to take OTC PNV and make appt to begin [facility] with provider of choice. Agrees with plan. Understands if has vag bleeding and/or severe abd pain to go to MAU

## 2018-01-07 ENCOUNTER — Encounter: Payer: Self-pay | Admitting: Advanced Practice Midwife

## 2018-01-07 ENCOUNTER — Ambulatory Visit (INDEPENDENT_AMBULATORY_CARE_PROVIDER_SITE_OTHER): Payer: Medicaid Other | Admitting: Advanced Practice Midwife

## 2018-01-07 ENCOUNTER — Ambulatory Visit: Payer: Self-pay | Admitting: Clinical

## 2018-01-07 VITALS — BP 114/69 | HR 74 | Wt 251.2 lb

## 2018-01-07 DIAGNOSIS — Z3490 Encounter for supervision of normal pregnancy, unspecified, unspecified trimester: Secondary | ICD-10-CM

## 2018-01-07 DIAGNOSIS — O34219 Maternal care for unspecified type scar from previous cesarean delivery: Secondary | ICD-10-CM

## 2018-01-07 DIAGNOSIS — K439 Ventral hernia without obstruction or gangrene: Secondary | ICD-10-CM | POA: Diagnosis not present

## 2018-01-07 DIAGNOSIS — Q872 Congenital malformation syndromes predominantly involving limbs: Secondary | ICD-10-CM | POA: Insufficient documentation

## 2018-01-07 DIAGNOSIS — O0990 Supervision of high risk pregnancy, unspecified, unspecified trimester: Secondary | ICD-10-CM | POA: Insufficient documentation

## 2018-01-07 DIAGNOSIS — Z23 Encounter for immunization: Secondary | ICD-10-CM

## 2018-01-07 DIAGNOSIS — O26899 Other specified pregnancy related conditions, unspecified trimester: Secondary | ICD-10-CM | POA: Diagnosis not present

## 2018-01-07 DIAGNOSIS — Z3480 Encounter for supervision of other normal pregnancy, unspecified trimester: Secondary | ICD-10-CM

## 2018-01-07 DIAGNOSIS — N898 Other specified noninflammatory disorders of vagina: Secondary | ICD-10-CM

## 2018-01-07 DIAGNOSIS — Z113 Encounter for screening for infections with a predominantly sexual mode of transmission: Secondary | ICD-10-CM

## 2018-01-07 LAB — POCT URINALYSIS DIP (DEVICE)
BILIRUBIN URINE: NEGATIVE
GLUCOSE, UA: NEGATIVE mg/dL
Ketones, ur: NEGATIVE mg/dL
NITRITE: NEGATIVE
PH: 7 (ref 5.0–8.0)
Protein, ur: NEGATIVE mg/dL
Specific Gravity, Urine: 1.02 (ref 1.005–1.030)
Urobilinogen, UA: 0.2 mg/dL (ref 0.0–1.0)

## 2018-01-07 NOTE — Progress Notes (Signed)
Subjective:    Mariah Weaver is being seen today for her first obstetrical visit.  This is not a planned pregnancy. She is at 12.[redacted] weeks gestation by LMP. Her obstetrical history is significant for C/S for arrest of descent and fever. fetal wt 7-2.Marland Kitchen Relationship with FOB: spouse, living together. Patient does intend to breast feed, but was not able to do so for very long w/ last baby. Pregnancy history fully reviewed.  Has Klippel Trenaunay syndrome which causes scoliosis, skeletal abnormalities which may have contributed to her needing C/S (described a "bone getting in the way" of vaginal delivery.) MRI reviewed. Unsure what she is referring to. Desires repeat C/S. Has hearing loss and numerous surgeries R/T ear, jaw and leg abnormalities.   Also has large ventral hernia. Saw General Surgeon 11/2017 who she states recommended hernia repair ~3 weeks after delivery. Pt desires 1 more baby after this.   Patient reports vaginal irritation.  Past Medical History:  Diagnosis Date  . 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 Chronic back pain   . Ear malformation   . 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 Hx of varicella   . Klippel Trenaunay syndrome   . Scoliosis   . Scoliosis   . Umbilical hernia    Past Surgical History:  Procedure Laterality Date  . CESAREAN SECTION  04/07/2012   Procedure: CESAREAN SECTION;  Surgeon: Linda Hedges, DO;  Location: [facility];  Service: Obstetrics;  Laterality: N/A;  Primary Cesarean Section Delivery Baby  Boy  @ [phone],   Apgars 9/9  . EAR CANALOPLASTY    . LEG SURGERY    . MANDIBLE RECONSTRUCTION    . TONSILLECTOMY       Review of Systems:   Review of Systems  Constitutional: Negative for chills and fever.  Gastrointestinal: Negative for abdominal pain, nausea and vomiting.  Genitourinary: Negative for vaginal bleeding and vaginal discharge.       Positive for vaginal irritation.     Objective:     BP 114/69   Pulse 74   Wt  251 lb 3.2 oz (113.9 kg)   LMP 10/09/2017 (Approximate)   BMI 42.45 kg/m  Physical Exam  Constitutional: She appears well-developed and well-nourished. No distress.  Eyes: No scleral icterus.  Cardiovascular: Normal rate and regular rhythm.  Respiratory: Effort normal. No respiratory distress.  GI: Soft. She exhibits mass. There is no tenderness. There is no rebound.    Exam    Assessment:    Pregnancy: G2P1001 Patient Active Problem List   Diagnosis Date Noted  . 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 History of cesarean delivery, currently pregnant 01/07/2018  . Supervision of other normal pregnancy, antepartum 01/07/2018  . Abdominal pain, unspecified site 01/26/2007  . CHEST PAIN 11/18/2006       Plan:  1. Klippel Trenaunay syndrome - Not inherited disorder. Mutation of PIK3CA gene. Genetic counseling offered, declined  2. History of cesarean delivery, currently pregnant - Plans repeat  3. Supervision of other normal pregnancy, antepartum  - Obstetric Panel, Including HIV - Culture, OB Urine - [place] MFM OB DETAIL +14 WK; Future - Flu Vaccine QUAD 36+ mos IM - CHL AMB BABYSCRIPTS OPT IN - Cervicovaginal ancillary only  4. Pregnancy with uncertain dates, antepartum - OB <14  5. Vaginal discharge during pregnancy, antepartum - Cervicovaginal ancillary only  6. Abdominal Hernia - To ED for emergencies. - Recommend Consulting  General Surgeon regarding timing of surgery if she plans to have other children.    Initial labs drawn. Prenatal vitamins. Problem list reviewed and updated. AFP3 discussed: requested. Role of ultrasound in pregnancy discussed; fetal survey: requested. Amniocentesis discussed: not indicated. Follow up in 4 weeks.    Manya Silvas 01/07/2018

## 2018-01-07 NOTE — BH Specialist Note (Signed)
Integrated Behavioral Health Initial Visit  MRN: [id number] Name: Tanesia Butner Caldron  Number of Integrated Behavioral Health Clinician visits:: 1/6 Session Start time: 10:15  Session End time: 10:26 Total time: 15 minutes  Type of Service: Integrated Behavioral Health- Individual/Family Interpretor:No. Interpretor Name and Language: n/a   Warm Hand Off Completed.       SUBJECTIVE: Makiyla Linch Chock is a 32 y.o. female accompanied by Mother Patient was referred by Dorathy Kinsman, CNM for Initial OB introduction to integrated behavioral health services . Patient reports the following symptoms/concerns: Pt states her primary concern today is "morning sickness", and attributes mild anxiety symptoms to early pregnancy.  Duration of problem: Current pregnancy; Severity of problem: mild  OBJECTIVE: Mood: Normal and Affect: Appropriate Risk of harm to self or others: No plan to harm self or others  LIFE CONTEXT: Family and Social: Pt lives with her husband and 5yo son(who experiences autism) School/Work: - Self-Care: - Life Changes: Current pregnancy   GOALS ADDRESSED: Patient will: 1. Reduce symptoms of: anxiety  2. Demonstrate ability to: Increase healthy adjustment to current life circumstances  INTERVENTIONS: Interventions utilized: Supportive Counseling and Psychoeducation and/or Health Education  Standardized Assessments completed: GAD-7 and PHQ 9  ASSESSMENT: Patient currently experiencing Supervision of normal pregnancy, antepartum.   Patient may benefit from Initial OB introduction to integrated behavioral health services, and psychoeducation regarding preventing escalation of anxiety symptoms.  PLAN: 1. Follow up with behavioral health clinician on : As needed 2. Behavioral recommendations:  -Continue taking prenatal vitamin, as recommended by medical provider -Read educational materials regarding coping with symptoms of anxiety  3. Referral(s): Integrated [employer] (In Clinic) 4. "From scale of 1-10, how likely are you to follow plan?": 10  Rae Lips, LCSW  Depression screen [facility] 2/9 01/07/2018  Decreased Interest 1  Down, Depressed, Hopeless 0  PHQ - 2 Score 1  Altered sleeping 0  Tired, decreased energy 2  Change in appetite 3  Feeling bad or failure about yourself  0  Trouble concentrating 0  Moving slowly or fidgety/restless 0  Suicidal thoughts 0  PHQ-9 Score 6   GAD 7 : Generalized Anxiety Score 01/07/2018  Nervous, Anxious, on Edge 1  Control/stop worrying 1  Worry too much - different things 1  Trouble relaxing 2

## 2018-01-07 NOTE — Progress Notes (Signed)
Here for new ob. Had bleeding around implantation, none since then. New ob packet given. Had pap in 2 years- will sign release. Signed up for babyscripts app only.

## 2018-01-07 NOTE — Patient Instructions (Signed)

## 2018-01-08 LAB — OBSTETRIC PANEL, INCLUDING HIV
ANTIBODY SCREEN: NEGATIVE
BASOS: 0 %
Basophils Absolute: 0 10*3/uL (ref 0.0–0.2)
EOS (ABSOLUTE): 0.1 10*3/uL (ref 0.0–0.4)
Eos: 1 %
HEMATOCRIT: 37.2 % (ref 34.0–46.6)
HEMOGLOBIN: 12.4 g/dL (ref 11.1–15.9)
HEP B S AG: NEGATIVE
HIV Screen 4th Generation wRfx: NONREACTIVE
IMMATURE GRANS (ABS): 0 10*3/uL (ref 0.0–0.1)
Immature Granulocytes: 0 %
LYMPHS: 19 %
Lymphocytes Absolute: 1.3 10*3/uL (ref 0.7–3.1)
MCH: 33.2 pg — ABNORMAL HIGH (ref 26.6–33.0)
MCHC: 33.3 g/dL (ref 31.5–35.7)
MCV: 100 fL — ABNORMAL HIGH (ref 79–97)
MONOCYTES: 7 %
MONOS ABS: 0.5 10*3/uL (ref 0.1–0.9)
NEUTROS ABS: 5 10*3/uL (ref 1.4–7.0)
Neutrophils: 73 %
Platelets: 219 10*3/uL (ref 150–450)
RBC: 3.73 x10E6/uL — ABNORMAL LOW (ref 3.77–5.28)
RDW: 12 % — AB (ref 12.3–15.4)
RPR Ser Ql: NONREACTIVE
RUBELLA: 5.74 {index} (ref >0.99)
Rh Factor: POSITIVE
WBC: 7 10*3/uL (ref 3.4–10.8)

## 2018-01-09 ENCOUNTER — Other Ambulatory Visit: Payer: Self-pay | Admitting: Advanced Practice Midwife

## 2018-01-09 ENCOUNTER — Ambulatory Visit (HOSPITAL_COMMUNITY)
Admission: RE | Admit: 2018-01-09 | Discharge: 2018-01-09 | Disposition: A | Discharge status: Home / Self Care | Payer: Medicaid Other | Source: Ambulatory Visit | Attending: Advanced Practice Midwife | Admitting: Advanced Practice Midwife

## 2018-01-09 DIAGNOSIS — Z3A12 12 weeks gestation of pregnancy: Secondary | ICD-10-CM | POA: Insufficient documentation

## 2018-01-09 DIAGNOSIS — Z3490 Encounter for supervision of normal pregnancy, unspecified, unspecified trimester: Secondary | ICD-10-CM

## 2018-01-09 DIAGNOSIS — O34219 Maternal care for unspecified type scar from previous cesarean delivery: Secondary | ICD-10-CM

## 2018-01-09 DIAGNOSIS — Z3480 Encounter for supervision of other normal pregnancy, unspecified trimester: Secondary | ICD-10-CM

## 2018-01-09 DIAGNOSIS — O26891 Other specified pregnancy related conditions, first trimester: Secondary | ICD-10-CM | POA: Insufficient documentation

## 2018-01-09 DIAGNOSIS — Q872 Congenital malformation syndromes predominantly involving limbs: Secondary | ICD-10-CM | POA: Insufficient documentation

## 2018-01-09 DIAGNOSIS — Z3687 Encounter for antenatal screening for uncertain dates: Secondary | ICD-10-CM | POA: Insufficient documentation

## 2018-01-09 LAB — CERVICOVAGINAL ANCILLARY ONLY
Bacterial vaginitis: NEGATIVE
CHLAMYDIA, DNA PROBE: NEGATIVE
Candida vaginitis: NEGATIVE
Neisseria Gonorrhea: NEGATIVE
Trichomonas: NEGATIVE

## 2018-01-09 LAB — CULTURE, OB URINE

## 2018-01-09 LAB — URINE CULTURE, OB REFLEX

## 2018-01-10 DIAGNOSIS — K439 Ventral hernia without obstruction or gangrene: Secondary | ICD-10-CM | POA: Insufficient documentation

## 2018-01-21 ENCOUNTER — Encounter: Payer: Self-pay | Admitting: *Deleted

## 2018-02-04 ENCOUNTER — Ambulatory Visit (INDEPENDENT_AMBULATORY_CARE_PROVIDER_SITE_OTHER): Payer: Self-pay | Admitting: Obstetrics and Gynecology

## 2018-02-04 VITALS — BP 126/64 | HR 73 | Wt 249.0 lb

## 2018-02-04 DIAGNOSIS — Z3A16 16 weeks gestation of pregnancy: Secondary | ICD-10-CM

## 2018-02-04 DIAGNOSIS — K439 Ventral hernia without obstruction or gangrene: Secondary | ICD-10-CM

## 2018-02-04 DIAGNOSIS — Z3480 Encounter for supervision of other normal pregnancy, unspecified trimester: Secondary | ICD-10-CM

## 2018-02-04 DIAGNOSIS — O34219 Maternal care for unspecified type scar from previous cesarean delivery: Secondary | ICD-10-CM

## 2018-02-04 DIAGNOSIS — Z3482 Encounter for supervision of other normal pregnancy, second trimester: Secondary | ICD-10-CM

## 2018-02-04 NOTE — Progress Notes (Signed)
   PRENATAL VISIT NOTE  Subjective:  Mariah Weaver is a 32 y.o. G2P1001 at 55w2dbeing seen today for ongoing prenatal care.  She is currently monitored for the following issues for this low-risk pregnancy and has CHEST PAIN; Klippel Trenaunay syndrome; History of cesarean delivery, currently pregnant; Supervision of other normal pregnancy, antepartum; and Abdominal wall hernia on their problem list.  Patient reports no complaints.  Contractions: Not present. Vag. Bleeding: None.  Movement: Present. Denies leaking of fluid.   The following portions of the patient's history were reviewed and updated as appropriate: allergies, current medications, past family history, past medical history, past social history, past surgical history and problem list. Problem list updated.  Objective:   Vitals:   02/04/18 1549  BP: 126/64  Pulse: 73  Weight: 249 lb (112.9 kg)    Fetal Status: Fetal Heart Rate (bpm): 153   Movement: Present     General:  Alert, oriented and cooperative. Patient is in no acute distress.  Skin: Skin is warm and dry. No rash noted.   Cardiovascular: Normal heart rate noted  Respiratory: Normal respiratory effort, no problems with respiration noted  Abdomen: Soft, gravid, appropriate for gestational age. Baseball size hernia, easily reducible, minimal pain  Pain/Pressure: Absent     Pelvic: Cervical exam deferred        Extremities: Normal range of motion.  Edema: Trace  Mental Status: Normal mood and affect. Normal behavior. Normal judgment and thought content.   Assessment and Plan:  Pregnancy: G2P1001 at 125w2d1. Supervision of other normal pregnancy, antepartum  - Doing well.  - Medicaid pending. Discussed labs that we can do once insurance is active. Patient agreeable. Still desires genetic testing.    2. History of cesarean delivery, currently pregnant  - Desires repeat due to klippel Trenaunay syndrome  3. Abdominal wall hernia  - Discussed reasons to go  to ED, patient met with general surgery who recommends surgery 6 weeks after cesarean section. Patient has concerns about this because she is considering another pregnancy after this. Will schedule with OB surgeon in the third trimester to discuss hernia repair after Cesarean section.   There are no diagnoses linked to this encounter. Preterm labor symptoms and general obstetric precautions including but not limited to vaginal bleeding, contractions, leaking of fluid and fetal movement were reviewed in detail with the patient. Please refer to After Visit Summary for other counseling recommendations.  Return in about 4 weeks (around 03/04/2018).  Future Appointments  Date Time Provider DeNenahnezad12/04/2017  2:15 PM [facility] [facility]    JeNoni SaupeNP

## 2018-02-09 ENCOUNTER — Encounter (HOSPITAL_COMMUNITY): Payer: Self-pay

## 2018-02-16 ENCOUNTER — Ambulatory Visit (HOSPITAL_COMMUNITY)
Admission: RE | Admit: 2018-02-16 | Discharge: 2018-02-16 | Disposition: A | Discharge status: Home / Self Care | Payer: Medicaid Other | Source: Ambulatory Visit | Attending: Advanced Practice Midwife | Admitting: Advanced Practice Midwife

## 2018-02-16 DIAGNOSIS — Q872 Congenital malformation syndromes predominantly involving limbs: Secondary | ICD-10-CM

## 2018-02-16 DIAGNOSIS — Z3A18 18 weeks gestation of pregnancy: Secondary | ICD-10-CM | POA: Insufficient documentation

## 2018-02-16 DIAGNOSIS — O99212 Obesity complicating pregnancy, second trimester: Secondary | ICD-10-CM | POA: Diagnosis not present

## 2018-02-16 DIAGNOSIS — Z363 Encounter for antenatal screening for malformations: Secondary | ICD-10-CM | POA: Diagnosis not present

## 2018-02-16 DIAGNOSIS — O26899 Other specified pregnancy related conditions, unspecified trimester: Secondary | ICD-10-CM

## 2018-02-16 DIAGNOSIS — K439 Ventral hernia without obstruction or gangrene: Secondary | ICD-10-CM

## 2018-02-16 DIAGNOSIS — O34219 Maternal care for unspecified type scar from previous cesarean delivery: Secondary | ICD-10-CM | POA: Diagnosis not present

## 2018-02-16 DIAGNOSIS — Z3480 Encounter for supervision of other normal pregnancy, unspecified trimester: Secondary | ICD-10-CM

## 2018-02-16 DIAGNOSIS — Z3490 Encounter for supervision of normal pregnancy, unspecified, unspecified trimester: Secondary | ICD-10-CM

## 2018-02-16 DIAGNOSIS — N898 Other specified noninflammatory disorders of vagina: Secondary | ICD-10-CM

## 2018-02-16 DIAGNOSIS — O2692 Pregnancy related conditions, unspecified, second trimester: Secondary | ICD-10-CM | POA: Diagnosis not present

## 2018-03-04 ENCOUNTER — Ambulatory Visit (INDEPENDENT_AMBULATORY_CARE_PROVIDER_SITE_OTHER): Payer: Self-pay | Admitting: Nurse Practitioner

## 2018-03-04 VITALS — BP 121/75 | HR 69 | Wt 250.4 lb

## 2018-03-04 DIAGNOSIS — Z3482 Encounter for supervision of other normal pregnancy, second trimester: Secondary | ICD-10-CM

## 2018-03-04 DIAGNOSIS — O34219 Maternal care for unspecified type scar from previous cesarean delivery: Secondary | ICD-10-CM

## 2018-03-04 DIAGNOSIS — K439 Ventral hernia without obstruction or gangrene: Secondary | ICD-10-CM

## 2018-03-04 DIAGNOSIS — Z3480 Encounter for supervision of other normal pregnancy, unspecified trimester: Secondary | ICD-10-CM

## 2018-03-04 DIAGNOSIS — Q872 Congenital malformation syndromes predominantly involving limbs: Secondary | ICD-10-CM

## 2018-03-04 MED ORDER — COMFORT FIT MATERNITY SUPP LG MISC: 0 refills | Status: DC

## 2018-03-04 NOTE — Patient Instructions (Signed)

## 2018-03-04 NOTE — Progress Notes (Signed)
    Subjective:  Mariah Weaver is a 32 y.o. G2P1001 at 7655w2d being seen today for ongoing prenatal care.  She is currently monitored for the following issues for this low-risk pregnancy and has CHEST PAIN; Klippel Trenaunay syndrome; History of cesarean delivery, currently pregnant; Supervision of other normal pregnancy, antepartum; and Abdominal wall hernia on their problem list.  Patient reports backache.  Contractions: Not present. Vag. Bleeding: None.  Movement: Present. Denies leaking of fluid.   The following portions of the patient's history were reviewed and updated as appropriate: allergies, current medications, past family history, past medical history, past social history, past surgical history and problem list. Problem list updated.  Objective:   Vitals:   03/04/18 1615  BP: 121/75  Pulse: 69  Weight: 250 lb 6.4 oz (113.6 kg)    Fetal Status: Fetal Heart Rate (bpm): 154 Fundal Height: 24 cm Movement: Present     General:  Alert, oriented and cooperative. Patient is in no acute distress.  Skin: Skin is warm and dry. No rash noted.   Cardiovascular: Normal heart rate noted  Respiratory: Normal respiratory effort, no problems with respiration noted  Abdomen: Soft, gravid, appropriate for gestational age. Pain/Pressure: Present     Pelvic:  Cervical exam deferred        Extremities: Normal range of motion.  Edema: Trace  Mental Status: Normal mood and affect. Normal behavior. Normal judgment and thought content.   Urinalysis:      Assessment and Plan:  Pregnancy: G2P1001 at 7155w2d  1. Supervision of other normal pregnancy, antepartum Prescription for pregnancy support belt given to try to alleviate back pain Insurance is not yet active - states she is having problems with Medicaid  2. History of cesarean delivery, currently pregnant Plans VBAC  3. Klippel Trenaunay syndrome Stable  4. Abdominal wall hernia Large hernia noted but is not painful.  To see surgeon  again in April.  Preterm labor symptoms and general obstetric precautions including but not limited to vaginal bleeding, contractions, leaking of fluid and fetal movement were reviewed in detail with the patient. Please refer to After Visit Summary for other counseling recommendations.  Return in about 4 weeks (around 04/01/2018).  Nolene BernheimERRI Hetvi Shawhan, RN, MSN, NP-BC Nurse Practitioner, [facility], [facility] 03/04/2018 8:34 PM

## 2018-03-28 ENCOUNTER — Other Ambulatory Visit: Payer: Self-pay

## 2018-03-28 ENCOUNTER — Encounter (HOSPITAL_COMMUNITY): Payer: Self-pay

## 2018-03-28 ENCOUNTER — Inpatient Hospital Stay (HOSPITAL_COMMUNITY)
Admission: AD | Admit: 2018-03-28 | Discharge: 2018-03-28 | Disposition: A | Discharge status: Home / Self Care | Payer: Medicaid Other | Source: Ambulatory Visit | Attending: Family Medicine | Admitting: Family Medicine

## 2018-03-28 DIAGNOSIS — R102 Pelvic and perineal pain: Secondary | ICD-10-CM | POA: Insufficient documentation

## 2018-03-28 DIAGNOSIS — O26892 Other specified pregnancy related conditions, second trimester: Secondary | ICD-10-CM | POA: Insufficient documentation

## 2018-03-28 DIAGNOSIS — Z3A23 23 weeks gestation of pregnancy: Secondary | ICD-10-CM | POA: Insufficient documentation

## 2018-03-28 DIAGNOSIS — N949 Unspecified condition associated with female genital organs and menstrual cycle: Secondary | ICD-10-CM | POA: Diagnosis not present

## 2018-03-28 DIAGNOSIS — Z3480 Encounter for supervision of other normal pregnancy, unspecified trimester: Secondary | ICD-10-CM

## 2018-03-28 DIAGNOSIS — M545 Low back pain: Secondary | ICD-10-CM | POA: Diagnosis present

## 2018-03-28 LAB — URINALYSIS, ROUTINE W REFLEX MICROSCOPIC
Bilirubin Urine: NEGATIVE
Glucose, UA: 50 mg/dL — AB
Ketones, ur: NEGATIVE mg/dL
Nitrite: NEGATIVE
Protein, ur: NEGATIVE mg/dL
Specific Gravity, Urine: 1.014 (ref 1.005–1.030)
pH: 8 (ref 5.0–8.0)

## 2018-03-28 NOTE — MAU Provider Note (Signed)
History     CSN: [id number]  Arrival date and time: 03/28/18 1310   First Provider Initiated Contact with Patient 03/28/18 1421      Chief Complaint  Patient presents with  . Abdominal Pain  . Back Pain   HPI [name] is a 33 y.o. G2P1001 at [redacted]w[redacted]d who presents to MAU today with complaint of sudden onset lower abdominal pain and low back pain while walking today. She denies vaginal bleeding, LOF or contractions. She denies UTI symptoms. She has not taken anything for pain. Symptoms started less than an hour prior to arrival. She reports normal fetal movement.   OB History    Gravida  2   Para  1   Term  1   Preterm      AB      Living  1     SAB      TAB      Ectopic      Multiple      Live Births  1           Past Medical History:  Diagnosis Date  . 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 Chronic back pain   . Ear malformation   . 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 Hx of varicella   . Klippel Trenaunay syndrome   . Scoliosis   . Scoliosis   . Umbilical hernia     Past Surgical History:  Procedure Laterality Date  . CESAREAN SECTION  04/07/2012   Procedure: CESAREAN SECTION;  Surgeon: Mitchel Honour, DO;  Location: [facility];  Service: Obstetrics;  Laterality: N/A;  Primary Cesarean Section Delivery Baby  Boy  @ [phone],   Apgars 9/9  . EAR CANALOPLASTY    . LEG SURGERY    . MANDIBLE RECONSTRUCTION    . TONSILLECTOMY      Family History  Adopted: Yes  Problem Relation Age of Onset  . Heart disease Mother   . Leukemia Father   . Non-Hodgkin's lymphoma Brother     Social History   Tobacco Use  . Smoking status: Never Smoker  . Smokeless tobacco: Never Used  Substance Use Topics  . Alcohol use: No  . Drug use: No    Allergies:  Allergies  Allergen Reactions  . Sulfa Antibiotics Hives and Itching    No medications prior to admission.    Review of Systems  Constitutional: Negative for fever.   Gastrointestinal: Positive for abdominal pain and nausea. Negative for constipation, diarrhea and vomiting.  Genitourinary: Negative for dysuria, frequency, urgency, vaginal bleeding and vaginal discharge.  Musculoskeletal: Positive for back pain.   Physical Exam   Blood pressure (!) 110/58, pulse (!) 55, temperature 98.4 F (36.9 C), temperature source Oral, resp. rate 20, last menstrual period 10/09/2017, SpO2 99 %, currently breastfeeding.  Physical Exam  Nursing note and vitals reviewed. Constitutional: She is oriented to person, place, and time. She appears well-developed and well-nourished. No distress.  HENT:  Head: Normocephalic and atraumatic.  Cardiovascular: Normal rate.  Respiratory: Effort normal.  GI: Soft. She exhibits no distension and no mass. There is abdominal tenderness (mild to moderate tenderness to palpation of the lower abdomen bilaterally). There is no rebound and no guarding.  Neurological: She is alert and oriented to person, place, and time.  Skin: Skin is warm and dry. No erythema.  Psychiatric: She has a normal mood and affect.  Results for orders placed or performed during the hospital encounter of 03/28/18 (from the past 24 hour(s))  Urinalysis, Routine w reflex microscopic     Status: Abnormal   Collection Time: 03/28/18  1:36 PM  Result Value Ref Range   Color, Urine YELLOW YELLOW   APPearance HAZY (A) CLEAR   Specific Gravity, Urine 1.014 1.005 - 1.030   pH 8.0 5.0 - 8.0   Glucose, UA 50 (A) NEGATIVE mg/dL   Hgb urine dipstick SMALL (A) NEGATIVE   Bilirubin Urine NEGATIVE NEGATIVE   Ketones, ur NEGATIVE NEGATIVE mg/dL   Protein, ur NEGATIVE NEGATIVE mg/dL   Nitrite NEGATIVE NEGATIVE   Leukocytes, UA MODERATE (A) NEGATIVE   RBC / HPF 0-5 0 - 5 RBC/hpf   WBC, UA 0-5 0 - 5 WBC/hpf   Bacteria, UA RARE (A) NONE SEEN   Squamous Epithelial / LPF 0-5 0 - 5   Mucus PRESENT    Fetal Monitoring: Baseline: 140 bpm Variability:  moderate Accelerations: 10 x 10 Decelerations: none Contractions: none  MAU Course  Procedures None  MDM UA today  Cervix closed EFM intermittent due to early gestation, but +FM noted and heard on monitor  Assessment and Plan  A: SIUP at [redacted]w[redacted]d Round ligament pain   P:  Discharge home Tylenol PRN for pain advised  Abdominal binder Increased PO hydration  Preterm labor precautions discussed Patient advised to follow-up with [facility] as scheduled for routine prenatal care  Patient may return to MAU as needed or if her condition were to change or worsen  Vonzella Nipple, PA-C 03/28/2018, 6:10 PM

## 2018-03-28 NOTE — Discharge Instructions (Signed)

## 2018-03-28 NOTE — MAU Note (Signed)
Was walking, and had sudden pain in low back, around to the front, could not stand up straight.  Very hard to walk. (pt in wc).  Started about an hour ago. No GI or GU complaints/changes

## 2018-04-01 ENCOUNTER — Encounter: Payer: Self-pay | Admitting: Nurse Practitioner

## 2018-04-06 ENCOUNTER — Encounter: Payer: Self-pay | Admitting: Nurse Practitioner

## 2018-04-22 ENCOUNTER — Encounter: Payer: Self-pay | Admitting: Nurse Practitioner

## 2018-04-23 ENCOUNTER — Other Ambulatory Visit: Payer: Self-pay

## 2018-04-23 ENCOUNTER — Ambulatory Visit (INDEPENDENT_AMBULATORY_CARE_PROVIDER_SITE_OTHER): Payer: Medicaid Other | Admitting: Obstetrics and Gynecology

## 2018-04-23 VITALS — BP 110/61 | HR 85 | Wt 251.5 lb

## 2018-04-23 DIAGNOSIS — K439 Ventral hernia without obstruction or gangrene: Secondary | ICD-10-CM

## 2018-04-23 DIAGNOSIS — Z3482 Encounter for supervision of other normal pregnancy, second trimester: Secondary | ICD-10-CM

## 2018-04-23 DIAGNOSIS — Z3480 Encounter for supervision of other normal pregnancy, unspecified trimester: Secondary | ICD-10-CM

## 2018-04-23 DIAGNOSIS — O34219 Maternal care for unspecified type scar from previous cesarean delivery: Secondary | ICD-10-CM

## 2018-04-23 DIAGNOSIS — Z23 Encounter for immunization: Secondary | ICD-10-CM | POA: Diagnosis present

## 2018-04-23 NOTE — Progress Notes (Signed)
   PRENATAL VISIT NOTE  Subjective:  Mariah Weaver is a 33 y.o. G2P1001 at [redacted]w[redacted]d being seen today for ongoing prenatal care.  She is currently monitored for the following issues for this low-risk pregnancy and has CHEST PAIN; Klippel Trenaunay syndrome; History of cesarean delivery, currently pregnant; Supervision of other normal pregnancy, antepartum; and Abdominal wall hernia on their problem list.  Patient reports no complaints.  Contractions: Not present. Vag. Bleeding: None.  Movement: Present. Denies leaking of fluid.   The following portions of the patient's history were reviewed and updated as appropriate: allergies, current medications, past family history, past medical history, past social history, past surgical history and problem list. Problem list updated.  Objective:   Vitals:   04/23/18 1609  BP: 110/61  Pulse: 85  Weight: 251 lb 8 oz (114.1 kg)    Fetal Status: Fetal Heart Rate (bpm): 159 Fundal Height: 29 cm Movement: Present     General:  Alert, oriented and cooperative. Patient is in no acute distress.  Skin: Skin is warm and dry. No rash noted.   Cardiovascular: Normal heart rate noted  Respiratory: Normal respiratory effort, no problems with respiration noted  Abdomen: Soft, gravid, appropriate for gestational age.  Pain/Pressure: Present     Pelvic: Cervical exam deferred        Extremities: Normal range of motion.  Edema: Trace  Mental Status: Normal mood and affect. Normal behavior. Normal judgment and thought content.   Assessment and Plan:  Pregnancy: G2P1001 at [redacted]w[redacted]d  1. Supervision of other normal pregnancy, antepartum  - Tdap vaccine greater than or equal to 7yo IM - Inheritest(R) CF/SMA Panel - Genetic Screening - Hemoglobinopathy evaluation - insurance active now, would like genetic screening.  - Not fasting today, she will reschedule for 2 hour GTT   2. History of cesarean delivery, currently pregnant  - Desires repeat; consent signed today     3. Abdominal wall hernia  - plans to meet with her surgeon in April.  - No pain.   Preterm labor symptoms and general obstetric precautions including but not limited to vaginal bleeding, contractions, leaking of fluid and fetal movement were reviewed in detail with the patient. Please refer to After Visit Summary for other counseling recommendations.  Return in about 1 day (around 04/24/2018) for For 2 hour GTT .  No future appointments.  Venia Carbon, NP

## 2018-04-23 NOTE — Progress Notes (Signed)
Pt has been having a lot of low back pain, has been wearing the back brace hurts her in the front. Afraid its too tight.

## 2018-04-23 NOTE — Patient Instructions (Signed)

## 2018-05-04 ENCOUNTER — Encounter: Payer: Self-pay | Admitting: *Deleted

## 2018-05-05 ENCOUNTER — Encounter: Payer: Self-pay | Admitting: *Deleted

## 2018-05-05 LAB — HEMOGLOBINOPATHY EVALUATION
HGB C: 0 %
HGB S: 0 %
HGB VARIANT: 0 %
Hemoglobin A2 Quantitation: 2.3 % (ref 1.8–3.2)
Hemoglobin F Quantitation: 0 % (ref 0.0–2.0)
Hgb A: 97.7 % (ref 96.4–98.8)

## 2018-05-05 LAB — INHERITEST(R) CF/SMA PANEL

## 2018-05-07 ENCOUNTER — Other Ambulatory Visit: Payer: Medicaid Other

## 2018-05-07 ENCOUNTER — Ambulatory Visit (INDEPENDENT_AMBULATORY_CARE_PROVIDER_SITE_OTHER): Payer: Medicaid Other | Admitting: Obstetrics and Gynecology

## 2018-05-07 VITALS — BP 135/78 | HR 76 | Wt 247.0 lb

## 2018-05-07 DIAGNOSIS — Z3483 Encounter for supervision of other normal pregnancy, third trimester: Secondary | ICD-10-CM

## 2018-05-07 DIAGNOSIS — Z3480 Encounter for supervision of other normal pregnancy, unspecified trimester: Secondary | ICD-10-CM

## 2018-05-07 DIAGNOSIS — K439 Ventral hernia without obstruction or gangrene: Secondary | ICD-10-CM

## 2018-05-07 DIAGNOSIS — Z3A29 29 weeks gestation of pregnancy: Secondary | ICD-10-CM

## 2018-05-07 DIAGNOSIS — O26893 Other specified pregnancy related conditions, third trimester: Secondary | ICD-10-CM

## 2018-05-07 DIAGNOSIS — O34219 Maternal care for unspecified type scar from previous cesarean delivery: Secondary | ICD-10-CM

## 2018-05-07 NOTE — Progress Notes (Signed)
   PRENATAL VISIT NOTE  Subjective:  Mariah Weaver is a 33 y.o. G2P1001 at [redacted]w[redacted]d being seen today for ongoing prenatal care.  She is currently monitored for the following issues for this low-risk pregnancy and has CHEST PAIN; Klippel Trenaunay syndrome; History of cesarean delivery, currently pregnant; Supervision of other normal pregnancy, antepartum; and Abdominal wall hernia on their problem list.  Patient reports no complaints.  Contractions: Not present. Vag. Bleeding: None.  Movement: Present. Denies leaking of fluid.   The following portions of the patient's history were reviewed and updated as appropriate: allergies, current medications, past family history, past medical history, past social history, past surgical history and problem list. Problem list updated.  Objective:   Vitals:   05/07/18 1003  BP: 135/78  Pulse: 76  Weight: 247 lb (112 kg)    Fetal Status: Fetal Heart Rate (bpm): 154 Fundal Height: 31 cm Movement: Present     General:  Alert, oriented and cooperative. Patient is in no acute distress.  Skin: Skin is warm and dry. No rash noted.   Cardiovascular: Normal heart rate noted  Respiratory: Normal respiratory effort, no problems with respiration noted  Abdomen: Soft, gravid, appropriate for gestational age.  Pain/Pressure: Present     Pelvic: Cervical exam deferred        Extremities: Normal range of motion.  Edema: Trace  Mental Status: Normal mood and affect. Normal behavior. Normal judgment and thought content.   Assessment and Plan:  Pregnancy: G2P1001 at [redacted]w[redacted]d  1. Supervision of other normal pregnancy, antepartum  - Doing well - Third trimester labs collected today  2. Abdominal wall hernia  Dr. Derrell Lolling; planning surgery in June, [facility]   There are no diagnoses linked to this encounter. Preterm labor symptoms and general obstetric precautions including but not limited to vaginal bleeding, contractions, leaking of fluid and fetal  movement were reviewed in detail with the patient. Please refer to After Visit Summary for other counseling recommendations.  Return in about 2 weeks (around 05/21/2018) for schedule with MD for hernia discussion .  No future appointments.  Venia Carbon, NP

## 2018-05-08 LAB — CBC
Hematocrit: 32.4 % — ABNORMAL LOW (ref 34.0–46.6)
Hemoglobin: 11.4 g/dL (ref 11.1–15.9)
MCH: 35 pg — ABNORMAL HIGH (ref 26.6–33.0)
MCHC: 35.2 g/dL (ref 31.5–35.7)
MCV: 99 fL — ABNORMAL HIGH (ref 79–97)
Platelets: 220 10*3/uL (ref 150–450)
RBC: 3.26 x10E6/uL — ABNORMAL LOW (ref 3.77–5.28)
RDW: 12.8 % (ref 11.7–15.4)
WBC: 6.8 10*3/uL (ref 3.4–10.8)

## 2018-05-08 LAB — RPR: RPR Ser Ql: NONREACTIVE

## 2018-05-08 LAB — GLUCOSE TOLERANCE, 2 HOURS W/ 1HR
Glucose, 1 hour: 197 mg/dL — ABNORMAL HIGH (ref 65–179)
Glucose, 2 hour: 158 mg/dL — ABNORMAL HIGH (ref 65–152)
Glucose, Fasting: 76 mg/dL (ref 65–91)

## 2018-05-08 LAB — HIV ANTIBODY (ROUTINE TESTING W REFLEX): HIV Screen 4th Generation wRfx: NONREACTIVE

## 2018-05-11 ENCOUNTER — Encounter: Payer: Self-pay | Admitting: Obstetrics and Gynecology

## 2018-05-11 DIAGNOSIS — O24419 Gestational diabetes mellitus in pregnancy, unspecified control: Secondary | ICD-10-CM | POA: Insufficient documentation

## 2018-05-19 ENCOUNTER — Telehealth: Payer: Self-pay | Admitting: General Practice

## 2018-05-19 ENCOUNTER — Other Ambulatory Visit: Payer: Self-pay

## 2018-05-19 NOTE — Telephone Encounter (Signed)
Patient called and left message on nurse voicemail line stating she is having a lot of discharge to the point where she is soaking her underwear throughout the day. Patient states she is [redacted] weeks pregnant and does not feel like her water has broken. She is requesting a call back.  Called patient, no answer- left message stating we are trying to reach you to return your phone call. We have received your voicemail message. We have walk in hours tonight from [phone] in which you could be seen. If you feel this is emergent, please go to the hospital for evaluation. You may call us back or send [place] a mychart message with additional questions.

## 2018-05-21 ENCOUNTER — Ambulatory Visit (INDEPENDENT_AMBULATORY_CARE_PROVIDER_SITE_OTHER): Payer: Medicaid Other | Admitting: Obstetrics and Gynecology

## 2018-05-21 ENCOUNTER — Other Ambulatory Visit (HOSPITAL_COMMUNITY)
Admission: RE | Admit: 2018-05-21 | Discharge: 2018-05-21 | Disposition: A | Discharge status: Home / Self Care | Payer: Medicaid Other | Source: Ambulatory Visit | Attending: Obstetrics and Gynecology | Admitting: Obstetrics and Gynecology

## 2018-05-21 VITALS — BP 132/77 | HR 80 | Wt 251.0 lb

## 2018-05-21 DIAGNOSIS — N898 Other specified noninflammatory disorders of vagina: Secondary | ICD-10-CM | POA: Insufficient documentation

## 2018-05-21 DIAGNOSIS — Z3A31 31 weeks gestation of pregnancy: Secondary | ICD-10-CM

## 2018-05-21 DIAGNOSIS — O24419 Gestational diabetes mellitus in pregnancy, unspecified control: Secondary | ICD-10-CM

## 2018-05-21 DIAGNOSIS — O26893 Other specified pregnancy related conditions, third trimester: Secondary | ICD-10-CM

## 2018-05-21 DIAGNOSIS — K439 Ventral hernia without obstruction or gangrene: Secondary | ICD-10-CM

## 2018-05-21 DIAGNOSIS — O0990 Supervision of high risk pregnancy, unspecified, unspecified trimester: Secondary | ICD-10-CM

## 2018-05-21 DIAGNOSIS — O34219 Maternal care for unspecified type scar from previous cesarean delivery: Secondary | ICD-10-CM

## 2018-05-21 NOTE — Progress Notes (Signed)
   PRENATAL VISIT NOTE  Subjective:  Mariah Weaver is a 33 y.o. G2P1001 at [redacted]w[redacted]d being seen today for ongoing prenatal care.  She is currently monitored for the following issues for this high-risk pregnancy and has CHEST PAIN; Klippel Trenaunay syndrome; History of cesarean delivery, currently pregnant; Supervision of high risk pregnancy, antepartum; Abdominal wall hernia; and Gestational diabetes mellitus (GDM) affecting pregnancy on their problem list.  Patient reports several complaints.. Patient concerned that baby is sitting low, having occasional braxton-hicks. Denies leaking. Having some discharge, reports it is "filling underwear." Some itching and some odor. Also having some dizziness, when she feels dizzy she has to sit down. Has happened during her entire pregnancy. Reports some decreased movement from earlier in the pregnancy, reports overall, baby is moving less. Reports kicking 1-2 times per hour, rather than 6 she was feeling earlier in pregnancy.  Contractions: Irritability. Vag. Bleeding: None.  Movement: Present. Denies leaking of fluid.   The following portions of the patient's history were reviewed and updated as appropriate: allergies, current medications, past family history, past medical history, past social history, past surgical history and problem list. Problem list updated.  Objective:   Vitals:   05/21/18 1615  BP: 132/77  Pulse: 80  Weight: 251 lb (113.9 kg)    Fetal Status: Fetal Heart Rate (bpm): 158   Movement: Present     General:  Alert, oriented and cooperative. Patient is in no acute distress.  Skin: Skin is warm and dry. No rash noted.   Cardiovascular: Normal heart rate noted  Respiratory: Normal respiratory effort, no problems with respiration noted  Abdomen: Soft, gravid, appropriate for gestational age.  Pain/Pressure: Present     Pelvic: SSE: thick, white discharge in vagina, cervix vascular appearing and thick appearing        Extremities: Normal  range of motion.  Edema: Trace  Mental Status: Normal mood and affect. Normal behavior. Normal judgment and thought content.   Assessment and Plan:  Pregnancy: G2P1001 at [redacted]w[redacted]d  1. Supervision of high risk pregnancy, antepartum Reviewed strategies for improving back pain, dizziness  2. History of cesarean delivery, currently pregnant For RCS, scheduled today  3. Abdominal wall hernia S/p gen surg consult, planning for surgery in June  4. Gestational diabetes mellitus (GDM) affecting pregnancy Has not seen diabetic education/nutrition Set up appointment today - [place] MFM OB FOLLOW UP; Future - Referral to Nutrition and Diabetes Services  5. Vaginal discharge - Cervicovaginal ancillary only( [place])    Preterm labor symptoms and general obstetric precautions including but not limited to vaginal bleeding, contractions, leaking of fluid and fetal movement were reviewed in detail with the patient. Please refer to After Visit Summary for other counseling recommendations.  Return in about 2 weeks (around 06/04/2018) for OB visit (MD).  Future Appointments  Date Time Provider Department Center  05/25/2018 11:30 AM WH-MFC [place] 1 WH-MFCUS MFC-US  06/04/2018 11:15 AM WOC-EDUCATION [facility]-WOCA WOC  06/04/2018  3:55 PM Reva Bores, MD [facility]    Conan Bowens, MD

## 2018-05-22 ENCOUNTER — Encounter: Payer: Self-pay | Admitting: Obstetrics and Gynecology

## 2018-05-25 ENCOUNTER — Other Ambulatory Visit (HOSPITAL_COMMUNITY): Payer: Self-pay | Admitting: *Deleted

## 2018-05-25 ENCOUNTER — Ambulatory Visit (HOSPITAL_COMMUNITY): Payer: Medicaid Other | Admitting: *Deleted

## 2018-05-25 ENCOUNTER — Ambulatory Visit (HOSPITAL_COMMUNITY)
Admission: RE | Admit: 2018-05-25 | Discharge: 2018-05-25 | Disposition: A | Discharge status: Home / Self Care | Payer: Medicaid Other | Source: Ambulatory Visit | Attending: Obstetrics and Gynecology | Admitting: Obstetrics and Gynecology

## 2018-05-25 ENCOUNTER — Encounter (HOSPITAL_COMMUNITY): Payer: Self-pay

## 2018-05-25 VITALS — BP 116/60 | HR 64 | Wt 253.0 lb

## 2018-05-25 DIAGNOSIS — O99213 Obesity complicating pregnancy, third trimester: Secondary | ICD-10-CM | POA: Diagnosis not present

## 2018-05-25 DIAGNOSIS — Z3A32 32 weeks gestation of pregnancy: Secondary | ICD-10-CM

## 2018-05-25 DIAGNOSIS — O24410 Gestational diabetes mellitus in pregnancy, diet controlled: Secondary | ICD-10-CM

## 2018-05-25 DIAGNOSIS — O24419 Gestational diabetes mellitus in pregnancy, unspecified control: Secondary | ICD-10-CM | POA: Insufficient documentation

## 2018-05-25 DIAGNOSIS — O34219 Maternal care for unspecified type scar from previous cesarean delivery: Secondary | ICD-10-CM | POA: Diagnosis not present

## 2018-05-25 DIAGNOSIS — O0990 Supervision of high risk pregnancy, unspecified, unspecified trimester: Secondary | ICD-10-CM | POA: Insufficient documentation

## 2018-05-25 DIAGNOSIS — Z362 Encounter for other antenatal screening follow-up: Secondary | ICD-10-CM

## 2018-05-25 DIAGNOSIS — O2693 Pregnancy related conditions, unspecified, third trimester: Secondary | ICD-10-CM | POA: Diagnosis not present

## 2018-05-25 LAB — CERVICOVAGINAL ANCILLARY ONLY
Bacterial vaginitis: NEGATIVE
Candida vaginitis: NEGATIVE

## 2018-06-02 ENCOUNTER — Telehealth: Payer: Self-pay | Admitting: Family Medicine

## 2018-06-02 NOTE — Telephone Encounter (Signed)
Attempted to call patient to inform her of the office restrictions due to the virus going on. No answer, left a detailed message with the restrictions and office number if needing to rescheduled.

## 2018-06-04 ENCOUNTER — Ambulatory Visit (INDEPENDENT_AMBULATORY_CARE_PROVIDER_SITE_OTHER): Payer: Medicaid Other | Admitting: Family Medicine

## 2018-06-04 ENCOUNTER — Other Ambulatory Visit: Payer: Self-pay

## 2018-06-04 ENCOUNTER — Ambulatory Visit: Payer: Medicaid Other | Admitting: *Deleted

## 2018-06-04 ENCOUNTER — Other Ambulatory Visit: Payer: Self-pay | Admitting: *Deleted

## 2018-06-04 ENCOUNTER — Encounter: Payer: Medicaid Other | Attending: Obstetrics & Gynecology | Admitting: *Deleted

## 2018-06-04 VITALS — BP 115/71 | HR 88 | Wt 248.1 lb

## 2018-06-04 DIAGNOSIS — Z3A00 Weeks of gestation of pregnancy not specified: Secondary | ICD-10-CM | POA: Diagnosis not present

## 2018-06-04 DIAGNOSIS — O0990 Supervision of high risk pregnancy, unspecified, unspecified trimester: Secondary | ICD-10-CM

## 2018-06-04 DIAGNOSIS — O24419 Gestational diabetes mellitus in pregnancy, unspecified control: Secondary | ICD-10-CM | POA: Diagnosis not present

## 2018-06-04 DIAGNOSIS — R600 Localized edema: Secondary | ICD-10-CM

## 2018-06-04 DIAGNOSIS — Z3A33 33 weeks gestation of pregnancy: Secondary | ICD-10-CM

## 2018-06-04 DIAGNOSIS — O34219 Maternal care for unspecified type scar from previous cesarean delivery: Secondary | ICD-10-CM

## 2018-06-04 MED ORDER — ACCU-CHEK GUIDE W/DEVICE KIT: 1.0000 | PACK | Freq: Once | 0 refills | Status: AC

## 2018-06-04 MED ORDER — ACCU-CHEK FASTCLIX LANCETS MISC: 1.0000 | Freq: Four times a day (QID) | 12 refills | Status: DC

## 2018-06-04 MED ORDER — GLUCOSE BLOOD VI STRP: ORAL_STRIP | 12 refills | Status: DC

## 2018-06-04 NOTE — Patient Instructions (Signed)

## 2018-06-04 NOTE — Progress Notes (Signed)
  Patient was seen on 06/04/2018 for Gestational Diabetes self-management. EDD 07/13/2018. Patient states no history of GDM. Diet history obtained. 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 She is now home for the duration of her pregnancy with the COVID 19 concern.  Patient eats good variety of all food groups. Beverages include OJ, water, flavored water and diet soda.  The following learning objectives were met by the patient :   States the definition of Gestational Diabetes  States why dietary management is important in controlling blood glucose  Describes the effects of carbohydrates on blood glucose levels  Demonstrates ability to create a balanced meal plan  Demonstrates carbohydrate counting   States when to check blood glucose levels  Demonstrates proper blood glucose monitoring techniques  States the effect of stress and exercise on blood glucose levels  States the importance of limiting caffeine and abstaining from alcohol and smoking  Plan:  Aim for 3 Carb Choices per meal (45 grams) +/- 1 either way  Aim for 1-2 Carbs per snack Begin reading food labels for Total Carbohydrate of foods If OK with your MD, consider  increasing your activity level by walking, Arm Chair Exercises or other activity daily as tolerated Begin checking BG before breakfast and 2 hours after first bite of breakfast, lunch and dinner as directed by MD  Bring Log Book/Sheet and meter to every medical appointment OR use Baby Scripts (see below) Baby Scripts:  Patient was introduced to [employer] and plans to use as record of BG electronically  Take medication if directed by MD  Blood glucose monitor Rx called into pharmacy: Accu Check Guide with Fast Clix drums Patient instructed to test pre breakfast and 2 hours each meal as directed by MD  Patient instructed to monitor glucose levels: FBS: 60 - 95  mg/dl 2 hour: <120 mg/dl  Patient received the following handouts:  Nutrition Diabetes and Pregnancy  Carbohydrate Counting List  BG Log Sheet  Patient will be seen for follow-up as needed.

## 2018-06-05 ENCOUNTER — Ambulatory Visit (HOSPITAL_COMMUNITY)
Admission: RE | Admit: 2018-06-05 | Discharge: 2018-06-05 | Disposition: A | Discharge status: Home / Self Care | Payer: Medicaid Other | Source: Ambulatory Visit | Attending: Family Medicine | Admitting: Family Medicine

## 2018-06-05 ENCOUNTER — Telehealth: Payer: Self-pay | Admitting: *Deleted

## 2018-06-05 DIAGNOSIS — R600 Localized edema: Secondary | ICD-10-CM

## 2018-06-05 NOTE — Progress Notes (Signed)
   PRENATAL VISIT NOTE  Subjective:  Mariah Weaver is a 33 y.o. G2P1001 at [redacted]w[redacted]d being seen today for ongoing prenatal care.  She is currently monitored for the following issues for this high-risk pregnancy and has CHEST PAIN; Klippel Trenaunay syndrome; History of cesarean delivery, currently pregnant; Supervision of high risk pregnancy, antepartum; Abdominal wall hernia; and Gestational diabetes mellitus (GDM) affecting pregnancy on their problem list.  Patient reports contractions since last visit and pelvic pain. Has significant increasing edema in right leg. No pain Contractions: Irritability. Vag. Bleeding: None.  Movement: Present. Denies leaking of fluid.   The following portions of the patient's history were reviewed and updated as appropriate: allergies, current medications, past family history, past medical history, past social history, past surgical history and problem list.   Objective:   Vitals:   06/04/18 1610  BP: 115/71  Pulse: 88  Weight: 248 lb 1.6 oz (112.5 kg)    Fetal Status: Fetal Heart Rate (bpm): 155   Movement: Present     General:  Alert, oriented and cooperative. Patient is in no acute distress.  Skin: Skin is warm and dry. No rash noted.   Cardiovascular: Normal heart rate noted  Respiratory: Normal respiratory effort, no problems with respiration noted  Abdomen: Soft, gravid, appropriate for gestational age.  Pain/Pressure: Present     Pelvic: Cervical exam deferred        Extremities: Normal range of motion.  R>>L edema  Mental Status: Normal mood and affect. Normal behavior. Normal judgment and thought content.   Assessment and Plan:  Pregnancy: G2P1001 at [redacted]w[redacted]d 1. Gestational diabetes mellitus (GDM) affecting pregnancy Had teaching today To get meter Can call to review CBGs next week  2. Supervision of high risk pregnancy, antepartum   3. History of cesarean delivery, currently pregnant C-S is scheduled  4. Edema of right lower extremity  Check Doppler - VAS [place] LOWER EXTREMITY VENOUS (DVT); Future  Preterm labor symptoms and general obstetric precautions including but not limited to vaginal bleeding, contractions, leaking of fluid and fetal movement were reviewed in detail with the patient. Please refer to After Visit Summary for other counseling recommendations.   Return in 2 weeks (on 06/18/2018) for [facility].  Future Appointments  Date Time Provider Department Center  06/23/2018  3:20 PM [facility] NURSE WH-MFC MFC-US  06/23/2018  3:30 PM WH-MFC [place] 5 WH-MFCUS MFC-US  06/24/2018  2:15 PM Conan Bowens, MD [facility] WOC    Reva Bores, MD

## 2018-06-05 NOTE — Telephone Encounter (Signed)
Al left a voicemail last pm that she saw Bev , Diabetes educator and she went to get her meter and strips but was told medicaid refused because they have that she has another insurance. She states she doesn't have that insurance anymore and they told her to call her Careers information officer. She wanted to let us know .   I called [place] and informed her I got her message. She states she has called her Careers information officer and left a message. I informed her I will pass message on to  Bev, but she is not her today . I informed her to call her caseworker again and if she continues to have issue getting meter to let us know, we may have other options. I also informed her Bev may call  Her or may have [place] call her . She voices understanding.

## 2018-06-05 NOTE — Progress Notes (Signed)
Right lower extremity venous duplex completed. Refer to "CV Proc" under chart review to view preliminary results.  06/05/2018 1:46 PM Gertie Fey, MHA, RVT, RDCS, RDMS

## 2018-06-09 ENCOUNTER — Encounter: Payer: Self-pay | Admitting: *Deleted

## 2018-06-17 ENCOUNTER — Telehealth: Payer: Self-pay | Admitting: *Deleted

## 2018-06-17 ENCOUNTER — Other Ambulatory Visit: Payer: Self-pay

## 2018-06-17 ENCOUNTER — Encounter (HOSPITAL_COMMUNITY): Payer: Self-pay

## 2018-06-17 ENCOUNTER — Inpatient Hospital Stay (HOSPITAL_COMMUNITY)
Admission: AD | Admit: 2018-06-17 | Discharge: 2018-06-17 | Disposition: A | Discharge status: Home / Self Care | Payer: Medicaid Other | Attending: Obstetrics & Gynecology | Admitting: Obstetrics & Gynecology

## 2018-06-17 DIAGNOSIS — M25511 Pain in right shoulder: Secondary | ICD-10-CM | POA: Diagnosis not present

## 2018-06-17 DIAGNOSIS — O26893 Other specified pregnancy related conditions, third trimester: Secondary | ICD-10-CM | POA: Diagnosis present

## 2018-06-17 DIAGNOSIS — R51 Headache: Secondary | ICD-10-CM | POA: Diagnosis not present

## 2018-06-17 DIAGNOSIS — O24419 Gestational diabetes mellitus in pregnancy, unspecified control: Secondary | ICD-10-CM | POA: Diagnosis not present

## 2018-06-17 DIAGNOSIS — R109 Unspecified abdominal pain: Secondary | ICD-10-CM | POA: Insufficient documentation

## 2018-06-17 DIAGNOSIS — M25512 Pain in left shoulder: Secondary | ICD-10-CM | POA: Insufficient documentation

## 2018-06-17 DIAGNOSIS — N898 Other specified noninflammatory disorders of vagina: Secondary | ICD-10-CM | POA: Diagnosis not present

## 2018-06-17 DIAGNOSIS — M549 Dorsalgia, unspecified: Secondary | ICD-10-CM

## 2018-06-17 DIAGNOSIS — M419 Scoliosis, unspecified: Secondary | ICD-10-CM | POA: Diagnosis not present

## 2018-06-17 DIAGNOSIS — R079 Chest pain, unspecified: Secondary | ICD-10-CM | POA: Diagnosis not present

## 2018-06-17 DIAGNOSIS — Z3A35 35 weeks gestation of pregnancy: Secondary | ICD-10-CM | POA: Diagnosis not present

## 2018-06-17 DIAGNOSIS — G8929 Other chronic pain: Secondary | ICD-10-CM | POA: Insufficient documentation

## 2018-06-17 DIAGNOSIS — R309 Painful micturition, unspecified: Secondary | ICD-10-CM | POA: Insufficient documentation

## 2018-06-17 DIAGNOSIS — Q872 Congenital malformation syndromes predominantly involving limbs: Secondary | ICD-10-CM | POA: Insufficient documentation

## 2018-06-17 DIAGNOSIS — O0990 Supervision of high risk pregnancy, unspecified, unspecified trimester: Secondary | ICD-10-CM

## 2018-06-17 DIAGNOSIS — O9989 Other specified diseases and conditions complicating pregnancy, childbirth and the puerperium: Secondary | ICD-10-CM | POA: Diagnosis not present

## 2018-06-17 LAB — URINALYSIS, ROUTINE W REFLEX MICROSCOPIC
Bilirubin Urine: NEGATIVE
Glucose, UA: NEGATIVE mg/dL
Ketones, ur: NEGATIVE mg/dL
Nitrite: NEGATIVE
Protein, ur: NEGATIVE mg/dL
Specific Gravity, Urine: 1.005 (ref 1.005–1.030)
pH: 7 (ref 5.0–8.0)

## 2018-06-17 LAB — WET PREP, GENITAL
Clue Cells Wet Prep HPF POC: NONE SEEN
Sperm: NONE SEEN
Trich, Wet Prep: NONE SEEN
Yeast Wet Prep HPF POC: NONE SEEN

## 2018-06-17 NOTE — Telephone Encounter (Signed)
Mariah Weaver called and left a message she is 9 months pregnant and is experiencing some pain- states she is wondering if is active labor.  States having uc's a few minutes apart.   I called [name] and she reports contractions every 5-10 minutes apart that eased for a little while and now are back. She denies any leaking or bleeding. I advised her since she is [redacted] weeks pregnant to go to MAU for evaluation for preterm labor. She voices understanding.

## 2018-06-17 NOTE — MAU Provider Note (Signed)
History     CSN: [id number]  Arrival date and time: 06/17/18 1614   First Provider Initiated Contact with Patient 06/17/18 1704      Chief Complaint  Patient presents with  . Abdominal Pain  . Back Pain  . Vaginal Discharge   Mariah Weaver presents with abdominal pain, chest pain, back pain and headache She reports that symptoms started around 4am this morning She has taken 1 dose of tylenol with improvement Symptoms this morning lasted about 2 hours During that 2 hours, she reports rhythmic pain that starts in her low back and radiates up to between her shoulder blades and head with pain in bilateral abdomen The pain lasted about 3 minutes and was about 5 minutes apart for that 2 hours Then the pain improved and has been on and off for shorter periods of time after She also reports that she feels like she's expanding sideways and her skin feels tight from expanding Her baby also feels much lower this time than her first pregnancy She is also leaking fluid several times a day that is enough to wet her underwear but not enough to run down her legs She is urinating more often than she was earlier in her pregnancy, but just a small amount of urine She reports burning with urination She also has vaginal itching  Denies vaginal bleeding First cesarean section was due to arrest of descent after pushing for 3 hours She elected for repeat because she was told there's a bone in her pelvis that is in the way of her baby coming out She is also very concerned about covid 19 and delivery later after the numbers are worse     Past Medical History:  Diagnosis Date  . 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 Chronic back pain   . Ear malformation   . 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 Hx of varicella   . Klippel Trenaunay syndrome   . Scoliosis   . Scoliosis   . Umbilical hernia     Past Surgical History:  Procedure Laterality Date  . CESAREAN SECTION  04/07/2012   Procedure: CESAREAN SECTION;  Surgeon: Mitchel Honour, DO;  Location: [facility];  Service: Obstetrics;  Laterality: N/A;  Primary Cesarean Section Delivery Baby  Boy  @ [phone],   [place] 9/9  . EAR CANALOPLASTY    . LEG SURGERY    . MANDIBLE RECONSTRUCTION    . TONSILLECTOMY      Family History  Adopted: Yes  Problem Relation Age of Onset  . Heart disease Mother   . Leukemia Father   . Non-Hodgkin's lymphoma Brother     Social History   Tobacco Use  . Smoking status: Never Smoker  . Smokeless tobacco: Never Used  Substance Use Topics  . Alcohol use: No  . Drug use: No    Allergies:  Allergies  Allergen Reactions  . Sulfa Antibiotics Hives and Itching    Medications Prior to Admission  Medication Sig Dispense Refill Last Dose  . acetaminophen (TYLENOL) 500 MG tablet Take 1,000 mg by mouth every 6 (six) hours as needed for mild pain, moderate pain or headache.   06/17/2018 at Unknown time  . Prenatal Multivit-Min-Fe-FA (PRENATAL VITAMINS PO) Take 1 tablet by mouth daily.   06/17/2018 at Unknown time  . Accu-Chek FastClix Lancets MISC 1 Device by Percutaneous route 4 (four) times daily. (Patient not taking: Reported on  06/04/2018) 100 each 12 Not Taking  . Elastic Bandages & Supports (COMFORT FIT MATERNITY SUPP LG) MISC One maternity support belt to fit client (Patient not taking: Reported on 05/21/2018) 1 each 0 Not Taking  . glucose blood (ACCU-CHEK GUIDE) test strip Use as instructed QID (Patient not taking: Reported on 06/04/2018) 100 each 12 Not Taking    Review of Systems Physical Exam   Blood pressure 121/70, pulse 64, temperature 98.6 F (37 C), temperature source Oral, resp. rate 18, height 5' 4.5" (1.638 m), weight 112.9 kg, last menstrual period 10/09/2017, SpO2 100 %, currently breastfeeding.  Physical Exam  Constitutional: She is oriented to person, place, and time. She appears well-developed and well-nourished. No distress.  HENT:  Head: Normocephalic.  Eyes: Pupils are  equal, round, and reactive to light. Right eye exhibits no discharge. Left eye exhibits no discharge. No scleral icterus.  Cardiovascular: Normal rate and regular rhythm.  Respiratory: Effort normal. No respiratory distress.  GI: Soft. She exhibits no distension. There is no abdominal tenderness. There is no rebound and no guarding.  Gravid. Large hernia in central abdomen.    Neurological: She is alert and oriented to person, place, and time. No cranial nerve deficit.  Skin: She is not diaphoretic.  Psychiatric: Her behavior is normal. Judgment and thought content normal.  anxious  FHT: basline 140s/mod var/+ acels/no decels/no contractions on toco    GU exam: no pooling on speculum exam; scant amount of yogurt like white discharge. Dilation 1.5cm/40/-3 with scant amount of bloody show on glove   MAU Course  Procedures  MDM Patient with nearly pan-positive ROS, however no signs or symptoms of labor, ROM or preE. HA resolved after tylenol with normal blood pressures today and throughout pregnancy. No contractions and only 1.5cm on exam. Wet prep negative and UA without concern for infection.  Reactive NST. Patient deemed safe for discharge.  Assessment and Plan  Back pain affecting pregnancy in third trimester - reassurance provided that mild contractions normal at this point in pregnancy as well as some cervical dilation - not currently in labor, no signs of ROM, fetal distress, normal vital signs - continue tylenol PRN, encourage hydration - return precautions discussed in detail - plan to follow up as scheduled or sooner PRN  - patient understands and agrees to the discharge plan   Gwenevere Abbot 06/17/2018, 5:36 PM

## 2018-06-17 NOTE — MAU Note (Signed)
Woke up ar 4 a.m.  Both her stomach and back has been tightening.  Going off and off all day. Has been peeing a lot.  No bleeding., ? Watery d/c today. Having tightness in upper chest, started this morning.

## 2018-06-23 ENCOUNTER — Telehealth: Payer: Self-pay | Admitting: Medical

## 2018-06-23 ENCOUNTER — Encounter: Payer: Self-pay | Admitting: Student

## 2018-06-23 ENCOUNTER — Ambulatory Visit (HOSPITAL_COMMUNITY): Payer: Medicaid Other

## 2018-06-23 ENCOUNTER — Ambulatory Visit (HOSPITAL_COMMUNITY): Payer: Self-pay

## 2018-06-23 NOTE — Telephone Encounter (Signed)
Called the patient due to missed appointment. Stated she was not informed of the change of appointment. Rescheduled and also educated of the new address.

## 2018-06-24 ENCOUNTER — Encounter: Payer: Self-pay | Admitting: Obstetrics and Gynecology

## 2018-06-26 ENCOUNTER — Encounter (HOSPITAL_COMMUNITY): Payer: Self-pay | Admitting: *Deleted

## 2018-06-26 ENCOUNTER — Other Ambulatory Visit: Payer: Self-pay

## 2018-06-26 ENCOUNTER — Inpatient Hospital Stay (HOSPITAL_COMMUNITY)
Admission: AD | Admit: 2018-06-26 | Discharge: 2018-06-26 | Disposition: A | Discharge status: Home / Self Care | Payer: Medicaid Other | Attending: Obstetrics and Gynecology | Admitting: Obstetrics and Gynecology

## 2018-06-26 DIAGNOSIS — O4703 False labor before 37 completed weeks of gestation, third trimester: Secondary | ICD-10-CM | POA: Insufficient documentation

## 2018-06-26 DIAGNOSIS — O479 False labor, unspecified: Secondary | ICD-10-CM

## 2018-06-26 LAB — GLUCOSE, CAPILLARY: Glucose-Capillary: 101 mg/dL — ABNORMAL HIGH (ref 70–99)

## 2018-06-26 NOTE — MAU Note (Signed)
Dr Aneta Mins notified of pt's complaints. VE, FHR tracing, orders received to discharge home

## 2018-06-26 NOTE — MAU Note (Addendum)
Pt presents to MAU with complaints of contractions for 3 days. Denies any vaginal bleeding or LOF. +FM  Pt states she is GDM but has not gotten insurance to approve glucose strips or monitor so she has not been checking her blood sugar

## 2018-06-26 NOTE — Discharge Instructions (Signed)
Braxton Hicks Contractions °Contractions of the uterus can occur throughout pregnancy, but they are not always a sign that you are in labor. You may have practice contractions called Braxton Hicks contractions. These false labor contractions are sometimes confused with true labor. °What are Braxton Hicks contractions? °Braxton Hicks contractions are tightening movements that occur in the muscles of the uterus before labor. Unlike true labor contractions, these contractions do not result in opening (dilation) and thinning of the cervix. Toward the end of pregnancy (32-34 weeks), Braxton Hicks contractions can happen more often and may become stronger. These contractions are sometimes difficult to tell apart from true labor because they can be very uncomfortable. You should not feel embarrassed if you go to the hospital with false labor. °Sometimes, the only way to tell if you are in true labor is for your health care provider to look for changes in the cervix. The health care provider will do a physical exam and may monitor your contractions. If you are not in true labor, the exam should show that your cervix is not dilating and your water has not broken. °If there are no other health problems associated with your pregnancy, it is completely safe for you to be sent home with false labor. You may continue to have Braxton Hicks contractions until you go into true labor. °How to tell the difference between true labor and false labor °True labor °· Contractions last 30-70 seconds. °· Contractions become very regular. °· Discomfort is usually felt in the top of the uterus, and it spreads to the lower abdomen and low back. °· Contractions do not go away with walking. °· Contractions usually become more intense and increase in frequency. °· The cervix dilates and gets thinner. °False labor °· Contractions are usually shorter and not as strong as true labor contractions. °· Contractions are usually irregular. °· Contractions  are often felt in the front of the lower abdomen and in the groin. °· Contractions may go away when you walk around or change positions while lying down. °· Contractions get weaker and are shorter-lasting as time goes on. °· The cervix usually does not dilate or become thin. °Follow these instructions at home: ° °· Take over-the-counter and prescription medicines only as told by your health care provider. °· Keep up with your usual exercises and follow other instructions from your health care provider. °· Eat and drink lightly if you think you are going into labor. °· If Braxton Hicks contractions are making you uncomfortable: °? Change your position from lying down or resting to walking, or change from walking to resting. °? Sit and rest in a tub of warm water. °? Drink enough fluid to keep your urine pale yellow. Dehydration may cause these contractions. °? Do slow and deep breathing several times an hour. °· Keep all follow-up prenatal visits as told by your health care provider. This is important. °Contact a health care provider if: °· You have a fever. °· You have continuous pain in your abdomen. °Get help right away if: °· Your contractions become stronger, more regular, and closer together. °· You have fluid leaking or gushing from your vagina. °· You pass blood-tinged mucus (bloody show). °· You have bleeding from your vagina. °· You have low back pain that you never had before. °· You feel your baby’s head pushing down and causing pelvic pressure. °· Your baby is not moving inside you as much as it used to. °Summary °· Contractions that occur before labor are   called Braxton Hicks contractions, false labor, or practice contractions.  Braxton Hicks contractions are usually shorter, weaker, farther apart, and less regular than true labor contractions. True labor contractions usually become progressively stronger and regular, and they become more frequent.  Manage discomfort from [facility] contractions  by changing position, resting in a warm bath, drinking plenty of water, or practicing deep breathing. This information is not intended to replace advice given to you by your health care provider. Make sure you discuss any questions you have with your health care provider. Document Released: 07/18/2016 Document Revised: 12/17/2016 Document Reviewed: 07/18/2016 Elsevier Interactive Patient Education  2019 [employer].  Third Trimester of Pregnancy  The third trimester is from week 28 through week 40 (months 7 through 9). This trimester is when your unborn baby (fetus) is growing very fast. At the end of the ninth month, the unborn baby is about 20 inches in length. It weighs about 6-10 pounds. Follow these instructions at home: Medicines  Take over-the-counter and prescription medicines only as told by your doctor. Some medicines are safe and some medicines are not safe during pregnancy.  Take a prenatal vitamin that contains at least 600 micrograms (mcg) of folic acid.  If you have trouble pooping (constipation), take medicine that will make your stool soft (stool softener) if your doctor approves. Eating and drinking   Eat regular, healthy meals.  Avoid raw meat and uncooked cheese.  If you get low calcium from the food you eat, talk to your doctor about taking a daily calcium supplement.  Eat four or five small meals rather than three large meals a day.  Avoid foods that are high in fat and sugars, such as fried and sweet foods.  To prevent constipation: ? Eat foods that are high in fiber, like fresh fruits and vegetables, whole grains, and beans. ? Drink enough fluids to keep your pee (urine) clear or pale yellow. Activity  Exercise only as told by your doctor. Stop exercising if you start to have cramps.  Avoid heavy lifting, wear low heels, and sit up straight.  Do not exercise if it is too hot, too humid, or if you are in a place of great height (high altitude).  You  may continue to have sex unless your doctor tells you not to. Relieving pain and discomfort  Wear a good support bra if your breasts are tender.  Take frequent breaks and rest with your legs raised if you have leg cramps or low back pain.  Take warm water baths (sitz baths) to soothe pain or discomfort caused by hemorrhoids. Use hemorrhoid cream if your doctor approves.  If you develop puffy, bulging veins (varicose veins) in your legs: ? Wear support hose or compression stockings as told by your doctor. ? Raise (elevate) your feet for 15 minutes, 3-4 times a day. ? Limit salt in your food. Safety  Wear your seat belt when driving.  Make a list of emergency phone numbers, including numbers for family, friends, the hospital, and police and fire departments. Preparing for your baby's arrival To prepare for the arrival of your baby:  Take prenatal classes.  Practice driving to the hospital.  Visit the hospital and tour the maternity area.  Talk to your work about taking leave once the baby comes.  Pack your hospital bag.  Prepare the baby's room.  Go to your doctor visits.  Buy a rear-facing car seat. Learn how to install it in your car. General instructions  Do  Do not use hot tubs, steam rooms, or saunas.  Do not use any products that contain nicotine or tobacco, such as cigarettes and e-cigarettes. If you need help quitting, ask your doctor.  Do not drink alcohol.  Do not douche or use tampons or scented sanitary pads.  Do not cross your legs for long periods of time.  Do not travel for long distances unless you must. Only do so if your doctor says it is okay.  Visit your dentist if you have not gone during your pregnancy. Use a soft toothbrush to brush your teeth. Be gentle when you floss.  Avoid cat litter boxes and soil used by cats. These carry germs that can cause birth defects in the baby and can cause a loss of your baby (miscarriage) or stillbirth.  Keep all  your prenatal visits as told by your doctor. This is important. Contact a doctor if:  You are not sure if you are in labor or if your water has broken.  You are dizzy.  You have mild cramps or pressure in your lower belly.  You have a nagging pain in your belly area.  You continue to feel sick to your stomach, you throw up, or you have watery poop.  You have bad smelling fluid coming from your vagina.  You have pain when you pee. Get help right away if:  You have a fever.  You are leaking fluid from your vagina.  You are spotting or bleeding from your vagina.  You have severe belly cramps or pain.  You lose or gain weight quickly.  You have trouble catching your breath and have chest pain.  You notice sudden or extreme puffiness (swelling) of your face, hands, ankles, feet, or legs.  You have not felt the baby move in over an hour.  You have severe headaches that do not go away with medicine.  You have trouble seeing.  You are leaking, or you are having a gush of fluid, from your vagina before you are 37 weeks.  You have regular belly spasms (contractions) before you are 37 weeks. Summary  The third trimester is from week 28 through week 40 (months 7 through 9). This time is when your unborn baby is growing very fast.  Follow your doctor's advice about medicine, food, and activity.  Get ready for the arrival of your baby by taking prenatal classes, getting all the baby items ready, preparing the baby's room, and visiting your doctor to be checked.  Get help right away if you are bleeding from your vagina, or you have chest pain and trouble catching your breath, or if you have not felt your baby move in over an hour. This information is not intended to replace advice given to you by your health care provider. Make sure you discuss any questions you have with your health care provider. Document Released: 05/29/2009 Document Revised: 04/09/2016 Document Reviewed:  04/09/2016 Elsevier Interactive Patient Education  2019 [employer]vier Inc.  

## 2018-06-26 NOTE — MAU Note (Signed)
I have communicated with Dr Aneta Mins and reviewed vital signs:  Vitals:   06/26/18 1217  BP: 133/70  Pulse: 96  Resp: 18  Temp: 98.1 F (36.7 C)  SpO2: 99%    Vaginal exam:  Dilation: Fingertip Effacement (%): Thick Cervical Position: Posterior Exam by:: Marialena Wollen rn,   Also reviewed contraction pattern and that non-stress test is reactive.  It has been documented that patient is contracting occasional with UI with one  cervical change over one  hour not indicating active labor.  Patient denies any other complaints.  Based on this report provider has given order for discharge.  A discharge order and diagnosis entered by a provider.   Labor discharge instructions reviewed with patient.

## 2018-06-26 NOTE — Patient Instructions (Addendum)
Maryam [place] Kimmel  06/26/2018   Your procedure is scheduled on:  07/13/2018  Arrive at 1000 at Entrance C on [address] at [facility]  and [address]. You are invited to use the FREE valet parking or use the Visitor's parking deck.  Pick up the phone at the desk and dial [phone].  Call this number if you have problems the morning of surgery: [phone]  Remember:   Do not eat food:(After Midnight) Desps de medianoche.  Do not drink clear liquids: (After Midnight) Desps de medianoche.  Take these medicines the morning of surgery with A SIP OF WATER:  none   Do not wear jewelry, make-up or nail polish.  Do not wear lotions, powders, or perfumes. Do not wear deodorant.  Do not shave 48 hours prior to surgery.  Do not bring valuables to the hospital.  [facility] is not   responsible for any belongings or valuables brought to the hospital.  Contacts, dentures or bridgework may not be worn into surgery.  Leave suitcase in the car. After surgery it may be brought to your room.  For patients admitted to the hospital, checkout time is 11:00 AM the day of              discharge.      Please read over the following fact sheets that you were given:     Preparing for Surgery

## 2018-06-29 ENCOUNTER — Encounter (HOSPITAL_COMMUNITY): Payer: Self-pay

## 2018-06-29 ENCOUNTER — Encounter: Payer: Self-pay | Admitting: Obstetrics and Gynecology

## 2018-06-29 ENCOUNTER — Encounter: Payer: Self-pay | Admitting: Advanced Practice Midwife

## 2018-06-30 ENCOUNTER — Ambulatory Visit (HOSPITAL_COMMUNITY)
Admission: RE | Admit: 2018-06-30 | Discharge: 2018-06-30 | Disposition: A | Discharge status: Home / Self Care | Payer: Medicaid Other | Source: Ambulatory Visit | Attending: Maternal & Fetal Medicine | Admitting: Maternal & Fetal Medicine

## 2018-06-30 ENCOUNTER — Ambulatory Visit (HOSPITAL_COMMUNITY): Payer: Medicaid Other | Admitting: *Deleted

## 2018-06-30 ENCOUNTER — Encounter: Payer: Self-pay | Admitting: Student

## 2018-06-30 ENCOUNTER — Other Ambulatory Visit: Payer: Self-pay

## 2018-06-30 ENCOUNTER — Encounter (HOSPITAL_COMMUNITY): Payer: Self-pay | Admitting: *Deleted

## 2018-06-30 ENCOUNTER — Other Ambulatory Visit (HOSPITAL_COMMUNITY): Payer: Self-pay | Admitting: Maternal & Fetal Medicine

## 2018-06-30 DIAGNOSIS — Z3A37 37 weeks gestation of pregnancy: Secondary | ICD-10-CM

## 2018-06-30 DIAGNOSIS — O0990 Supervision of high risk pregnancy, unspecified, unspecified trimester: Secondary | ICD-10-CM

## 2018-06-30 DIAGNOSIS — O34219 Maternal care for unspecified type scar from previous cesarean delivery: Secondary | ICD-10-CM

## 2018-06-30 DIAGNOSIS — O24419 Gestational diabetes mellitus in pregnancy, unspecified control: Secondary | ICD-10-CM | POA: Insufficient documentation

## 2018-06-30 DIAGNOSIS — O99213 Obesity complicating pregnancy, third trimester: Secondary | ICD-10-CM

## 2018-06-30 DIAGNOSIS — O24410 Gestational diabetes mellitus in pregnancy, diet controlled: Secondary | ICD-10-CM | POA: Diagnosis not present

## 2018-06-30 DIAGNOSIS — O2693 Pregnancy related conditions, unspecified, third trimester: Secondary | ICD-10-CM

## 2018-06-30 DIAGNOSIS — O368190 Decreased fetal movements, unspecified trimester, not applicable or unspecified: Secondary | ICD-10-CM | POA: Insufficient documentation

## 2018-06-30 DIAGNOSIS — Z362 Encounter for other antenatal screening follow-up: Secondary | ICD-10-CM | POA: Diagnosis not present

## 2018-07-01 ENCOUNTER — Other Ambulatory Visit (HOSPITAL_COMMUNITY)
Admission: RE | Admit: 2018-07-01 | Discharge: 2018-07-01 | Disposition: A | Discharge status: Home / Self Care | Payer: Medicaid Other | Source: Ambulatory Visit | Attending: Obstetrics and Gynecology | Admitting: Obstetrics and Gynecology

## 2018-07-01 ENCOUNTER — Telehealth: Payer: Self-pay | Admitting: Lactation Services

## 2018-07-01 ENCOUNTER — Ambulatory Visit (INDEPENDENT_AMBULATORY_CARE_PROVIDER_SITE_OTHER): Payer: Medicaid Other | Admitting: Obstetrics and Gynecology

## 2018-07-01 VITALS — BP 114/78 | HR 83 | Temp 98.1°F | Wt 252.9 lb

## 2018-07-01 DIAGNOSIS — O34219 Maternal care for unspecified type scar from previous cesarean delivery: Secondary | ICD-10-CM

## 2018-07-01 DIAGNOSIS — O0990 Supervision of high risk pregnancy, unspecified, unspecified trimester: Secondary | ICD-10-CM | POA: Insufficient documentation

## 2018-07-01 DIAGNOSIS — O0993 Supervision of high risk pregnancy, unspecified, third trimester: Secondary | ICD-10-CM

## 2018-07-01 DIAGNOSIS — O24419 Gestational diabetes mellitus in pregnancy, unspecified control: Secondary | ICD-10-CM

## 2018-07-01 NOTE — Progress Notes (Signed)
  Prenatal Visit Note Date: 07/01/2018 Clinic: [facility]  Subjective:  Mariah Weaver is a 33 y.o. G2P1001 at [redacted]w[redacted]d being seen today for ongoing prenatal care.  She is currently monitored for the following issues for this high-risk pregnancy and has CHEST PAIN; Klippel Trenaunay syndrome; History of cesarean delivery, currently pregnant; Supervision of high risk pregnancy, antepartum; Abdominal wall hernia; and Gestational diabetes mellitus (GDM) affecting pregnancy on their problem list.  Patient reports no complaints.   Contractions: Irritability. Vag. Bleeding: None.  Movement: Present. Denies leaking of fluid.   The following portions of the patient's history were reviewed and updated as appropriate: allergies, current medications, past family history, past medical history, past social history, past surgical history and problem list. Problem list updated.  Objective:   Vitals:   07/01/18 1126  BP: 114/78  Pulse: 83  Temp: 98.1 F (36.7 C)  Weight: 252 lb 14.4 oz (114.7 kg)    Fetal Status: Fetal Heart Rate (bpm): 160   Movement: Present     General:  Alert, oriented and cooperative. Patient is in no acute distress.  Skin: Skin is warm and dry. No rash noted.   Cardiovascular: Normal heart rate noted  Respiratory: Normal respiratory effort, no problems with respiration noted  Abdomen: Soft, gravid, appropriate for gestational age. Pain/Pressure: Present     Pelvic:  Cervical exam deferred        Extremities: Normal range of motion.  Edema: Trace  Mental Status: Normal mood and affect. Normal behavior. Normal judgment and thought content.   Urinalysis:      Assessment and Plan:  Pregnancy: G2P1001 at [redacted]w[redacted]d  1. Supervision of high risk pregnancy, antepartum Routine care.  - GC/Chlamydia probe amp ()not at [facility] - Culture, beta strep (group b only)  2. Gestational diabetes mellitus (GDM) affecting pregnancy At [facility] at 0830. 4/14 8/8 and  normal growth, ac.  RN to work with pharmacy to get meter approved by insurance. Consider nst/bpp next visit if still having issues getting meter.   3. History of cesarean delivery, currently pregnant Scheduled for rpt. No btl  Preterm labor symptoms and general obstetric precautions including but not limited to vaginal bleeding, contractions, leaking of fluid and fetal movement were reviewed in detail with the patient. Please refer to After Visit Summary for other counseling recommendations.  Return in about 5 days (around 07/06/2018) for rob in person.   [place] Bing, MD

## 2018-07-01 NOTE — Telephone Encounter (Signed)
Spoke with pt while in the office for OB visit.   Pt would like to BF and maybe offer some formula. Pt reports her 35 yo son would not latch and she did not make a good supply.   Reviewed supply and demand with mom. Enc mom to try Exclusive BF unless there is a medical indication to offer formula. Enc mom to offer breast prior to any formula if she chooses to give and to make sure to pump if infant is being supplemented and try to offer EBM before offering formula if available.   Mom is getting a pump from a friend and plans to get new tubing. Pt reports she has no questions/concerns at this time.   Pt informed there is IP/OP Lactation Support available and to ask for help as needed.

## 2018-07-02 ENCOUNTER — Telehealth: Payer: Self-pay

## 2018-07-02 LAB — GC/CHLAMYDIA PROBE AMP (~~LOC~~) NOT AT ARMC
Chlamydia: NEGATIVE
Neisseria Gonorrhea: NEGATIVE

## 2018-07-02 NOTE — Telephone Encounter (Signed)
-----   Message from [place] Bing, MD sent at 07/02/2018  9:56 AM EDT ----- Regarding: any success on getting her blood sugar meter? thanks

## 2018-07-02 NOTE — Telephone Encounter (Signed)
Called pt to see if she had gotten her blood sugar meter, she stated yes and that she took her 1st measure this morning and it was 87. States that she will keep a log and bring it in on 4/20 for her appointment. Pt verbalized understanding of why I was calling and ended the call.

## 2018-07-03 ENCOUNTER — Telehealth: Payer: Self-pay | Admitting: Obstetrics & Gynecology

## 2018-07-03 NOTE — Telephone Encounter (Signed)
Called the patient to inform of face to face visit and new location. The patient verbalizes understanding. No questions at this time.

## 2018-07-04 LAB — CULTURE, BETA STREP (GROUP B ONLY): Strep Gp B Culture: POSITIVE — AB

## 2018-07-06 ENCOUNTER — Other Ambulatory Visit: Payer: Self-pay

## 2018-07-06 ENCOUNTER — Ambulatory Visit (INDEPENDENT_AMBULATORY_CARE_PROVIDER_SITE_OTHER): Payer: Medicaid Other | Admitting: Family Medicine

## 2018-07-06 ENCOUNTER — Encounter: Payer: Self-pay | Admitting: Obstetrics and Gynecology

## 2018-07-06 VITALS — BP 111/66 | HR 86 | Temp 97.6°F | Wt 255.8 lb

## 2018-07-06 DIAGNOSIS — O24419 Gestational diabetes mellitus in pregnancy, unspecified control: Secondary | ICD-10-CM

## 2018-07-06 DIAGNOSIS — K439 Ventral hernia without obstruction or gangrene: Secondary | ICD-10-CM

## 2018-07-06 DIAGNOSIS — O99820 Streptococcus B carrier state complicating pregnancy: Secondary | ICD-10-CM | POA: Insufficient documentation

## 2018-07-06 DIAGNOSIS — O368130 Decreased fetal movements, third trimester, not applicable or unspecified: Secondary | ICD-10-CM

## 2018-07-06 DIAGNOSIS — O0990 Supervision of high risk pregnancy, unspecified, unspecified trimester: Secondary | ICD-10-CM

## 2018-07-06 DIAGNOSIS — O0993 Supervision of high risk pregnancy, unspecified, third trimester: Secondary | ICD-10-CM

## 2018-07-06 DIAGNOSIS — O34219 Maternal care for unspecified type scar from previous cesarean delivery: Secondary | ICD-10-CM

## 2018-07-06 DIAGNOSIS — Q872 Congenital malformation syndromes predominantly involving limbs: Secondary | ICD-10-CM

## 2018-07-06 LAB — GLUCOSE, CAPILLARY: Glucose-Capillary: 92 mg/dL (ref 70–99)

## 2018-07-06 NOTE — Progress Notes (Signed)
   PRENATAL VISIT NOTE  Subjective:  Mariah Weaver is a 33 y.o. G2P1001 at [redacted]w[redacted]d being seen today for ongoing prenatal care.  She is currently monitored for the following issues for this high-risk pregnancy and has CHEST PAIN; Klippel Trenaunay syndrome; History of cesarean delivery, currently pregnant; Supervision of high risk pregnancy, antepartum; Abdominal wall hernia; and Gestational diabetes mellitus (GDM) affecting pregnancy on their problem list.  Patient reports feeling the baby move only once a day.  Contractions: Not present. Vag. Bleeding: None.  Movement: Present. Denies leaking of fluid.   The following portions of the patient's history were reviewed and updated as appropriate: allergies, current medications, past family history, past medical history, past social history, past surgical history and problem list.   Objective:   Vitals:   07/06/18 1108  BP: 111/66  Pulse: 86  Temp: 97.6 F (36.4 C)  Weight: 255 lb 12.8 oz (116 kg)    Fetal Status: Fetal Heart Rate (bpm): 147   Movement: Present  Presentation: Vertex  General:  Alert, oriented and cooperative. Patient is in no acute distress.  Skin: Skin is warm and dry. No rash noted.   Cardiovascular: Normal heart rate noted  Respiratory: Normal respiratory effort, no problems with respiration noted  Abdomen: Soft, gravid, appropriate for gestational age.  Pain/Pressure: Present     Pelvic: Cervical exam performed Dilation: Fingertip Effacement (%): Thick Station: -3  Extremities: Normal range of motion.  Edema: Trace  Mental Status: Normal mood and affect. Normal behavior. Normal judgment and thought content.   Assessment and Plan:  Pregnancy: G2P1001 at [redacted]w[redacted]d 1. Supervision of high risk pregnancy, antepartum FHT and FH normal  2. Gestational diabetes mellitus (GDM) affecting pregnancy Just started testing 4-5 days ago. Checked fasting and before a few meals. Discussed when to check - 2hr PP.  3. History of  cesarean delivery, currently pregnant Has scheduled RLTCS  4. Klippel Trenaunay syndrome  5. Abdominal wall hernia   6. Decreased fetal movements in third trimester, single or unspecified fetus NST reactive  Preterm labor symptoms and general obstetric precautions including but not limited to vaginal bleeding, contractions, leaking of fluid and fetal movement were reviewed in detail with the patient. Please refer to After Visit Summary for other counseling recommendations.   No follow-ups on file.  Future Appointments  Date Time Provider Department Center  07/10/2018 10:00 AM [facility] PAT 1 [facility] None    Levie Heritage, DO

## 2018-07-10 ENCOUNTER — Encounter (HOSPITAL_COMMUNITY)
Admission: RE | Admit: 2018-07-10 | Discharge: 2018-07-10 | Disposition: A | Discharge status: Home / Self Care | Payer: Medicaid Other | Source: Ambulatory Visit

## 2018-07-10 HISTORY — DX: Gestational diabetes mellitus in pregnancy, unspecified control: O24.419

## 2018-07-13 ENCOUNTER — Inpatient Hospital Stay (HOSPITAL_COMMUNITY): Payer: Medicaid Other | Admitting: Anesthesiology

## 2018-07-13 ENCOUNTER — Encounter (HOSPITAL_COMMUNITY): Payer: Self-pay | Admitting: *Deleted

## 2018-07-13 ENCOUNTER — Inpatient Hospital Stay (HOSPITAL_COMMUNITY)
Admission: RE | Admit: 2018-07-13 | Discharge: 2018-07-16 | DRG: 785 | Disposition: A | Discharge status: Home / Self Care | Payer: Medicaid Other | Attending: Obstetrics and Gynecology | Admitting: Obstetrics and Gynecology

## 2018-07-13 ENCOUNTER — Encounter (HOSPITAL_COMMUNITY)
Admission: RE | Disposition: A | Discharge status: Home / Self Care | Payer: Self-pay | Source: Home / Self Care | Attending: Obstetrics and Gynecology

## 2018-07-13 ENCOUNTER — Other Ambulatory Visit: Payer: Self-pay

## 2018-07-13 DIAGNOSIS — O99824 Streptococcus B carrier state complicating childbirth: Secondary | ICD-10-CM | POA: Diagnosis present

## 2018-07-13 DIAGNOSIS — O99214 Obesity complicating childbirth: Secondary | ICD-10-CM | POA: Diagnosis present

## 2018-07-13 DIAGNOSIS — K439 Ventral hernia without obstruction or gangrene: Secondary | ICD-10-CM | POA: Diagnosis present

## 2018-07-13 DIAGNOSIS — O24419 Gestational diabetes mellitus in pregnancy, unspecified control: Secondary | ICD-10-CM | POA: Diagnosis present

## 2018-07-13 DIAGNOSIS — Z3A39 39 weeks gestation of pregnancy: Secondary | ICD-10-CM | POA: Diagnosis not present

## 2018-07-13 DIAGNOSIS — Q872 Congenital malformation syndromes predominantly involving limbs: Secondary | ICD-10-CM

## 2018-07-13 DIAGNOSIS — Z302 Encounter for sterilization: Secondary | ICD-10-CM

## 2018-07-13 DIAGNOSIS — O34211 Maternal care for low transverse scar from previous cesarean delivery: Secondary | ICD-10-CM | POA: Diagnosis present

## 2018-07-13 DIAGNOSIS — Z9189 Other specified personal risk factors, not elsewhere classified: Secondary | ICD-10-CM

## 2018-07-13 DIAGNOSIS — O34219 Maternal care for unspecified type scar from previous cesarean delivery: Secondary | ICD-10-CM

## 2018-07-13 DIAGNOSIS — O24420 Gestational diabetes mellitus in childbirth, diet controlled: Secondary | ICD-10-CM | POA: Diagnosis present

## 2018-07-13 DIAGNOSIS — O9962 Diseases of the digestive system complicating childbirth: Secondary | ICD-10-CM | POA: Diagnosis present

## 2018-07-13 DIAGNOSIS — O99820 Streptococcus B carrier state complicating pregnancy: Secondary | ICD-10-CM

## 2018-07-13 DIAGNOSIS — O902 Hematoma of obstetric wound: Secondary | ICD-10-CM | POA: Diagnosis not present

## 2018-07-13 DIAGNOSIS — Z98891 History of uterine scar from previous surgery: Secondary | ICD-10-CM

## 2018-07-13 HISTORY — PX: TUBAL LIGATION: SHX77

## 2018-07-13 LAB — CBC
HCT: 33.8 % — ABNORMAL LOW (ref 36.0–46.0)
Hemoglobin: 11.4 g/dL — ABNORMAL LOW (ref 12.0–15.0)
MCH: 34.7 pg — ABNORMAL HIGH (ref 26.0–34.0)
MCHC: 33.7 g/dL (ref 30.0–36.0)
MCV: 102.7 fL — ABNORMAL HIGH (ref 80.0–100.0)
Platelets: 221 10*3/uL (ref 150–400)
RBC: 3.29 MIL/uL — ABNORMAL LOW (ref 3.87–5.11)
RDW: 13.9 % (ref 11.5–15.5)
WBC: 6.9 10*3/uL (ref 4.0–10.5)
nRBC: 0 % (ref 0.0–0.2)

## 2018-07-13 LAB — GLUCOSE, CAPILLARY
Glucose-Capillary: 59 mg/dL — ABNORMAL LOW (ref 70–99)
Glucose-Capillary: 87 mg/dL (ref 70–99)

## 2018-07-13 LAB — CREATININE, SERUM
Creatinine, Ser: 0.59 mg/dL (ref 0.44–1.00)
GFR calc Af Amer: >60 mL/min (ref >60)
GFR calc non Af Amer: >60 mL/min (ref >60)

## 2018-07-13 SURGERY — Surgical Case
Anesthesia: General

## 2018-07-13 MED ORDER — LACTATED RINGERS IV SOLN
INTRAVENOUS | Status: DC
  Administered 2018-07-13 (×4): via INTRAVENOUS

## 2018-07-13 MED ORDER — DIPHENHYDRAMINE HCL 25 MG PO CAPS: 25.0000 mg | ORAL_CAPSULE | Freq: Four times a day (QID) | ORAL | Status: DC | PRN

## 2018-07-13 MED ORDER — TRANEXAMIC ACID-NACL 1000-0.7 MG/100ML-% IV SOLN
INTRAVENOUS | Status: AC
  Filled 2018-07-13: qty 100

## 2018-07-13 MED ORDER — ONDANSETRON HCL 4 MG/2ML IJ SOLN
INTRAMUSCULAR | Status: AC
  Filled 2018-07-13: qty 2

## 2018-07-13 MED ORDER — ACETAMINOPHEN 10 MG/ML IV SOLN
INTRAVENOUS | Status: AC
  Filled 2018-07-13: qty 100

## 2018-07-13 MED ORDER — OXYTOCIN 40 UNITS IN NORMAL SALINE INFUSION - SIMPLE MED: 2.5000 [IU]/h | INTRAVENOUS | Status: AC

## 2018-07-13 MED ORDER — ACETAMINOPHEN 10 MG/ML IV SOLN
1000.0000 mg | Freq: Once | INTRAVENOUS | Status: DC | PRN
  Administered 2018-07-13: 1000 mg via INTRAVENOUS

## 2018-07-13 MED ORDER — CEFAZOLIN SODIUM-DEXTROSE 2-4 GM/100ML-% IV SOLN
2.0000 g | INTRAVENOUS | Status: AC
  Administered 2018-07-13: 2 g via INTRAVENOUS

## 2018-07-13 MED ORDER — PROPOFOL 10 MG/ML IV BOLUS
INTRAVENOUS | Status: AC
  Filled 2018-07-13: qty 20

## 2018-07-13 MED ORDER — ROCURONIUM BROMIDE 100 MG/10ML IV SOLN
INTRAVENOUS | Status: DC | PRN
  Administered 2018-07-13: 20 mg via INTRAVENOUS

## 2018-07-13 MED ORDER — SIMETHICONE 80 MG PO CHEW
80.0000 mg | CHEWABLE_TABLET | ORAL | Status: DC
  Administered 2018-07-13 – 2018-07-15 (×3): 80 mg via ORAL
  Filled 2018-07-13 (×3): qty 1

## 2018-07-13 MED ORDER — WITCH HAZEL-GLYCERIN EX PADS: 1.0000 "application " | MEDICATED_PAD | CUTANEOUS | Status: DC | PRN

## 2018-07-13 MED ORDER — ACETAMINOPHEN 325 MG PO TABS
650.0000 mg | ORAL_TABLET | ORAL | Status: DC | PRN
  Administered 2018-07-13 – 2018-07-15 (×4): 650 mg via ORAL
  Filled 2018-07-13 (×4): qty 2

## 2018-07-13 MED ORDER — SODIUM CHLORIDE 0.9 % IR SOLN
Status: DC | PRN
  Administered 2018-07-13: 1000 mL

## 2018-07-13 MED ORDER — DEXAMETHASONE SODIUM PHOSPHATE 10 MG/ML IJ SOLN
INTRAMUSCULAR | Status: DC | PRN
  Administered 2018-07-13: 4 mg via INTRAVENOUS

## 2018-07-13 MED ORDER — ENOXAPARIN SODIUM 60 MG/0.6ML ~~LOC~~ SOLN
60.0000 mg | SUBCUTANEOUS | Status: DC
  Administered 2018-07-14 – 2018-07-16 (×3): 60 mg via SUBCUTANEOUS
  Filled 2018-07-13 (×3): qty 0.6

## 2018-07-13 MED ORDER — ONDANSETRON HCL 4 MG/2ML IJ SOLN
INTRAMUSCULAR | Status: DC | PRN
  Administered 2018-07-13: 4 mg via INTRAVENOUS

## 2018-07-13 MED ORDER — CEFAZOLIN SODIUM-DEXTROSE 2-4 GM/100ML-% IV SOLN
INTRAVENOUS | Status: AC
  Filled 2018-07-13: qty 100

## 2018-07-13 MED ORDER — SIMETHICONE 80 MG PO CHEW: 80.0000 mg | CHEWABLE_TABLET | ORAL | Status: DC | PRN

## 2018-07-13 MED ORDER — SUGAMMADEX SODIUM 200 MG/2ML IV SOLN
INTRAVENOUS | Status: DC | PRN
  Administered 2018-07-13: 231.4 mg via INTRAVENOUS

## 2018-07-13 MED ORDER — KETOROLAC TROMETHAMINE 30 MG/ML IJ SOLN
30.0000 mg | Freq: Four times a day (QID) | INTRAMUSCULAR | Status: AC
  Administered 2018-07-13 – 2018-07-14 (×3): 30 mg via INTRAVENOUS
  Filled 2018-07-13 (×3): qty 1

## 2018-07-13 MED ORDER — SUCCINYLCHOLINE CHLORIDE 20 MG/ML IJ SOLN
INTRAMUSCULAR | Status: DC | PRN
  Administered 2018-07-13: 200 mg via INTRAVENOUS

## 2018-07-13 MED ORDER — HYDROMORPHONE HCL 1 MG/ML IJ SOLN
INTRAMUSCULAR | Status: AC
  Filled 2018-07-13: qty 1

## 2018-07-13 MED ORDER — BUPIVACAINE HCL (PF) 0.5 % IJ SOLN
INTRAMUSCULAR | Status: DC | PRN
  Administered 2018-07-13: 20 mL

## 2018-07-13 MED ORDER — PROPOFOL 10 MG/ML IV BOLUS
INTRAVENOUS | Status: DC | PRN
  Administered 2018-07-13: 200 mg via INTRAVENOUS

## 2018-07-13 MED ORDER — DEXAMETHASONE SODIUM PHOSPHATE 4 MG/ML IJ SOLN
INTRAMUSCULAR | Status: AC
  Filled 2018-07-13: qty 1

## 2018-07-13 MED ORDER — OXYCODONE HCL 5 MG PO TABS
5.0000 mg | ORAL_TABLET | ORAL | Status: DC | PRN
  Administered 2018-07-14 (×2): 10 mg via ORAL
  Administered 2018-07-15 – 2018-07-16 (×4): 5 mg via ORAL
  Filled 2018-07-13: qty 2
  Filled 2018-07-13: qty 1
  Filled 2018-07-13: qty 2
  Filled 2018-07-13: qty 1
  Filled 2018-07-13: qty 2
  Filled 2018-07-13: qty 1

## 2018-07-13 MED ORDER — TRANEXAMIC ACID-NACL 1000-0.7 MG/100ML-% IV SOLN
INTRAVENOUS | Status: DC | PRN
  Administered 2018-07-13: 1000 mg via INTRAVENOUS

## 2018-07-13 MED ORDER — COCONUT OIL OIL
1.0000 "application " | TOPICAL_OIL | Status: DC | PRN
  Administered 2018-07-15: 1 via TOPICAL

## 2018-07-13 MED ORDER — BUPIVACAINE HCL (PF) 0.5 % IJ SOLN
INTRAMUSCULAR | Status: AC
  Filled 2018-07-13: qty 30

## 2018-07-13 MED ORDER — SIMETHICONE 80 MG PO CHEW
80.0000 mg | CHEWABLE_TABLET | Freq: Three times a day (TID) | ORAL | Status: DC
  Administered 2018-07-14 – 2018-07-16 (×5): 80 mg via ORAL
  Filled 2018-07-13 (×6): qty 1

## 2018-07-13 MED ORDER — ZOLPIDEM TARTRATE 5 MG PO TABS: 5.0000 mg | ORAL_TABLET | Freq: Every evening | ORAL | Status: DC | PRN

## 2018-07-13 MED ORDER — SENNOSIDES-DOCUSATE SODIUM 8.6-50 MG PO TABS
2.0000 | ORAL_TABLET | ORAL | Status: DC
  Administered 2018-07-13 – 2018-07-15 (×3): 2 via ORAL
  Filled 2018-07-13 (×3): qty 2

## 2018-07-13 MED ORDER — SODIUM CHLORIDE 0.9 % IV SOLN
INTRAVENOUS | Status: DC | PRN
  Administered 2018-07-13: 14:00:00 40 [IU] via INTRAVENOUS

## 2018-07-13 MED ORDER — IBUPROFEN 800 MG PO TABS
800.0000 mg | ORAL_TABLET | Freq: Three times a day (TID) | ORAL | Status: DC
  Administered 2018-07-14 – 2018-07-16 (×5): 800 mg via ORAL
  Filled 2018-07-13 (×6): qty 1

## 2018-07-13 MED ORDER — SOD CITRATE-CITRIC ACID 500-334 MG/5ML PO SOLN
ORAL | Status: AC
  Filled 2018-07-13: qty 15

## 2018-07-13 MED ORDER — PRENATAL MULTIVITAMIN CH
1.0000 | ORAL_TABLET | Freq: Every day | ORAL | Status: DC
  Administered 2018-07-14 – 2018-07-15 (×2): 1 via ORAL
  Filled 2018-07-13 (×3): qty 1

## 2018-07-13 MED ORDER — FENTANYL CITRATE (PF) 250 MCG/5ML IJ SOLN
INTRAMUSCULAR | Status: AC
  Filled 2018-07-13: qty 5

## 2018-07-13 MED ORDER — SOD CITRATE-CITRIC ACID 500-334 MG/5ML PO SOLN
30.0000 mL | ORAL | Status: AC
  Administered 2018-07-13: 30 mL via ORAL

## 2018-07-13 MED ORDER — LACTATED RINGERS IV SOLN
INTRAVENOUS | Status: DC
  Administered 2018-07-13 – 2018-07-14 (×3): via INTRAVENOUS

## 2018-07-13 MED ORDER — SODIUM CHLORIDE 0.9 % IV SOLN
INTRAVENOUS | Status: DC | PRN
  Administered 2018-07-13: 14:00:00 via INTRAVENOUS

## 2018-07-13 MED ORDER — HYDROMORPHONE HCL 1 MG/ML IJ SOLN
0.2500 mg | INTRAMUSCULAR | Status: DC | PRN
  Administered 2018-07-13: 0.5 mg via INTRAVENOUS
  Administered 2018-07-13: 0.25 mg via INTRAVENOUS
  Administered 2018-07-13 (×2): 0.5 mg via INTRAVENOUS

## 2018-07-13 MED ORDER — MENTHOL 3 MG MT LOZG: 1.0000 | LOZENGE | OROMUCOSAL | Status: DC | PRN

## 2018-07-13 MED ORDER — OXYTOCIN 40 UNITS IN NORMAL SALINE INFUSION - SIMPLE MED
INTRAVENOUS | Status: AC
  Filled 2018-07-13: qty 1000

## 2018-07-13 MED ORDER — DIBUCAINE (PERIANAL) 1 % EX OINT: 1.0000 "application " | TOPICAL_OINTMENT | CUTANEOUS | Status: DC | PRN

## 2018-07-13 MED ORDER — FENTANYL CITRATE (PF) 100 MCG/2ML IJ SOLN
INTRAMUSCULAR | Status: DC | PRN
  Administered 2018-07-13: 100 ug via INTRAVENOUS
  Administered 2018-07-13 (×3): 50 ug via INTRAVENOUS

## 2018-07-13 MED ORDER — SUGAMMADEX SODIUM 200 MG/2ML IV SOLN: INTRAVENOUS | Status: DC | PRN

## 2018-07-13 SURGICAL SUPPLY — 44 items
APL SKNCLS STERI-STRIP NONHPOA (GAUZE/BANDAGES/DRESSINGS) ×2
BENZOIN TINCTURE PRP APPL 2/3 (GAUZE/BANDAGES/DRESSINGS) ×2 IMPLANT
CHLORAPREP W/TINT 26ML (MISCELLANEOUS) ×4 IMPLANT
CLAMP CORD UMBIL (MISCELLANEOUS) IMPLANT
CLOSURE WOUND 1/2 X4 (GAUZE/BANDAGES/DRESSINGS) ×1
CLOSURE WOUND 1/4 X3 (GAUZE/BANDAGES/DRESSINGS) ×1
CLOTH BEACON ORANGE TIMEOUT ST (SAFETY) ×4 IMPLANT
DRSG OPSITE POSTOP 4X10 (GAUZE/BANDAGES/DRESSINGS) ×4 IMPLANT
ELECT REM PT RETURN 9FT ADLT (ELECTROSURGICAL) ×4
ELECTRODE REM PT RTRN 9FT ADLT (ELECTROSURGICAL) ×2 IMPLANT
EXTRACTOR VACUUM BELL STYLE (SUCTIONS) IMPLANT
GLOVE BIOGEL PI IND STRL 6.5 (GLOVE) ×2 IMPLANT
GLOVE BIOGEL PI IND STRL 7.0 (GLOVE) ×4 IMPLANT
GLOVE BIOGEL PI INDICATOR 6.5 (GLOVE) ×2
GLOVE BIOGEL PI INDICATOR 7.0 (GLOVE) ×4
GLOVE ORTHOPEDIC STR SZ6.5 (GLOVE) ×4 IMPLANT
GOWN STRL REUS W/TWL LRG LVL3 (GOWN DISPOSABLE) ×12 IMPLANT
HEMOSTAT SURGICEL 4X8 (HEMOSTASIS) ×4 IMPLANT
HOVERMATT SINGLE USE (MISCELLANEOUS) ×2 IMPLANT
KIT ABG SYR 3ML LUER SLIP (SYRINGE) IMPLANT
NDL HYPO 25X1 1.5 SAFETY (NEEDLE) IMPLANT
NEEDLE HYPO 22GX1.5 SAFETY (NEEDLE) ×4 IMPLANT
NEEDLE HYPO 25X1 1.5 SAFETY (NEEDLE) IMPLANT
NS IRRIG 1000ML POUR BTL (IV SOLUTION) ×4 IMPLANT
PACK C SECTION WH (CUSTOM PROCEDURE TRAY) ×4 IMPLANT
PAD ABD 8X7 1/2 STERILE (GAUZE/BANDAGES/DRESSINGS) ×2 IMPLANT
PAD OB MATERNITY 4.3X12.25 (PERSONAL CARE ITEMS) ×4 IMPLANT
PENCIL SMOKE EVAC W/HOLSTER (ELECTROSURGICAL) ×4 IMPLANT
RETRACTOR TRAXI PANNICULUS (MISCELLANEOUS) IMPLANT
SPONGE GAUZE 4X4 FOR O.R. (GAUZE/BANDAGES/DRESSINGS) ×4 IMPLANT
STRIP CLOSURE SKIN 1/2X4 (GAUZE/BANDAGES/DRESSINGS) ×1 IMPLANT
STRIP CLOSURE SKIN 1/4X3 (GAUZE/BANDAGES/DRESSINGS) ×1 IMPLANT
SUT MON AB 4-0 PS1 27 (SUTURE) ×4 IMPLANT
SUT PLAIN 2 0 (SUTURE) ×20
SUT PLAIN ABS 2-0 CT1 27XMFL (SUTURE) ×2 IMPLANT
SUT VIC AB 0 CT1 36 (SUTURE) ×12 IMPLANT
SUT VIC AB 0 CTX 36 (SUTURE) ×12
SUT VIC AB 0 CTX36XBRD ANBCTRL (SUTURE) ×2 IMPLANT
SYR CONTROL 10ML LL (SYRINGE) ×4 IMPLANT
TAPE CLOTH SURG 6X10 WHT LF (GAUZE/BANDAGES/DRESSINGS) ×2 IMPLANT
TOWEL OR 17X24 6PK STRL BLUE (TOWEL DISPOSABLE) ×4 IMPLANT
TRAXI PANNICULUS RETRACTOR (MISCELLANEOUS) ×2
TRAY FOLEY W/BAG SLVR 14FR LF (SET/KITS/TRAYS/PACK) ×4 IMPLANT
WATER STERILE IRR 1000ML POUR (IV SOLUTION) ×4 IMPLANT

## 2018-07-13 NOTE — Transfer of Care (Signed)
Immediate Anesthesia Transfer of Care Note  Patient: Mariah Weaver  Procedure(s) Performed: CESAREAN SECTION (N/A ) BILATERAL TUBAL LIGATION  Patient Location: PACU  Anesthesia Type:General  Level of Consciousness: awake, alert  and oriented  Airway & Oxygen Therapy: Patient Spontanous Breathing and Patient connected to nasal cannula oxygen  Post-op Assessment: Report given to RN, Post -op Vital signs reviewed and stable and Patient moving all extremities  Post vital signs: Reviewed and stable  Last Vitals:  Vitals Value Taken Time  BP 138/93 07/13/2018  3:36 PM  Temp    Pulse 95 07/13/2018  3:40 PM  Resp 21 07/13/2018  3:40 PM  SpO2 100 % 07/13/2018  3:40 PM  Vitals shown include unvalidated device data.  Last Pain:  Vitals:   07/13/18 1046  TempSrc: Oral  PainSc: 5       Patients Stated Pain Goal: 4 (07/13/18 1046)  Complications: No apparent anesthesia complications

## 2018-07-13 NOTE — Anesthesia Preprocedure Evaluation (Signed)
Anesthesia Evaluation  Patient identified by MRN, date of birth, ID band Patient awake    Reviewed: Allergy & Precautions, NPO status , Patient's Chart, lab work & pertinent test results  Airway Mallampati: II  TM Distance: >3 FB Neck ROM: Full    Dental no notable dental hx. (+) Poor Dentition, Dental Advisory Given   Pulmonary neg pulmonary ROS,    Pulmonary exam normal breath sounds clear to auscultation       Cardiovascular negative cardio ROS Normal cardiovascular exam Rhythm:Regular Rate:Normal     Neuro/Psych negative neurological ROS  negative psych ROS   GI/Hepatic negative GI ROS, Neg liver ROS,   Endo/Other  diabetes, Gestational, Oral Hypoglycemic AgentsMorbid obesity  Renal/GU negative Renal ROS  negative genitourinary   Musculoskeletal negative musculoskeletal ROS (+)   Abdominal   Peds negative pediatric ROS (+)  Hematology negative hematology ROS (+)   Anesthesia Other Findings Klippel Trenaunay Syndrome  Reproductive/Obstetrics (+) Pregnancy                             Anesthesia Physical Anesthesia Plan  ASA: III  Anesthesia Plan: General   Post-op Pain Management:    Induction: Intravenous, Rapid sequence and Cricoid pressure planned  PONV Risk Score and Plan: 3 and Ondansetron and Dexamethasone  Airway Management Planned: Oral ETT  Additional Equipment:   Intra-op Plan:   Post-operative Plan: Extubation in OR  Informed Consent: I have reviewed the patients History and Physical, chart, labs and discussed the procedure including the risks, benefits and alternatives for the proposed anesthesia with the patient or authorized representative who has indicated his/her understanding and acceptance.     Dental advisory given  Plan Discussed with: CRNA  Anesthesia Plan Comments: (Last imaging of lumbar spine was 2014. 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 Will plan for general anesthesia. Pt amenable to plan. )        Anesthesia Quick Evaluation

## 2018-07-13 NOTE — Anesthesia Postprocedure Evaluation (Signed)
Anesthesia Post Note  Patient: Alitzel Siever Pursifull  Procedure(s) Performed: CESAREAN SECTION (N/A ) BILATERAL TUBAL LIGATION     Patient location during evaluation: PACU Anesthesia Type: General Level of consciousness: awake and alert Pain management: pain level controlled Vital Signs Assessment: post-procedure vital signs reviewed and stable Respiratory status: spontaneous breathing, nonlabored ventilation, respiratory function stable and patient connected to nasal cannula oxygen Cardiovascular status: blood pressure returned to baseline and stable Postop Assessment: no apparent nausea or vomiting Anesthetic complications: no    Last Vitals:  Vitals:   07/13/18 1600 07/13/18 1615  BP: (!) 146/95 (!) 144/91  Pulse: 81 75  Resp: (!) 22 (!) 25  Temp:    SpO2: 100% 100%    Last Pain:  Vitals:   07/13/18 1615  TempSrc:   PainSc: 7    Pain Goal: Patients Stated Pain Goal: 4 (07/13/18 1046)  LLE Motor Response: Responds to commands (07/13/18 1545)   RLE Motor Response: Responds to commands (07/13/18 1545)       Epidural/Spinal Function Cutaneous sensation: Able to Wiggle Toes (07/13/18 1615), Patient able to flex knees: Yes (07/13/18 1615), Patient able to lift hips off bed: No (07/13/18 1615), Back pain beyond tenderness at insertion site: No (07/13/18 1615), Progressively worsening motor and/or sensory loss: No (07/13/18 1615), Bowel and/or bladder incontinence post epidural: No (07/13/18 1615)  Chelsey L Woodrum

## 2018-07-13 NOTE — H&P (Signed)
Obstetric Preoperative History and Physical  Mariah Weaver is a 33 y.o. G2P1001 with IUP at [redacted]w[redacted]d presenting for scheduled cesarean section.  No acute concerns.   Prenatal Course Source of Care: [facility] with onset of care at 12 weeks Pregnancy complications or risks: Patient Active Problem List   Diagnosis Date Noted  . H/O: C-section 07/13/2018  . GBS (group B Streptococcus carrier), +RV culture, currently pregnant 07/06/2018  . Gestational diabetes mellitus (GDM) affecting pregnancy 05/11/2018  . Abdominal wall hernia 01/10/2018  . 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 History of cesarean delivery, currently pregnant 01/07/2018  . Supervision of high risk pregnancy, antepartum 01/07/2018  . CHEST PAIN 11/18/2006   She plans to breastfeed She desires bilateral tubal ligation for postpartum contraception.   Prenatal labs and studies: ABO, Rh: --/--/O POS (04/27 1120) Antibody: NEG (04/27 1120) Rubella: 5.74 (10/23 1024) RPR: Non Reactive (02/20 0924)  HBsAg: Negative (10/23 1024)  HIV: Non Reactive (02/20 0924)  GBS:  2 hr Glucola  elevated Genetic screening normal Anatomy US normal  Prenatal Transfer Tool  Fetal Ultrasounds or other Referrals:  Referred to Materal Fetal Medicine  Maternal Substance Abuse:  No Significant Maternal Medications:  None Significant Maternal Lab Results: None  Past Medical History:  Diagnosis Date  . 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 Chronic back pain   . Ear malformation   . Gestational diabetes   . 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 Hx of varicella   . Klippel Trenaunay syndrome   . Scoliosis   . Scoliosis   . Umbilical hernia     Past Surgical History:  Procedure Laterality Date  . CESAREAN SECTION  04/07/2012   Procedure: CESAREAN SECTION;  Surgeon: Mitchel Honour, DO;  Location: [facility];  Service: Obstetrics;  Laterality: N/A;  Primary Cesarean Section Delivery Baby  Boy  @ [phone],   Apgars 9/9  . EAR  CANALOPLASTY    . LEG SURGERY    . MANDIBLE RECONSTRUCTION    . TONSILLECTOMY      OB History  Gravida Para Term Preterm AB Living  SAB TAB Ectopic Multiple Live Births          1    # Outcome Date GA Lbr Len/2nd Weight Sex Delivery Anes PTL Lv  2 Current           1 Term 04/07/12 [redacted]w[redacted]d 16:05 / 04:55 3232 g M CS-LTranv EPI  LIV     Birth Comments: c/s FTP    Social History   Socioeconomic History  . Marital status: Married    Spouse name: Not on file  . Number of children: Not on file  . Years of education: Not on file  . Highest education level: Not on file  Occupational History  . Not on file  Social Needs  . Financial resource strain: Not hard at all  . Food insecurity:    Worry: Never true    Inability: Never true  . Transportation needs:    Medical: No    Non-medical: Not on file  Tobacco Use  . Smoking status: Never Smoker  . Smokeless tobacco: Never Used  Substance and Sexual Activity  . Alcohol use: No  . Drug use: No  . Sexual activity: Yes    Birth control/protection: None  Lifestyle  . Physical activity:    Days  per week: Not on file    Minutes per session: Not on file  . Stress: Only a little  Relationships  . Social connections:    Talks on phone: Not on file    Gets together: Not on file    Attends religious service: Not on file    Active member of club or organization: Not on file    Attends meetings of clubs or organizations: Not on file    Relationship status: Not on file  Other Topics Concern  . Not on file  Social History Narrative  . Not on file    Family History  Adopted: Yes  Problem Relation Age of Onset  . Heart disease Mother   . Leukemia Father   . Non-Hodgkin's lymphoma Brother     Medications Prior to Admission  Medication Sig Dispense Refill Last Dose  . Accu-Chek FastClix Lancets MISC 1 Device by Percutaneous route 4 (four) times daily. 100 each 12 07/12/2018 at Unknown time  . acetaminophen (TYLENOL)  500 MG tablet Take 1,000 mg by mouth every 6 (six) hours as needed for mild pain, moderate pain or headache.   Past Week at Unknown time  . Prenatal Multivit-Min-Fe-FA (PRENATAL VITAMINS PO) Take 1 tablet by mouth daily.   07/12/2018 at Unknown time  . Elastic Bandages & Supports (COMFORT FIT MATERNITY SUPP LG) MISC One maternity support belt to fit client (Patient not taking: Reported on 07/01/2018) 1 each 0 Not Taking  . glucose blood (ACCU-CHEK GUIDE) test strip Use as instructed QID 100 each 12 Taking   Allergies  Allergen Reactions  . Sulfa Antibiotics Hives and Itching   Review of Systems: Negative except for what is mentioned in HPI.  Physical Exam: BP 132/71   Pulse (!) 54   Temp 97.8 F (36.6 C) (Oral)   Resp 20   Ht 5\' 4"  (1.626 m)   Wt 115.7 kg   LMP 10/09/2017 (Approximate)   SpO2 100%   BMI 43.77 kg/m  FHR by Doppler: 150 bpm CONSTITUTIONAL: Well-developed, well-nourished female in no acute distress.  HENT:  Normocephalic, atraumatic, External right and left ear normal. Oropharynx is clear and moist EYES: Conjunctivae and EOM are normal. Pupils are equal, round, and reactive to light. No scleral icterus.  NECK: Normal range of motion, supple, no masses SKIN: Skin is warm and dry. No rash noted. Not diaphoretic. No erythema. No pallor. NEUROLGIC: Alert and oriented to person, place, and time. Normal reflexes, muscle tone coordination. No cranial nerve deficit noted. PSYCHIATRIC: Normal mood and affect. Normal behavior. Normal judgment and thought content. CARDIOVASCULAR: Normal heart rate noted, regular rhythm RESPIRATORY: Effort and breath sounds normal, no problems with respiration noted ABDOMEN: Soft, nontender, nondistended, gravid. Well-healed Pfannenstiel incision. PELVIC: Deferred MUSCULOSKELETAL: Normal range of motion. No edema and no tenderness. 2+ distal pulses.   Pertinent Labs/Studies:   Results for orders placed or performed during the hospital encounter  of 07/13/18 (from the past 72 hour(s))  Glucose, capillary     Status: Abnormal   Collection Time: 07/13/18 11:01 AM  Result Value Ref Range   Glucose-Capillary 59 (L) 70 - 99 mg/dL   Comment 1 Notify RN   CBC     Status: Abnormal   Collection Time: 07/13/18 11:20 AM  Result Value Ref Range   WBC 6.9 4.0 - 10.5 K/uL   RBC 3.29 (L) 3.87 - 5.11 MIL/uL   Hemoglobin 11.4 (L) 12.0 - 15.0 g/dL   HCT 40.933.8 (L) 81.136.0 - 91.446.0 %  MCV 102.7 (H) 80.0 - 100.0 fL   MCH 34.7 (H) 26.0 - 34.0 pg   MCHC 33.7 30.0 - 36.0 g/dL   RDW 02.7 25.3 - 66.4 %   Platelets 221 150 - 400 K/uL   nRBC 0.0 0.0 - 0.2 %    Comment: Performed at [facility], [address] [place], [place] [address]  Type and screen     Status: None   Collection Time: 07/13/18 11:20 AM  Result Value Ref Range   ABO/RH(D) O POS    Antibody Screen NEG    Sample Expiration      07/16/2018 Performed at [facility], [address], [place], [place] [address]     Assessment and Plan :Mariah Weaver is a 33 y.o. G2P1001 at [redacted]w[redacted]d being admitted for scheduled repeat cesarean section. She voices desire for BTL today, had extended conversation as patient has previously not voiced desire for this at her prenatal visits. She and her husband both state they have been thinking about this for a while, are completely certain they do not want any more children, they understand this is permanent procedure and cannot be reversed. No acute issues today.  The risks of cesarean section were discussed with the patient; including but not limited to: infection which may require antibiotics; bleeding which may require transfusion or re-operation; injury to bowel, bladder, ureters or other surrounding organs; need for additional procedures including hysterectomy in the event of a life-threatening hemorrhage; placental abnormalities wth subsequent pregnancies, risk of needing c-sections in future pregnancies, incisional problems, thromboembolic  phenomenon and other postoperative/anesthesia complications. Reviewed risks of bilateral tubal ligation including infection, hemorrhage, damage to surrounding tissue and organs, risk of regret. Reviewed bilateral tubal ligation failure rate of ~1%, and that there is a slightly higher risk of ectopic after tubal ligation; if she has any reason to believe she is pregnant, she should take a pregnancy test. She understands this is an elective procedure and again affirms her desire. Answered all questions. The patient verbalized understanding of the plan, giving informed consent for the procedure. She is agreeable to blood transfusion in the event of emergency.  RN Welford Roche present for conversation and counseling regarding permanency of tubal ligation.  Patient will have general anesthesia as she does not have recent MRI to assess for progression of her Klippel-Trenaunay. I offered to have MRI done today and reschedule c-section for tomorrow/Wednesday and she declines, would like to proceed today.   For RCS+BTL Patient has been NPO since 8 pm, she will remain NPO for procedure Anesthesia and OR aware Preoperative prophylactic antibiotics and SCDs ordered on call to the OR  To OR when ready   K. Therese Sarah, M.D. Attending Obstetrician & Gynecologist, [facility] for [employer], [facility]

## 2018-07-13 NOTE — Op Note (Addendum)
Mariah NajjarAlicia V Gaudin PROCEDURE DATE: 07/13/2018  PREOPERATIVE DIAGNOSES: Intrauterine pregnancy at [redacted]w[redacted]d weeks gestation; history of prior Cesarean delivery  POSTOPERATIVE DIAGNOSES: The same  PROCEDURE: Repeat Low Transverse Cesarean Section, bilateral tubal ligation  SURGEON:  Baldemar LenisK. Meryl Davis, MD  ASSISTANT:  Rhett BannisterLaurel Wallace, DO  ANESTHESIOLOGY TEAM: Anesthesiologist: Elmer PickerWoodrum, Chelsey L, MD CRNA: Elgie CongoMalinova, Nataliya H, CRNA  INDICATIONS: Mariah Najjarlicia V Alred is a 33 y.o. [id number] at 591w0d here for cesarean section secondary to the indications listed under preoperative diagnoses; please see preoperative note for further details.  The risks of cesarean section were discussed with the patient including but were not limited to: bleeding which may require transfusion or reoperation; infection which may require antibiotics; injury to bowel, bladder, ureters or other surrounding organs; injury to the fetus; need for additional procedures including hysterectomy in the event of a life-threatening hemorrhage; placental abnormalities wth subsequent pregnancies, incisional problems, thromboembolic phenomenon and other postoperative/anesthesia complications.  She consents to blood transfusion in the event of an emergency. The patient verbalized understanding of the plan, giving informed written consent for the procedure.    FINDINGS:  Viable female infant in cephalic presentation.  Apgars per delivery summary, infant vigorous at birth. Clear amniotic fluid.  Intact placenta, three vessel cord.  Normal uterus, fallopian tubes and ovaries bilaterally though with increased edema. Right broad ligament hematoma.  ANESTHESIA:   INTRAVENOUS FLUIDS: 2700 ml   ESTIMATED BLOOD LOSS: 683 ml URINE OUTPUT:  100 ml SPECIMENS: Placenta sent to pathology COMPLICATIONS: Right hysterotomy hematoma - stable and not increasing in size after placement of several figure-of-eight sutures.   PROCEDURE IN DETAIL:  The patient preoperatively  received intravenous antibiotics and had sequential compression devices applied to her lower extremities.  She was then taken to the operating room where she was then placed in a dorsal supine position with a leftward tilt, and prepped and draped in a sterile manner.  A foley catheter was placed into her bladder with sterile technique and attached to constant gravity.  A timeout was performed. General anesthesia was induced and as soon as she was under general anesthesia, a Pfannenstiel skin incision was made with scalpel over her preexisting scar and carried through to the underlying layer of fascia. The fascia was incised in the midline, and this incision was extended bilaterally using the Mayo scissors. The rectus muscles were separated in the midline bluntly and the peritoneum was entered bluntly. The peritoneal incision was carefully extended sharply laterally and caudad with good visualization of the bladder. The uterus appeared normal. The Alexis O-ring retractor was placed into the incision, taking care not to incorporate bowel or omentum. Attention was turned to the lower uterine segment where a low transverse hysterotomy was made with a scalpel and extended bilaterally bluntly.  The infant was delivered from vertex position, and the cord clamped immediately. The infant was then handed over to the waiting neonatology team. Uterine massage was then performed, and the placenta delivered intact with a three-vessel cord. The uterus was then cleared of clots and debris.  The hysterotomy was closed in two layers with 0 Vicryl in a running locked fashion, and an imbricating layer was also placed with 0 Vicryl. Figure-of-eight 0 Vicryl serosal stitches were placed to help with hemostasis.   The fallopian tubes and ovaries were visualized bilaterally and normal appearing.The left Fallopian tube was identified, grasped with the Babcock clamps. An avascular midsection of the tube approximately 3-4cm from the cornua  was grasped with the babcock clamps and  brought into a knuckle. The tube was double ligated with one 0 plain gut suture and the intervening portion of tube was transected and removed.  Attention was then turned to the right fallopian tube after confirmation of identification by tracing the tube out to the fimbriae. The same procedure was then performed on the right Fallopian tube, with excellent hemostasis noted at both BTL sites at the end of the procedure. The Alexis retractor was removed.  The pelvis was cleared of all clot and debris. A slowly expanding hematoma was noted on the right side of the hysterotomy within the broad ligament. Several figure-of-eight stitches were placed with 0 Vicryl to help achieve hemostasis and the hematoma was noted to remain small and not continue to expand in size. Her urine was noted to be clear at this point. Arista was placed over the hysterotomy for additional hemostasis.  Hemostasis was again confirmed on all surfaces. The peritoneum was re-approximated using 2-0 Vicryl suture. The fascia was then closed using looped 0 PDS in a running fashion.  The subcutaneous layer was irrigated, then reapproximated with 2-0 plain gut, and 30 ml of 0.5% Marcaine was injected subcutaneously around the incision.  The skin was closed with a 4-0 Monocryl subcuticular stitch. The patient tolerated the procedure well. Sponge, lap, instrument and needle counts were correct x 3.  She was taken to the recovery room in stable condition.   Cristal Deer. Earlene Plater, DO OB/GYN Fellow

## 2018-07-13 NOTE — Anesthesia Procedure Notes (Signed)
Procedure Name: Intubation Date/Time: 07/13/2018 1:47 PM Performed by: Elgie Congo, CRNA Pre-anesthesia Checklist: Patient identified, Emergency Drugs available, Suction available and Patient being monitored Patient Re-evaluated:Patient Re-evaluated prior to induction Oxygen Delivery Method: Circle system utilized Preoxygenation: Pre-oxygenation with 100% oxygen Induction Type: IV induction, Rapid sequence and Cricoid Pressure applied Laryngoscope Size: Glidescope Grade View: Grade I Tube type: Oral Tube size: 7.0 mm Number of attempts: 1 Airway Equipment and Method: Rigid stylet Placement Confirmation: ETT inserted through vocal cords under direct vision,  positive ETCO2 and breath sounds checked- equal and bilateral Secured at: 22 cm Tube secured with: Tape Dental Injury: Teeth and Oropharynx as per pre-operative assessment

## 2018-07-13 NOTE — Discharge Summary (Signed)
Obstetrics Discharge Summary OB/GYN Faculty Practice   Patient Name: Mariah Weaver DOB: 1986-02-11 MRN: [id number]  Date of admission: 07/13/2018 Delivering MD: Conan BowensAVIS, KELLY M   Date of discharge: 07/16/2018  Admitting diagnosis: pregnacy Intrauterine pregnancy: 595w0d     Secondary diagnosis:   Principal Problem:   History of cesarean delivery, currently pregnant Active Problems:   Klippel Trenaunay syndrome   Abdominal wall hernia   Gestational diabetes mellitus (GDM) affecting pregnancy   GBS (group B Streptococcus carrier), +RV culture, currently pregnant   H/O: C-section   Cesarean delivery delivered    Discharge diagnosis: Term Pregnancy Delivered and GDM A1                                            Postpartum procedures: None  Complications: none  Outpatient Follow-Up: [ ]  2-hr GTT [ ]  incision check   Hospital course: Mariah Najjarlicia V Griffiths is a 33 y.o. 695w0d who was admitted for scheduled repeat LTCS. Her pregnancy was complicated by above noted. Delivery was complicated by need for general anesthesia because of history of Klippel Trenaunay Syndrome without recent MRI. Please see delivery/op note for additional details. Her postpartum course was uncomplicated. She was breastfeeding without difficulty. By day of discharge, she was passing flatus, urinating, eating and drinking without difficulty. Her pain was well-controlled, and she was discharged home with Oxy IR and Motrin. She will follow-up in clinic in 1 week for incision check, then 6 weeks for PP visit and GTT.   Physical exam  Vitals:   07/15/18 0538 07/15/18 1359 07/15/18 2151 07/16/18 0533  BP: 136/79 114/78 120/62 114/72  Pulse: 72 64 74 61  Resp: 16 16 18 16   Temp: (!) 97.5 F (36.4 C) 98.8 F (37.1 C) 98.6 F (37 C) 97.7 F (36.5 C)  TempSrc: Axillary Oral Axillary Oral  SpO2: 98%     Weight:      Height:       General: alert, cooperative Lochia: appropriate Uterine Fundus: firm Incision: honeycomb  intact, dry DVT Evaluation: No evidence of DVT seen on physical exam. Labs: Lab Results  Component Value Date   WBC 7.2 07/14/2018   HGB 9.5 (L) 07/14/2018   HCT 28.7 (L) 07/14/2018   MCV 103.6 (H) 07/14/2018   PLT 201 07/14/2018   CMP Latest Ref Rng & Units 07/13/2018  Glucose 65 - 99 mg/dL -  BUN 6 - 20 mg/dL -  Creatinine 4.090.44 - 8.111.00 mg/dL 9.140.59  Sodium 782135 - 956145 mmol/L -  Potassium 3.5 - 5.1 mmol/L -  Chloride 101 - 111 mmol/L -  CO2 22 - 32 mmol/L -  Calcium 8.9 - 10.3 mg/dL -  Total Protein 6.0 - 8.3 g/dL -  Total Bilirubin 0.3 - 1.2 mg/dL -  Alkaline Phos 39 - 213117 units/L -  AST 0 - 37 units/L -  ALT 0 - 40 units/L -    Discharge instructions: Per After Visit Summary and "Baby and Me Booklet"  After visit meds:  Allergies as of 07/16/2018      Reactions   Sulfa Antibiotics Hives, Itching      Medication List    STOP taking these medications   acetaminophen 500 MG tablet Commonly known as:  TYLENOL     TAKE these medications   ibuprofen 800 MG tablet Commonly known as:  ADVIL Take 1 tablet (800 mg  total) by mouth every 8 (eight) hours as needed.   oxyCODONE 5 MG immediate release tablet Commonly known as:  Oxy IR/ROXICODONE Take 1 tablet (5 mg total) by mouth every 4 (four) hours as needed for moderate pain.   PRENATAL VITAMINS PO Take 1 tablet by mouth daily.       Postpartum contraception: Tubal Ligation Diet: Routine Diet Activity: Advance as tolerated. Pelvic rest for 6 weeks.   Follow-up Appt: Future Appointments  Date Time Provider Department Center  07/28/2018  1:30 PM [facility] NURSE [facility]  08/25/2018  8:20 AM [facility] LAB [facility]   Follow-up Visit:No follow-ups on file.  Please schedule this patient for Postpartum visit in: 4 weeks with the following provider: Any provider For C/S patients schedule nurse incision check in weeks 2 weeks: yes High risk pregnancy complicated by: A1GDM, Klippel Trenauney Syndrome, hx prior  C/S Delivery mode:  CS Anticipated Birth Control:  BTL done PP PP Procedures needed: incision check, 2-hr GTT  Schedule Integrated BH visit: no  Newborn Data: Live born female  Birth Weight: 6 lb 13.7 oz (3110 g) APGAR: (not listed in delivery summary)   Newborn Delivery   Birth date/time:  07/13/2018 13:51:00 Delivery type:  C-Section, Low Transverse Trial of labor:  No C-section categorization:  Repeat     Baby Feeding: Breast Disposition:home with mother  Arabella Merles [facility] 07/16/2018 8:48 AM

## 2018-07-14 LAB — CBC
HCT: 28.7 % — ABNORMAL LOW (ref 36.0–46.0)
Hemoglobin: 9.5 g/dL — ABNORMAL LOW (ref 12.0–15.0)
MCH: 34.3 pg — ABNORMAL HIGH (ref 26.0–34.0)
MCHC: 33.1 g/dL (ref 30.0–36.0)
MCV: 103.6 fL — ABNORMAL HIGH (ref 80.0–100.0)
Platelets: 201 10*3/uL (ref 150–400)
RBC: 2.77 MIL/uL — ABNORMAL LOW (ref 3.87–5.11)
RDW: 13.8 % (ref 11.5–15.5)
WBC: 7.2 10*3/uL (ref 4.0–10.5)
nRBC: 0 % (ref 0.0–0.2)

## 2018-07-14 LAB — TYPE AND SCREEN
ABO/RH(D): O POS
Antibody Screen: NEGATIVE

## 2018-07-14 LAB — ABO/RH: ABO/RH(D): O POS

## 2018-07-14 LAB — RPR: RPR Ser Ql: NONREACTIVE

## 2018-07-14 LAB — GLUCOSE, CAPILLARY: Glucose-Capillary: 89 mg/dL (ref 70–99)

## 2018-07-14 MED ORDER — LACTATED RINGERS IV BOLUS
500.0000 mL | Freq: Once | INTRAVENOUS | Status: AC
  Administered 2018-07-14: 500 mL via INTRAVENOUS

## 2018-07-14 NOTE — Lactation Note (Signed)
This note was copied from a baby's chart. Lactation Consultation Note Baby seen at 2200. Late entry. Baby sleepy at breast. Mom stated baby hasn't been very hungry. Has spit up a couple of times. Mom states she tried to BF her son but it didn't work out. Mom stated she didn't have enough milk for him. LC made several suggestion in latching, hand expression and feeding. Mom appears to want to do things her way. Mom is going to do breast and formula until her milk comes in. Mom mentioned pacifier, LC discouraged until after 2 weeks. Mom has everted nipples. Hand expression w/colostrum noted. Newborn behavior, STS, I&O, supply and demand discussed. Baby swaddled in blankets wearing clothes in football position.  Baby not interested in BF d/t spitting at this time. Encouraged to call for assistance or questions. Lactation and support group brochure at bedside.  Patient Name: Mariah Weaver Today's Date: 07/14/2018     Maternal Data    Feeding Feeding Type: Breast Fed Nipple Type: Slow - flow  LATCH Score                   Interventions    Lactation Tools Discussed/Used     Consult Status      Jerone Cudmore G 07/14/2018, 6:54 AM

## 2018-07-14 NOTE — Progress Notes (Signed)
Faculty Attending Note  Post Op Day 1  Subjective: Patient is feeling sore. She reports moderately well controlled pain on PO pain meds. She is ambulating a small amount and denies light-headedness or dizziness. She has foley catheter in place for poor urine output overnight. She has not had a BM yet. She is tolerating a regular diet without nausea/vomiting. Bleeding is moderate. She is breast feeding. Baby is in room and doing well.  Objective: Blood pressure 121/74, pulse 66, temperature 98.2 F (36.8 C), temperature source Oral, resp. rate 18, height 5\' 4"  (1.626 m), weight 115.7 kg, last menstrual period 10/09/2017, SpO2 99 %, unknown if currently breastfeeding. Temp:  [97.7 F (36.5 C)-98.8 F (37.1 C)] 98.2 F (36.8 C) (04/28 0516) Pulse Rate:  [54-95] 66 (04/28 0516) Resp:  [16-25] 18 (04/28 0516) BP: (119-146)/(67-95) 121/74 (04/28 0516) SpO2:  [97 %-100 %] 99 % (04/28 0516) Weight:  [115.7 kg] 115.7 kg (04/27 1046)  Physical Exam:  General: alert, oriented, cooperative Chest: normal respiratory effort Heart: RRR  Abdomen: soft, appropriately tender to palpation, incision covered by dressing with no evidence of active bleeding  Uterine Fundus: firm, 2 fingers below the umbilicus Lochia: moderate, rubra DVT Evaluation: no evidence of DVT Extremities: no edema, no calf tenderness  UOP: < 25 mL/hr clear yellow urine last several hours  Recent Labs    07/13/18 1120 07/14/18 0505  HGB 11.4* 9.5*  HCT 33.8* 28.7*    Assessment/Plan: Patient Active Problem List   Diagnosis Date Noted  . H/O: C-section 07/13/2018  . Cesarean delivery delivered 07/13/2018  . GBS (group B Streptococcus carrier), +RV culture, currently pregnant 07/06/2018  . Gestational diabetes mellitus (GDM) affecting pregnancy 05/11/2018  . Abdominal wall hernia 01/10/2018  . 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 History of cesarean delivery, currently pregnant 01/07/2018  . Supervision of high  risk pregnancy, antepartum 01/07/2018  . CHEST PAIN 11/18/2006    Patient is 33 y.o. [id number] POD#1 s/p eRCS+BTL at [redacted]w[redacted]d. Course complicated by gDMA1, Klippel-Trenaunay. She is doing very well, recovering appropriately and complains only of soreness. Will remove foley when urine output picks up again.     Continue routine post partum care Pain meds prn Regular diet Lovenox 40 mg daily [place] S/p BTL for birth control Plan for discharge likely tomorrow    K. Therese Sarah, M.D. Attending Center for [employer] (Faculty Practice)  07/14/2018, 8:06 AM

## 2018-07-14 NOTE — Lactation Note (Signed)
This note was copied from a baby's chart. Lactation Consultation Note  Patient Name: Mariah Weaver Today's Date: 07/14/2018 Reason for consult: Follow-up assessment;Term;Infant weight loss(5% weight loss )  Baby is 25 hours old  Baby awake and rooting / RN examined baby 1st and then LC assisted mom to latch  On the right breast / football/ swallows noted and increased with breast compressions.  baby still feeding at 10 mins with depth - Latch score 8. It took assistance and several attempts to latch  Baby and sustain latch. Per mom comfortable.  Mom has some areola edema with erect nipple/ LC recommended prior to latch - breast massage,  Hand express, reverse pressure so baby can obtain depth easier.  Mom has a bra, but its a sports bra and snug with her.  Mom would benefit from shells between feedings except when sleeping.   Maternal Data Has patient been taught Hand Expression?: Yes  Feeding Feeding Type: Breast Fed  LATCH Score Latch: Repeated attempts needed to sustain latch, nipple held in mouth throughout feeding, stimulation needed to elicit sucking reflex.  Audible Swallowing: Spontaneous and intermittent  Type of Nipple: Everted at rest and after stimulation  Comfort (Breast/Nipple): Soft / non-tender  Hold (Positioning): Assistance needed to correctly position infant at breast and maintain latch.  LATCH Score: 8  Interventions Interventions: Breast feeding basics reviewed;Assisted with latch;Skin to skin;Breast massage;Breast compression;Adjust position;Support pillows;Position options;Expressed milk  Lactation Tools Discussed/Used     Consult Status Consult Status: Follow-up Date: 07/15/18 Follow-up type: In-patient    Matilde Sprang Keyleigh Manninen 07/14/2018, 3:34 PM

## 2018-07-15 ENCOUNTER — Encounter (HOSPITAL_COMMUNITY): Payer: Self-pay

## 2018-07-15 NOTE — Progress Notes (Signed)
Subjective: [name] reports incisional pain, tolerating PO and no problems voiding.   Does not want to go home today.  States she needs more time   Objective: Vital signs in last 24 hours: Temp:  [97.5 F (36.4 C)-98.8 F (37.1 C)] 97.5 F (36.4 C) (04/29 0538) Pulse Rate:  [67-85] 72 (04/29 0538) Resp:  [15-18] 16 (04/29 0538) BP: (109-136)/(56-79) 136/79 (04/29 0538) SpO2:  [98 %-100 %] 98 % (04/29 0538)  Physical Exam:  General: alert, cooperative and no distress Lochia: appropriate Uterine Fundus: firm Incision: Pressure dressing clean and intact DVT Evaluation: No evidence of DVT seen on physical exam.  Recent Labs    07/13/18 1120 07/14/18 0505  HGB 11.4* 9.5*  HCT 33.8* 28.7*    Assessment/Plan: Status post Cesarean section. Doing well postoperatively.  Continue Care Plan Discharge home tomorrow.  Mariah Weaver 07/15/2018, 6:26 AM

## 2018-07-16 ENCOUNTER — Encounter (HOSPITAL_COMMUNITY): Payer: Self-pay | Admitting: Obstetrics and Gynecology

## 2018-07-16 MED ORDER — IBUPROFEN 800 MG PO TABS: 800.0000 mg | ORAL_TABLET | Freq: Three times a day (TID) | ORAL | 0 refills | Status: DC | PRN

## 2018-07-16 MED ORDER — OXYCODONE HCL 5 MG PO TABS: 5.0000 mg | ORAL_TABLET | ORAL | 0 refills | Status: DC | PRN

## 2018-07-16 NOTE — Lactation Note (Signed)
This note was copied from a baby's chart. Lactation Consultation Note  Patient Name: Mariah Weaver Today's Date: 07/16/2018 Reason for consult: Follow-up assessment Baby 67 hours old/7% weight loss.  Mom called out for feeding assist.  Baby is in football hold but more pillow support needed.  Adjusted positioning and added support.  After a few attempts baby latched well.  Good swallows heard.  Baby needing simulation to stay active.  Discussed milk coming to volume and the prevention and treatment of engorgement.  Mom has a manual pump for prn use.  She pumped with DEBP last night and obtained 30 mls.  Baby fed expressed milk with a slow flow nipple.  Mom continues to formula feed as supplement.  She denies questions or concerns.  Reviewed lactation outpatient services and support.  Encouraged to call prn.  Maternal Data    Feeding Feeding Type: Breast Fed  LATCH Score Latch: Grasps breast easily, tongue down, lips flanged, rhythmical sucking.  Audible Swallowing: Spontaneous and intermittent  Type of Nipple: Everted at rest and after stimulation  Comfort (Breast/Nipple): Soft / non-tender  Hold (Positioning): Assistance needed to correctly position infant at breast and maintain latch.  LATCH Score: 9  Interventions Interventions: Assisted with latch;Breast compression;Adjust position;Breast massage;Support pillows  Lactation Tools Discussed/Used     Consult Status Consult Status: Complete Follow-up type: Call as needed    Huston Foley 07/16/2018, 9:14 AM

## 2018-07-16 NOTE — Discharge Instructions (Signed)

## 2018-07-23 ENCOUNTER — Other Ambulatory Visit: Payer: Self-pay | Admitting: General Surgery

## 2018-07-23 ENCOUNTER — Telehealth (INDEPENDENT_AMBULATORY_CARE_PROVIDER_SITE_OTHER): Payer: Medicaid Other

## 2018-07-23 DIAGNOSIS — K436 Other and unspecified ventral hernia with obstruction, without gangrene: Secondary | ICD-10-CM

## 2018-07-23 NOTE — Telephone Encounter (Signed)
Pt called stating that she just had a c-section and is out of oxy however she is still having pain.  I informed pt that usually at about a week out she should be using the oxy less and allow ibuprofen to be her main pain management.  I explained to the patient to make sure that she is moving around and that we can evaluate her pain at her incision check on 07/28/18.  If her intensifies to please go to MAU.  Pt stated understanding.

## 2018-07-27 ENCOUNTER — Ambulatory Visit
Admission: RE | Admit: 2018-07-27 | Discharge: 2018-07-27 | Disposition: A | Discharge status: Home / Self Care | Payer: Medicaid Other | Source: Ambulatory Visit | Attending: General Surgery | Admitting: General Surgery

## 2018-07-27 DIAGNOSIS — K436 Other and unspecified ventral hernia with obstruction, without gangrene: Secondary | ICD-10-CM

## 2018-07-27 MED ORDER — IOPAMIDOL (ISOVUE-300) INJECTION 61%
100.0000 mL | Freq: Once | INTRAVENOUS | Status: AC | PRN
  Administered 2018-07-27: 100 mL via INTRAVENOUS

## 2018-07-28 ENCOUNTER — Ambulatory Visit (INDEPENDENT_AMBULATORY_CARE_PROVIDER_SITE_OTHER): Payer: Medicaid Other | Admitting: *Deleted

## 2018-07-28 ENCOUNTER — Other Ambulatory Visit: Payer: Self-pay

## 2018-07-28 VITALS — BP 136/62 | HR 66 | Ht 64.5 in | Wt 234.3 lb

## 2018-07-28 DIAGNOSIS — Z5189 Encounter for other specified aftercare: Secondary | ICD-10-CM

## 2018-07-28 DIAGNOSIS — Z9889 Other specified postprocedural states: Secondary | ICD-10-CM

## 2018-07-28 NOTE — Progress Notes (Signed)
Pt presents for incision check.  Pt states she still has some mild pain when she first gets up and moves.  Pt denies any s/s of infection.  Incision is clean, dry, and well approximated.  Steri strips removed during visit.  Pt educated on s/s of infection and wound care.  Pt verbalized understanding.  Pt states she has occasional headaches but has not been checking her blood pressure at home.  Pt denies vision changes.  Pt states she is able to check her blood pressures at home and will do that if she has any more headaches.  Advised pt of s/s of when to call for elevated BP as well as that she should contact the clinic if her BP is >140/90.  Pt verbalized understanding.

## 2018-07-29 NOTE — Progress Notes (Signed)
-  Patient needs two hour PP glucola; will message Front Desk staff to schedule.  Chart reviewed for nurse visit. Agree with plan of care.   Marylene Land, CNM 07/29/2018 12:38 PM

## 2018-07-30 ENCOUNTER — Other Ambulatory Visit: Payer: Self-pay | Admitting: General Surgery

## 2018-08-21 ENCOUNTER — Other Ambulatory Visit: Payer: Self-pay | Admitting: *Deleted

## 2018-08-21 ENCOUNTER — Telehealth: Payer: Self-pay | Admitting: Obstetrics & Gynecology

## 2018-08-21 DIAGNOSIS — O24439 Gestational diabetes mellitus in the puerperium, unspecified control: Secondary | ICD-10-CM

## 2018-08-21 NOTE — Telephone Encounter (Signed)
Called the patient to confirm upcoming visit. Left a detailed voicemail message of how to access the visit.

## 2018-08-24 ENCOUNTER — Telehealth (INDEPENDENT_AMBULATORY_CARE_PROVIDER_SITE_OTHER): Payer: Medicaid Other

## 2018-08-24 ENCOUNTER — Other Ambulatory Visit: Payer: Self-pay

## 2018-08-24 DIAGNOSIS — Z1389 Encounter for screening for other disorder: Secondary | ICD-10-CM

## 2018-08-24 NOTE — Progress Notes (Signed)
TELEHEALTH VIRTUAL POSTPARTUM VISIT ENCOUNTER NOTE  I connected with@ on 08/24/18 at  1:15 PM EDT by MyChart Virtual Visit at home and verified that I am speaking with the correct person using two identifiers.   I discussed the limitations, risks, security and privacy concerns of performing an evaluation and management service by telephone and the availability of in person appointments. I also discussed with the patient that there may be a patient responsible charge related to this service. The patient expressed understanding and agreed to proceed.  Appointment Date: 08/24/2018  [facility]  Chief Complaint: No chief complaint on file.   History of Present Illness: Mariah Weaver is a 33 y.o. Caucasian G2P2002 (Patient's last menstrual period was 10/09/2017 (approximate).), seen for the above chief complaint. Her past medical history is significant for n/a   She is s/p repeat cesarean section on 4/27 at 39 weeks; she was discharged to home on PPD#3. Pregnancy complicated by A1GDM. Baby is doing well.  Complains of n/a  Vaginal bleeding or discharge: No  Mode of feeding infant: Bottle Intercourse: No  Contraception: bilateral tubal ligation PP depression s/s: No .  Any bowel or bladder issues: No  Pap smear: no abnormalities (date: 03/2015)  Review of Systems: Positive for n/a. Her 12 point review of systems is negative or as noted in the History of Present Illness.  Patient Active Problem List   Diagnosis Date Noted  . H/O: C-section 07/13/2018  . Cesarean delivery delivered 07/13/2018  . GBS (group B Streptococcus carrier), +RV culture, currently pregnant 07/06/2018  . Gestational diabetes mellitus (GDM) affecting pregnancy 05/11/2018  . Abdominal wall hernia 01/10/2018  . 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 History of cesarean delivery, currently pregnant 01/07/2018  . Supervision of high risk pregnancy, antepartum 01/07/2018  . CHEST PAIN 11/18/2006     Medications Mariah Weaver had no medications administered during this visit. Current Outpatient Medications  Medication Sig Dispense Refill  . ibuprofen (ADVIL) 800 MG tablet Take 1 tablet (800 mg total) by mouth every 8 (eight) hours as needed. 30 tablet 0  . oxyCODONE (OXY IR/ROXICODONE) 5 MG immediate release tablet Take 1 tablet (5 mg total) by mouth every 4 (four) hours as needed for moderate pain. 30 tablet 0  . Prenatal Multivit-Min-Fe-FA (PRENATAL VITAMINS PO) Take 1 tablet by mouth daily.     No current facility-administered medications for this visit.     Allergies Sulfa antibiotics  Physical Exam:  General:  Alert, oriented and cooperative.   Mental Status: Normal mood and affect perceived. Normal judgment and thought content.  Rest of physical exam deferred due to type of encounter  PP Depression Screening:   Edinburgh Postnatal Depression Scale - 08/24/18 1318      Edinburgh Postnatal Depression Scale:  In the Past 7 Days   I have been able to laugh and see the funny side of things.  0    I have looked forward with enjoyment to things.  0    I have blamed myself unnecessarily when things went wrong.  0    I have been anxious or worried for no good reason.  0    I have felt scared or panicky for no good reason.  0    Things have been getting on top of me.  0    I have been so unhappy that I have had difficulty sleeping.  0    I have felt sad or miserable.  0  I have been so unhappy that I have been crying.  0    The thought of harming myself has occurred to me.  0    Edinburgh Postnatal Depression Scale Total  0       Assessment:Patient is a 33 y.o. [id number] who is 6 weeks postpartum from a repeat cesarean section.  She is doing well.   Plan: 1. Postpartum care and examination    -Patient doing well postpartum -Will come for 2hr GTT lab only appointment tomorrow  RTC in 1 year or sooner if needed  I discussed the assessment and treatment plan with  the patient. The patient was provided an opportunity to ask questions and all were answered. The patient agreed with the plan and demonstrated an understanding of the instructions.   The patient was advised to call back or seek an in-person evaluation/go to the ED for any concerning postpartum symptoms.  I provided 25 minutes of non-face-to-face time during this encounter.   Wende Mott, [place] for [employer], [place]

## 2018-08-25 ENCOUNTER — Other Ambulatory Visit: Payer: Medicaid Other

## 2018-09-29 NOTE — Pre-Procedure Instructions (Addendum)
Mariah Weaver  [birth year]      [id number] [place], [place] AT [facility] [place] [address] [place] [address] Phone: [phone] Fax: [phone]    Your procedure is scheduled on Fri., October 09, 2018 from 7:30AM-9:30AM  Report to [facility] Entrance "A" at 5:30AM  Call this number if you have problems the morning of surgery:  [phone]   Remember:  Do not eat after midnight on July 23rd  You may drink clear liquids until 3 hours (4:30AM) prior to surgery time.  Clear liquids allowed are:  Water, Juice (non-citric and without pulp), Carbonated beverages, Clear Tea, Black Coffee only, Plain Jell-O only, Gatorade and Plain Popsicles only    Take these medicines the morning of surgery with A SIP OF WATER: If needed: Acetaminophen (TYLENOL)  7 days before surgery (10/02/18), stop taking all Aspirin (unless instructed by your doctor) and Other Aspirin containing products, Vitamins, Fish oils, and Herbal medications. Also stop all NSAIDS i.e. Advil, Ibuprofen, Motrin, Aleve, Anaprox, Naproxen, BC, Goody Powders, and all Supplements.   Center- Preparing For Surgery  Before surgery, you can play an important role. Because skin is not sterile, your skin needs to be as free of germs as possible. You can reduce the number of germs on your skin by washing with CHG (chlorahexidine gluconate) Soap before surgery.  CHG is an antiseptic cleaner which kills germs and bonds with the skin to continue killing germs even after washing.    Please do not use if you have an allergy to CHG or antibacterial soaps. If your skin becomes reddened/irritated stop using the CHG.  Do not shave (including legs and underarms) for at least 48 hours prior to first CHG shower. It is OK to shave your face.  Please follow these instructions carefully.   1. Shower the NIGHT BEFORE SURGERY and the MORNING OF SURGERY with CHG.   2. If  you chose to wash your hair, wash your hair first as usual with your normal shampoo.  3. After you shampoo, rinse your hair and body thoroughly to remove the shampoo.  4. Use CHG as you would any other liquid soap. You can apply CHG directly to the skin and wash gently with a scrungie or a clean washcloth.   5. Apply the CHG Soap to your body ONLY FROM THE NECK DOWN.  Do not use on open wounds or open sores. Avoid contact with your eyes, ears, mouth and genitals (private parts). Wash Face and genitals (private parts)  with your normal soap.  6. Wash thoroughly, paying special attention to the area where your surgery will be performed.  7. Thoroughly rinse your body with warm water from the neck down.  8. DO NOT shower/wash with your normal soap after using and rinsing off the CHG Soap.  9. Pat yourself dry with a CLEAN TOWEL.  10. Wear CLEAN PAJAMAS to bed the night before surgery, wear comfortable clothes the morning of surgery  11. Place CLEAN SHEETS on your bed the night of your first shower and DO NOT SLEEP WITH PETS.   Day of Surgery:             Remember to brush your teeth WITH YOUR REGULAR TOOTHPASTE.     Do not wear jewelry, make-up or nail polish.  Do not wear lotions, powders, or perfumes, or deodorant.  Do not shave 48 hours prior to surgery.  Do not bring valuables to the hospital.  [facility] is not responsible for any belongings or valuables.  Contacts, dentures or bridgework may not be worn into surgery.   For patients admitted to the hospital, discharge time will be determined by your treatment team.  Patients discharged the day of surgery will not be allowed to drive home.   Please wear clean clothes to the hospital/surgery center.    Please read over the following fact sheets that you were given. Pain Booklet, Coughing and Deep Breathing and Surgical Site Infection Prevention

## 2018-09-30 ENCOUNTER — Other Ambulatory Visit: Payer: Self-pay

## 2018-09-30 ENCOUNTER — Encounter (HOSPITAL_COMMUNITY): Payer: Self-pay

## 2018-09-30 ENCOUNTER — Encounter (HOSPITAL_COMMUNITY)
Admission: RE | Admit: 2018-09-30 | Discharge: 2018-09-30 | Disposition: A | Discharge status: Home / Self Care | Payer: Medicaid Other | Source: Ambulatory Visit | Attending: General Surgery | Admitting: General Surgery

## 2018-09-30 DIAGNOSIS — Z01812 Encounter for preprocedural laboratory examination: Secondary | ICD-10-CM | POA: Insufficient documentation

## 2018-09-30 HISTORY — DX: Unspecified osteoarthritis, unspecified site: M19.90

## 2018-09-30 LAB — COMPREHENSIVE METABOLIC PANEL
ALT: 30 U/L (ref 0–44)
AST: 23 U/L (ref 15–41)
Albumin: 3.4 g/dL — ABNORMAL LOW (ref 3.5–5.0)
Alkaline Phosphatase: 76 U/L (ref 38–126)
Anion gap: 8 (ref 5–15)
BUN: 8 mg/dL (ref 6–20)
CO2: 24 mmol/L (ref 22–32)
Calcium: 9 mg/dL (ref 8.9–10.3)
Chloride: 106 mmol/L (ref 98–111)
Creatinine, Ser: 0.71 mg/dL (ref 0.44–1.00)
GFR calc Af Amer: >60 mL/min (ref >60)
GFR calc non Af Amer: >60 mL/min (ref >60)
Glucose, Bld: 112 mg/dL — ABNORMAL HIGH (ref 70–99)
Potassium: 3.8 mmol/L (ref 3.5–5.1)
Sodium: 138 mmol/L (ref 135–145)
Total Bilirubin: 1.5 mg/dL — ABNORMAL HIGH (ref 0.3–1.2)
Total Protein: 6.9 g/dL (ref 6.5–8.1)

## 2018-09-30 LAB — CBC WITH DIFFERENTIAL/PLATELET
Abs Immature Granulocytes: 0.02 10*3/uL (ref 0.00–0.07)
Basophils Absolute: 0.1 10*3/uL (ref 0.0–0.1)
Basophils Relative: 1 %
Eosinophils Absolute: 0.2 10*3/uL (ref 0.0–0.5)
Eosinophils Relative: 3 %
HCT: 43.2 % (ref 36.0–46.0)
Hemoglobin: 14 g/dL (ref 12.0–15.0)
Immature Granulocytes: 0 %
Lymphocytes Relative: 29 %
Lymphs Abs: 1.7 10*3/uL (ref 0.7–4.0)
MCH: 32.4 pg (ref 26.0–34.0)
MCHC: 32.4 g/dL (ref 30.0–36.0)
MCV: 100 fL (ref 80.0–100.0)
Monocytes Absolute: 0.5 10*3/uL (ref 0.1–1.0)
Monocytes Relative: 8 %
Neutro Abs: 3.4 10*3/uL (ref 1.7–7.7)
Neutrophils Relative %: 59 %
Platelets: 268 10*3/uL (ref 150–400)
RBC: 4.32 MIL/uL (ref 3.87–5.11)
RDW: 13.1 % (ref 11.5–15.5)
WBC: 5.9 10*3/uL (ref 4.0–10.5)
nRBC: 0 % (ref 0.0–0.2)

## 2018-09-30 NOTE — Progress Notes (Signed)
Patient informed of the [employer] that is currently in effect.  Patient verbalized understanding.  Patient denies shortness of breath, fever, cough and chest pain at PAT appointment.  Patient does not have any cardiac history.  PCP - No PCP - Would go to an [facility]   Anesthesia review: No  STOP now taking any Aspirin (unless otherwise instructed by your surgeon), Aleve, Naproxen, Ibuprofen, Motrin, Advil, Goody's, BC's, all herbal medications, fish oil, and all vitamins.  Coronavirus Screening Have you or a family member experienced the following symptoms:  Cough yes/no: No Fever (>100.10F)  yes/no: No Runny nose yes/no: No Sore throat yes/no: No Difficulty breathing/shortness of breath  yes/no: No  Have you or a family member traveled in the last 14 days and where? yes/no: No

## 2018-10-01 NOTE — H&P (Signed)
Mariah Weaver Location: [facility] Patient #: [id number] DOB: 11-20-1985 Married / Language: English / Race: White Female       History of Present Illness       . This is a 33 year old female who returns with her mother to discuss repair of her moderately large epigastric hernia. Louretta Shorten is her gynecologist.     I last saw her on December 15, 2017 when she was [redacted] weeks pregnant and exam revealed a midline hernia just above the umbilicus the size of a grapefruit. Felt like omentum was in the hernia and I could reduce it mostly. She made it through her pregnancy. Delivered a term infant by C-section 2 weeks ago and everything is going well. She went ahead with the CT scan which showed a hernia sac about 10 cm in size with the transverse colon present but the neck of the sac was no more than 5 cm. No signs of obstruction or inflammation She is motivated to have the hernia repaired in July after she recovers from her C-section. She is breast-feeding and pumping.     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 Social history she is married. Now with 2 children. Had her tubes tied last about or alcohol. 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 Family history reveals heart disease in mother and father. Mother is with her throughout the encounter today      The hernia was completely reducible today. The defect is more easily defined and feels like about 4 cm at most. I think we can do this laparoscopically, do a hybrid procedure and close the fascia and do underlay mesh. This may have to be converted to open and she knows that.      She'll be scheduled for laparoscopic-assisted repair of epigastric hernia with underlay mesh, possible open repair. I discussed the indications,  details, techniques, numerous risk of the surgery with her. She and her mother are aware of the risk of bleeding, infection, recurrence of the hernia, nerve damage with chronic pain, injury to adjacent organs such as the intestine requiring major reconstructive surgery, cardiac pulmonary and thromboembolic problems. I drew pictures of the repair and gave her a patient information booklet. She understands all these issues. All questions are answered. She agrees with this plan.    Allergies  Sulfa Antibiotics  Allergies Reconciled   Medication History  Ibuprofen (800MG  Tablet, Oral) Active. Medications Reconciled  Vitals  Weight: 231 lb Height: 64.5in Body Surface Area: 2.09 m Body Mass Index: 39.04 kg/m  Temp.: 97.62F  Pulse: 63 (Regular)  BP: 130/80(Sitting, Left Arm, Standard)       Physical Exam  General Mental Status-Alert. General Appearance-Not in acute distress. Build & Nutrition-Well nourished. Posture-Normal posture. Gait-Normal.  Head and Neck Head-normocephalic, atraumatic with no lesions or palpable masses. Trachea-midline. Thyroid Gland Characteristics - normal size and consistency and no palpable nodules.  Chest and Lung Exam Chest and lung exam reveals -on auscultation, normal breath sounds, no adventitious sounds and normal vocal resonance.  Cardiovascular Cardiovascular examination reveals -normal heart sounds, regular rate and rhythm with no murmurs and femoral artery auscultation bilaterally reveals normal pulses, no bruits, no thrills.  Abdomen Inspection Inspection of the abdomen reveals - Note: Large midline supraumbilical hernia 10 cm sac at least. When supine I couldn't completely reduce this and the defect felt about 4  cm and better defined. Palpation/Percussion Palpation and Percussion of the abdomen reveal - Soft, Non Tender, No Rigidity (guarding), No hepatosplenomegaly and No Palpable abdominal  masses. Note: Pfannenstiel incision looks like it is healing well. No inguinal hernia   Neurologic Neurologic evaluation reveals -alert and oriented x 3 with no impairment of recent or remote memory, normal attention span and ability to concentrate, normal sensation and normal coordination.  Musculoskeletal Normal Exam - Bilateral-Upper Extremity Strength Normal and Lower Extremity Strength Normal.    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 The muscle defect is about 4-5 cm with the hernia sac is much larger and contains the transverse colon In the office today I was able to completely push everything back in so there is no immediate danger  You request that we proceed with repair of this hernia in July and at that is reasonable unless you develop progressive symptoms My plan is to do a laparoscopic assisted repair of your hernia with inlay mesh It is possible that this might have to be converted to an open operation but hopefully not you will need to stay in the hospital at least one night I discussed the indications, techniques, and risk of the surgery in detail with you and your mother  I would like for you to lose weight. 1-2 pounds a week if possible. 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 Derrell LollingIngram, M.D., [facility] General and Minimally invasive Surgery Breast and Colorectal Surgery Office:   [phone] Pager:   [phone]

## 2018-10-06 ENCOUNTER — Other Ambulatory Visit (HOSPITAL_COMMUNITY)
Admission: RE | Admit: 2018-10-06 | Discharge: 2018-10-06 | Disposition: A | Discharge status: Home / Self Care | Payer: Medicaid Other | Source: Ambulatory Visit | Attending: General Surgery | Admitting: General Surgery

## 2018-10-06 DIAGNOSIS — Z1159 Encounter for screening for other viral diseases: Secondary | ICD-10-CM | POA: Insufficient documentation

## 2018-10-06 LAB — SARS CORONAVIRUS 2 (TAT 6-24 HRS): SARS Coronavirus 2: NEGATIVE

## 2018-10-08 MED ORDER — DEXTROSE 5 % IV SOLN
3.0000 g | INTRAVENOUS | Status: AC
  Administered 2018-10-09: 08:00:00 3 g via INTRAVENOUS
  Filled 2018-10-08: qty 3
  Filled 2018-10-08: qty 3000

## 2018-10-08 MED ORDER — BUPIVACAINE LIPOSOME 1.3 % IJ SUSP
20.0000 mL | Freq: Once | INTRAMUSCULAR | Status: DC
  Filled 2018-10-08: qty 20

## 2018-10-08 NOTE — Anesthesia Preprocedure Evaluation (Addendum)
Anesthesia Evaluation  Patient identified by MRN, date of birth, ID band Patient awake    Reviewed: Allergy & Precautions, NPO status , Patient's Chart, lab work & pertinent test results  History of Anesthesia Complications Negative for: history of anesthetic complications  Airway Mallampati: III  TM Distance: >3 FB Neck ROM: Full    Dental  (+) Dental Advisory Given, Chipped,    Pulmonary neg pulmonary ROS,    breath sounds clear to auscultation       Cardiovascular negative cardio ROS   Rhythm:Regular Rate:Normal     Neuro/Psych  Hearing loss negative psych ROS   GI/Hepatic negative GI ROS, Neg liver ROS,   Endo/Other  Morbid obesity  Renal/GU negative Renal ROS     Musculoskeletal  (+) Arthritis ,  Scoliosis    Abdominal   Peds  Hematology negative hematology ROS (+)   Anesthesia Other Findings Klippel Trenaunay syndrome  Reproductive/Obstetrics  Hx tubal ligation                             Anesthesia Physical Anesthesia Plan  ASA: III  Anesthesia Plan: General   Post-op Pain Management:    Induction: Intravenous  PONV Risk Score and Plan: 4 or greater and Treatment may vary due to age or medical condition, Ondansetron, Scopolamine patch - Pre-op, Midazolam and Dexamethasone  Airway Management Planned: Oral ETT and Video Laryngoscope Planned  Additional Equipment: None  Intra-op Plan:   Post-operative Plan: Extubation in OR  Informed Consent: I have reviewed the patients History and Physical, chart, labs and discussed the procedure including the risks, benefits and alternatives for the proposed anesthesia with the patient or authorized representative who has indicated his/her understanding and acceptance.     Dental advisory given  Plan Discussed with: CRNA and Anesthesiologist  Anesthesia Plan Comments:        Anesthesia Quick Evaluation

## 2018-10-09 ENCOUNTER — Ambulatory Visit (HOSPITAL_COMMUNITY)
Admission: RE | Admit: 2018-10-09 | Discharge: 2018-10-10 | Disposition: A | Discharge status: Home / Self Care | Payer: Medicaid Other | Attending: General Surgery | Admitting: General Surgery

## 2018-10-09 ENCOUNTER — Ambulatory Visit (HOSPITAL_COMMUNITY): Payer: Medicaid Other | Admitting: Anesthesiology

## 2018-10-09 ENCOUNTER — Encounter (HOSPITAL_COMMUNITY)
Admission: RE | Disposition: A | Discharge status: Home / Self Care | Payer: Self-pay | Source: Home / Self Care | Attending: General Surgery

## 2018-10-09 ENCOUNTER — Encounter (HOSPITAL_COMMUNITY): Payer: Self-pay

## 2018-10-09 DIAGNOSIS — K439 Ventral hernia without obstruction or gangrene: Secondary | ICD-10-CM | POA: Diagnosis present

## 2018-10-09 DIAGNOSIS — K429 Umbilical hernia without obstruction or gangrene: Secondary | ICD-10-CM | POA: Insufficient documentation

## 2018-10-09 DIAGNOSIS — Z6841 Body Mass Index (BMI) 40.0 and over, adult: Secondary | ICD-10-CM | POA: Insufficient documentation

## 2018-10-09 HISTORY — PX: VENTRAL HERNIA REPAIR: SHX424

## 2018-10-09 LAB — CBC
HCT: 44.7 % (ref 36.0–46.0)
Hemoglobin: 14.8 g/dL (ref 12.0–15.0)
MCH: 32.5 pg (ref 26.0–34.0)
MCHC: 33.1 g/dL (ref 30.0–36.0)
MCV: 98 fL (ref 80.0–100.0)
Platelets: 233 10*3/uL (ref 150–400)
RBC: 4.56 MIL/uL (ref 3.87–5.11)
RDW: 12.7 % (ref 11.5–15.5)
WBC: 11.7 10*3/uL — ABNORMAL HIGH (ref 4.0–10.5)
nRBC: 0 % (ref 0.0–0.2)

## 2018-10-09 LAB — CREATININE, SERUM
Creatinine, Ser: 0.9 mg/dL (ref 0.44–1.00)
GFR calc Af Amer: >60 mL/min (ref >60)
GFR calc non Af Amer: >60 mL/min (ref >60)

## 2018-10-09 LAB — POCT PREGNANCY, URINE: Preg Test, Ur: NEGATIVE

## 2018-10-09 SURGERY — REPAIR, HERNIA, VENTRAL, LAPAROSCOPIC
Anesthesia: General | Site: Abdomen

## 2018-10-09 MED ORDER — HYDROCODONE-ACETAMINOPHEN 5-325 MG PO TABS: 1.0000 | ORAL_TABLET | Freq: Four times a day (QID) | ORAL | 0 refills | Status: DC | PRN

## 2018-10-09 MED ORDER — OXYCODONE HCL 5 MG/5ML PO SOLN: 5.0000 mg | Freq: Once | ORAL | Status: AC | PRN

## 2018-10-09 MED ORDER — GABAPENTIN 300 MG PO CAPS
300.0000 mg | ORAL_CAPSULE | ORAL | Status: AC
  Administered 2018-10-09: 300 mg via ORAL
  Filled 2018-10-09: qty 1

## 2018-10-09 MED ORDER — CHLORHEXIDINE GLUCONATE CLOTH 2 % EX PADS
6.0000 | MEDICATED_PAD | Freq: Once | CUTANEOUS | Status: DC
  Administered 2018-10-09: 6 via TOPICAL

## 2018-10-09 MED ORDER — ENOXAPARIN SODIUM 40 MG/0.4ML ~~LOC~~ SOLN
40.0000 mg | SUBCUTANEOUS | Status: DC
  Administered 2018-10-10: 40 mg via SUBCUTANEOUS
  Filled 2018-10-09: qty 0.4

## 2018-10-09 MED ORDER — BUPIVACAINE HCL (PF) 0.25 % IJ SOLN
INTRAMUSCULAR | Status: AC
  Filled 2018-10-09: qty 30

## 2018-10-09 MED ORDER — STERILE WATER FOR IRRIGATION IR SOLN
Status: DC | PRN
  Administered 2018-10-09: 1000 mL

## 2018-10-09 MED ORDER — TRAMADOL HCL 50 MG PO TABS: 50.0000 mg | ORAL_TABLET | Freq: Four times a day (QID) | ORAL | Status: DC | PRN

## 2018-10-09 MED ORDER — LACTATED RINGERS IV SOLN
INTRAVENOUS | Status: DC | PRN
  Administered 2018-10-09 (×2): via INTRAVENOUS

## 2018-10-09 MED ORDER — BUPIVACAINE-EPINEPHRINE 0.25% -1:200000 IJ SOLN
INTRAMUSCULAR | Status: DC | PRN
  Administered 2018-10-09: 24 mL

## 2018-10-09 MED ORDER — PROPOFOL 10 MG/ML IV BOLUS
INTRAVENOUS | Status: AC
  Filled 2018-10-09: qty 20

## 2018-10-09 MED ORDER — DEXAMETHASONE SODIUM PHOSPHATE 10 MG/ML IJ SOLN
INTRAMUSCULAR | Status: DC | PRN
  Administered 2018-10-09: 10 mg via INTRAVENOUS

## 2018-10-09 MED ORDER — SCOPOLAMINE 1 MG/3DAYS TD PT72
MEDICATED_PATCH | TRANSDERMAL | Status: AC
  Filled 2018-10-09: qty 1

## 2018-10-09 MED ORDER — SENNA 8.6 MG PO TABS
1.0000 | ORAL_TABLET | Freq: Two times a day (BID) | ORAL | Status: DC
  Administered 2018-10-09 – 2018-10-10 (×2): 8.6 mg via ORAL
  Filled 2018-10-09 (×2): qty 1

## 2018-10-09 MED ORDER — HYDROMORPHONE HCL 1 MG/ML IJ SOLN
1.0000 mg | INTRAMUSCULAR | Status: DC | PRN
  Administered 2018-10-09 – 2018-10-10 (×2): 1 mg via INTRAVENOUS
  Filled 2018-10-09 (×2): qty 1

## 2018-10-09 MED ORDER — MIDAZOLAM HCL 2 MG/2ML IJ SOLN
INTRAMUSCULAR | Status: AC
  Filled 2018-10-09: qty 2

## 2018-10-09 MED ORDER — LIDOCAINE 2% (20 MG/ML) 5 ML SYRINGE
INTRAMUSCULAR | Status: DC | PRN
  Administered 2018-10-09: 80 mg via INTRAVENOUS

## 2018-10-09 MED ORDER — ONDANSETRON HCL 4 MG/2ML IJ SOLN
INTRAMUSCULAR | Status: DC | PRN
  Administered 2018-10-09: 4 mg via INTRAVENOUS

## 2018-10-09 MED ORDER — HYDROCODONE-ACETAMINOPHEN 5-325 MG PO TABS
1.0000 | ORAL_TABLET | ORAL | Status: DC | PRN
  Administered 2018-10-09 (×2): 2 via ORAL
  Administered 2018-10-10 (×2): 1 via ORAL
  Filled 2018-10-09 (×2): qty 2
  Filled 2018-10-09: qty 1
  Filled 2018-10-09: qty 2

## 2018-10-09 MED ORDER — BUPIVACAINE-EPINEPHRINE (PF) 0.25% -1:200000 IJ SOLN
INTRAMUSCULAR | Status: AC
  Filled 2018-10-09: qty 30

## 2018-10-09 MED ORDER — ACETAMINOPHEN 500 MG PO TABS
1000.0000 mg | ORAL_TABLET | ORAL | Status: AC
  Administered 2018-10-09: 1000 mg via ORAL
  Filled 2018-10-09: qty 2

## 2018-10-09 MED ORDER — PROPOFOL 10 MG/ML IV BOLUS
INTRAVENOUS | Status: DC | PRN
  Administered 2018-10-09: 200 mg via INTRAVENOUS

## 2018-10-09 MED ORDER — FENTANYL CITRATE (PF) 100 MCG/2ML IJ SOLN
25.0000 ug | INTRAMUSCULAR | Status: DC | PRN
  Administered 2018-10-09: 25 ug via INTRAVENOUS

## 2018-10-09 MED ORDER — LACTATED RINGERS IV SOLN
INTRAVENOUS | Status: DC
  Administered 2018-10-09: 11:00:00 via INTRAVENOUS

## 2018-10-09 MED ORDER — 0.9 % SODIUM CHLORIDE (POUR BTL) OPTIME
TOPICAL | Status: DC | PRN
  Administered 2018-10-09: 07:00:00 1000 mL

## 2018-10-09 MED ORDER — MIDAZOLAM HCL 2 MG/2ML IJ SOLN
INTRAMUSCULAR | Status: DC | PRN
  Administered 2018-10-09: 2 mg via INTRAVENOUS

## 2018-10-09 MED ORDER — FENTANYL CITRATE (PF) 250 MCG/5ML IJ SOLN
INTRAMUSCULAR | Status: AC
  Filled 2018-10-09: qty 5

## 2018-10-09 MED ORDER — PROMETHAZINE HCL 25 MG/ML IJ SOLN: 6.2500 mg | INTRAMUSCULAR | Status: DC | PRN

## 2018-10-09 MED ORDER — CEFAZOLIN SODIUM-DEXTROSE 2-4 GM/100ML-% IV SOLN
2.0000 g | Freq: Three times a day (TID) | INTRAVENOUS | Status: AC
  Administered 2018-10-09: 2 g via INTRAVENOUS
  Filled 2018-10-09 (×2): qty 100

## 2018-10-09 MED ORDER — OXYCODONE HCL 5 MG PO TABS
ORAL_TABLET | ORAL | Status: AC
  Filled 2018-10-09: qty 1

## 2018-10-09 MED ORDER — GABAPENTIN 300 MG PO CAPS
300.0000 mg | ORAL_CAPSULE | Freq: Two times a day (BID) | ORAL | Status: DC
  Administered 2018-10-09 – 2018-10-10 (×3): 300 mg via ORAL
  Filled 2018-10-09 (×3): qty 1

## 2018-10-09 MED ORDER — CELECOXIB 200 MG PO CAPS
200.0000 mg | ORAL_CAPSULE | ORAL | Status: AC
  Administered 2018-10-09: 200 mg via ORAL
  Filled 2018-10-09: qty 1

## 2018-10-09 MED ORDER — ONDANSETRON HCL 4 MG/2ML IJ SOLN: 4.0000 mg | Freq: Four times a day (QID) | INTRAMUSCULAR | Status: DC | PRN

## 2018-10-09 MED ORDER — OXYCODONE HCL 5 MG PO TABS
5.0000 mg | ORAL_TABLET | Freq: Once | ORAL | Status: AC | PRN
  Administered 2018-10-09: 5 mg via ORAL

## 2018-10-09 MED ORDER — SUGAMMADEX SODIUM 200 MG/2ML IV SOLN: INTRAVENOUS | Status: DC | PRN

## 2018-10-09 MED ORDER — FENTANYL CITRATE (PF) 100 MCG/2ML IJ SOLN
INTRAMUSCULAR | Status: AC
  Filled 2018-10-09: qty 2

## 2018-10-09 MED ORDER — FENTANYL CITRATE (PF) 250 MCG/5ML IJ SOLN
INTRAMUSCULAR | Status: DC | PRN
  Administered 2018-10-09 (×2): 50 ug via INTRAVENOUS
  Administered 2018-10-09: 100 ug via INTRAVENOUS

## 2018-10-09 MED ORDER — ONDANSETRON 4 MG PO TBDP: 4.0000 mg | ORAL_TABLET | Freq: Four times a day (QID) | ORAL | Status: DC | PRN

## 2018-10-09 MED ORDER — SCOPOLAMINE 1 MG/3DAYS TD PT72
MEDICATED_PATCH | TRANSDERMAL | Status: DC | PRN
  Administered 2018-10-09: 1 via TRANSDERMAL

## 2018-10-09 MED ORDER — EPHEDRINE SULFATE 50 MG/ML IJ SOLN
INTRAMUSCULAR | Status: DC | PRN
  Administered 2018-10-09 (×3): 5 mg via INTRAVENOUS

## 2018-10-09 MED ORDER — CELECOXIB 200 MG PO CAPS
200.0000 mg | ORAL_CAPSULE | Freq: Two times a day (BID) | ORAL | Status: DC
  Administered 2018-10-09: 200 mg via ORAL
  Filled 2018-10-09 (×2): qty 1

## 2018-10-09 MED ORDER — SODIUM CHLORIDE 0.9 % IV SOLN
INTRAVENOUS | Status: DC | PRN
  Administered 2018-10-09: 40 mL

## 2018-10-09 MED ORDER — SUGAMMADEX SODIUM 200 MG/2ML IV SOLN
INTRAVENOUS | Status: DC | PRN
  Administered 2018-10-09: 250 mg via INTRAVENOUS
  Administered 2018-10-09: 100 mg via INTRAVENOUS

## 2018-10-09 MED ORDER — METHOCARBAMOL 500 MG PO TABS
500.0000 mg | ORAL_TABLET | Freq: Four times a day (QID) | ORAL | Status: DC | PRN
  Administered 2018-10-09 – 2018-10-10 (×2): 500 mg via ORAL
  Filled 2018-10-09 (×2): qty 1

## 2018-10-09 MED ORDER — ROCURONIUM BROMIDE 10 MG/ML (PF) SYRINGE
PREFILLED_SYRINGE | INTRAVENOUS | Status: DC | PRN
  Administered 2018-10-09 (×3): 20 mg via INTRAVENOUS
  Administered 2018-10-09: 60 mg via INTRAVENOUS

## 2018-10-09 SURGICAL SUPPLY — 54 items
ADH SKN CLS APL DERMABOND .7 (GAUZE/BANDAGES/DRESSINGS) ×2
APL PRP STRL LF DISP 70% ISPRP (MISCELLANEOUS) ×1
BINDER ABDOMINAL 12 ML 46-62 (SOFTGOODS) ×2 IMPLANT
BLADE SURG 11 STRL SS (BLADE) ×2 IMPLANT
BNDG GAUZE ELAST 4 BULKY (GAUZE/BANDAGES/DRESSINGS) ×2 IMPLANT
CANISTER SUCT 3000ML PPV (MISCELLANEOUS) ×2 IMPLANT
CHLORAPREP W/TINT 26 (MISCELLANEOUS) ×3 IMPLANT
COVER SURGICAL LIGHT HANDLE (MISCELLANEOUS) ×3 IMPLANT
DERMABOND ADVANCED (GAUZE/BANDAGES/DRESSINGS) ×4
DERMABOND ADVANCED .7 DNX12 (GAUZE/BANDAGES/DRESSINGS) ×1 IMPLANT
DEVICE SECURE STRAP 25 ABSORB (INSTRUMENTS) ×9 IMPLANT
DEVICE TROCAR PUNCTURE CLOSURE (ENDOMECHANICALS) ×3 IMPLANT
DRSG PAD ABDOMINAL 8X10 ST (GAUZE/BANDAGES/DRESSINGS) ×6 IMPLANT
ELECT REM PT RETURN 9FT ADLT (ELECTROSURGICAL) ×3
ELECTRODE REM PT RTRN 9FT ADLT (ELECTROSURGICAL) ×1 IMPLANT
GLOVE BIOGEL PI IND STRL 6.5 (GLOVE) IMPLANT
GLOVE BIOGEL PI IND STRL 7.5 (GLOVE) IMPLANT
GLOVE BIOGEL PI INDICATOR 6.5 (GLOVE) ×2
GLOVE BIOGEL PI INDICATOR 7.5 (GLOVE) ×2
GLOVE ECLIPSE 7.0 STRL STRAW (GLOVE) ×2 IMPLANT
GLOVE EUDERMIC 7 POWDERFREE (GLOVE) ×5 IMPLANT
GLOVE SURG SS PI 6.0 STRL IVOR (GLOVE) ×6 IMPLANT
GOWN STRL REUS W/ TWL LRG LVL3 (GOWN DISPOSABLE) ×2 IMPLANT
GOWN STRL REUS W/ TWL XL LVL3 (GOWN DISPOSABLE) ×1 IMPLANT
GOWN STRL REUS W/TWL LRG LVL3 (GOWN DISPOSABLE) ×6
GOWN STRL REUS W/TWL XL LVL3 (GOWN DISPOSABLE) ×6
KIT BASIN OR (CUSTOM PROCEDURE TRAY) ×3 IMPLANT
KIT TURNOVER KIT B (KITS) ×3 IMPLANT
MARKER SKIN DUAL TIP RULER LAB (MISCELLANEOUS) ×3 IMPLANT
MESH VENTRALIGHT ST 8X10 (Mesh General) ×2 IMPLANT
NDL INSUFFLATION 14GA 120MM (NEEDLE) IMPLANT
NDL SPNL 22GX3.5 QUINCKE BK (NEEDLE) ×1 IMPLANT
NEEDLE 22X1 1/2 (OR ONLY) (NEEDLE) ×2 IMPLANT
NEEDLE INSUFFLATION 14GA 120MM (NEEDLE) ×3 IMPLANT
NEEDLE SPNL 22GX3.5 QUINCKE BK (NEEDLE) ×3 IMPLANT
NS IRRIG 1000ML POUR BTL (IV SOLUTION) ×3 IMPLANT
PAD ARMBOARD 7.5X6 YLW CONV (MISCELLANEOUS) ×6 IMPLANT
PENCIL SMOKE EVACUATOR (MISCELLANEOUS) ×2 IMPLANT
SET TUBE SMOKE EVAC HIGH FLOW (TUBING) ×3 IMPLANT
SHEARS HARMONIC ACE PLUS 36CM (ENDOMECHANICALS) ×2 IMPLANT
SLEEVE ENDOPATH XCEL 5M (ENDOMECHANICALS) ×8 IMPLANT
SUT MNCRL AB 4-0 PS2 18 (SUTURE) ×5 IMPLANT
SUT NOVA NAB DX-16 0-1 5-0 T12 (SUTURE) ×7 IMPLANT
SUT VIC AB 3-0 SH 18 (SUTURE) ×2 IMPLANT
SUT VIC AB 3-0 SH 27 (SUTURE) ×3
SUT VIC AB 3-0 SH 27XBRD (SUTURE) IMPLANT
TOWEL GREEN STERILE FF (TOWEL DISPOSABLE) ×5 IMPLANT
TRAY LAPAROSCOPIC MC (CUSTOM PROCEDURE TRAY) ×3 IMPLANT
TROCAR XCEL NON-BLD 11X100MML (ENDOMECHANICALS) ×2 IMPLANT
TROCAR XCEL NON-BLD 5MMX100MML (ENDOMECHANICALS) ×3 IMPLANT
TUBE CONNECTING 12'X1/4 (SUCTIONS) ×1
TUBE CONNECTING 12X1/4 (SUCTIONS) ×1 IMPLANT
WATER STERILE IRR 1000ML POUR (IV SOLUTION) ×3 IMPLANT
YANKAUER SUCT BULB TIP NO VENT (SUCTIONS) ×2 IMPLANT

## 2018-10-09 NOTE — Transfer of Care (Signed)
Immediate Anesthesia Transfer of Care Note  Patient: Mariah Weaver  Procedure(s) Performed: LAPAROSCOPIC ASSISTED REPAIR OF EPIGASTRIC HERNIA WITH  MESH (N/A Abdomen)  Patient Location: PACU  Anesthesia Type:General  Level of Consciousness: drowsy  Airway & Oxygen Therapy: Patient Spontanous Breathing and Patient connected to face mask oxygen  Post-op Assessment: Report given to RN and Post -op Vital signs reviewed and stable  Post vital signs: Reviewed and stable  Last Vitals:  Vitals Value Taken Time  BP 143/89 10/09/18 1002  Temp    Pulse 104 10/09/18 1004  Resp 24 10/09/18 1004  SpO2 100 % 10/09/18 1004  Vitals shown include unvalidated device data.  Last Pain:  Vitals:   10/09/18 9826  TempSrc:   PainSc: 0-No pain         Complications: No apparent anesthesia complications

## 2018-10-09 NOTE — Progress Notes (Signed)
Received Pt form PACU, AOx4, VSS. Oriented to room, placed call bell and phone within reach. Will continue to monitor.

## 2018-10-09 NOTE — Anesthesia Procedure Notes (Signed)
Procedure Name: Intubation Date/Time: 10/09/2018 7:32 AM Performed by: Bryson Corona, CRNA Pre-anesthesia Checklist: Patient identified, Emergency Drugs available, Suction available and Patient being monitored Patient Re-evaluated:Patient Re-evaluated prior to induction Oxygen Delivery Method: Circle System Utilized Preoxygenation: Pre-oxygenation with 100% oxygen Induction Type: IV induction Ventilation: Mask ventilation without difficulty Laryngoscope Size: Mac and 3 Grade View: Grade I Tube type: Oral Tube size: 7.0 mm Number of attempts: 1 Airway Equipment and Method: Stylet and Oral airway Placement Confirmation: ETT inserted through vocal cords under direct vision,  positive ETCO2 and breath sounds checked- equal and bilateral Secured at: 22 cm Tube secured with: Tape Dental Injury: Teeth and Oropharynx as per pre-operative assessment

## 2018-10-09 NOTE — Interval H&P Note (Signed)
History and Physical Interval Note:  3/76/2831 5:17 AM  Mariah Weaver  has presented today for surgery, with the diagnosis of EPIGASTRIC HERNIA.  The various methods of treatment have been discussed with the patient and family. After consideration of risks, benefits and other options for treatment, the patient has consented to  Procedure(s): [place] (N/A) as a surgical intervention.  The patient's history has been reviewed, patient examined, no change in status, stable for surgery.  I have reviewed the patient's chart and labs.  Questions were answered to the patient's satisfaction.     [name]

## 2018-10-09 NOTE — Discharge Instructions (Signed)
[facility] ______[facility] °LAPAROSCOPIC SURGERY: POST OP INSTRUCTIONS °Always review your discharge instruction sheet given to you by the facility where your surgery was performed. °IF YOU HAVE DISABILITY OR FAMILY LEAVE FORMS, YOU MUST BRING THEM TO THE OFFICE FOR PROCESSING.   °DO NOT GIVE THEM TO YOUR DOCTOR. ° °1. A prescription for pain medication may be given to you upon discharge.  Take your pain medication as prescribed, if needed.  If narcotic pain medicine is not needed, then you may take acetaminophen (Tylenol) or ibuprofen (Advil) as needed. °2. Take your usually prescribed medications unless otherwise directed. °3. If you need a refill on your pain medication, please contact your pharmacy.  They will contact our office to request authorization. Prescriptions will not be filled after 5pm or on week-ends. °4. You should follow a light diet the first few days after arrival home, such as soup and crackers, etc.  Be sure to include lots of fluids daily. °5. Most patients will experience some swelling and bruising in the area of the incisions.  Ice packs will help.  Swelling and bruising can take several days to resolve.  °6. It is common to experience some constipation if taking pain medication after surgery.  Increasing fluid intake and taking a stool softener (such as Colace) will usually help or prevent this problem from occurring.  A mild laxative (Milk of Magnesia or Miralax) should be taken according to package instructions if there are no bowel movements after 48 hours. °7. Unless discharge instructions indicate otherwise, you may remove your bandages 24-48 hours after surgery, and you may shower at that time.  You may have steri-strips (small skin tapes) in place directly over the incision.  These strips should be left on the skin for 7-10 days.  If your surgeon used skin glue on the incision, you may shower in 24 hours.  The glue will flake off over the next 2-3 weeks.  Any sutures or  staples will be removed at the office during your follow-up visit. °8. ACTIVITIES:  You may resume regular (light) daily activities beginning the next day--such as daily self-care, walking, climbing stairs--gradually increasing activities as tolerated.  You may have sexual intercourse when it is comfortable.  Refrain from any heavy lifting or straining until approved by your doctor. °a. You may drive when you are no longer taking prescription pain medication, you can comfortably wear a seatbelt, and you can safely maneuver your car and apply brakes. °b. RETURN TO WORK:  __________________________________________________________ °9. You should see your doctor in the office for a follow-up appointment approximately 2-3 weeks after your surgery.  Make sure that you call for this appointment within a day or two after you arrive home to insure a convenient appointment time. °10. OTHER INSTRUCTIONS: __________________________________________________________________________________________________________________________ __________________________________________________________________________________________________________________________ °WHEN TO CALL YOUR DOCTOR: °1. Fever over 101.0 °2. Inability to urinate °3. Continued bleeding from incision. °4. Increased pain, redness, or drainage from the incision. °5. Increasing abdominal pain ° °The clinic staff is available to answer your questions during regular business hours.  Please don’t hesitate to call and ask to speak to one of the nurses for clinical concerns.  If you have a medical emergency, go to the nearest emergency room or call 911.  A surgeon from [facility] is always on call at the hospital. °[address], [address], [place], [place]  [address] ? [address] [address], [place], [place]   [address] °[phone] ? [phone] ? FAX [phone] °Web site:   [email or web address] ° °••••••••• ° ° °Managing Your Pain After Surgery Without  Opioids ° ° ° °Thank you for participating in our program to help patients manage their pain after surgery without opioids. This is part of our effort to provide you with the best care possible, without exposing you or your family to the risk that opioids pose. ° °What pain can I expect after surgery? °You can expect to have some pain after surgery. This is normal. The pain is typically worse the day after surgery, and quickly begins to get better. °Many studies have found that many patients are able to manage their pain after surgery with Over-the-Counter (OTC) medications such as Tylenol and Motrin. If you have a condition that does not allow you to take Tylenol or Motrin, notify your surgical team. ° °How will I manage my pain? °The best strategy for controlling your pain after surgery is around the clock pain control with Tylenol (acetaminophen) and Motrin (ibuprofen or Advil). Alternating these medications with each other allows you to maximize your pain control. In addition to Tylenol and Motrin, you can use heating pads or ice packs on your incisions to help reduce your pain. ° °How will I alternate your regular strength over-the-counter pain medication? °You will take a dose of pain medication every three hours. °; Start by taking 650 mg of Tylenol (2 pills of 325 mg) °; 3 hours later take 600 mg of Motrin (3 pills of 200 mg) °; 3 hours after taking the Motrin take 650 mg of Tylenol °; 3 hours after that take 600 mg of Motrin. ° ° °- 1 - ° °See example - if your first dose of Tylenol is at 12:00 PM ° ° °12:00 PM Tylenol 650 mg (2 pills of 325 mg)  °3:00 PM Motrin 600 mg (3 pills of 200 mg)  °6:00 PM Tylenol 650 mg (2 pills of 325 mg)  °9:00 PM Motrin 600 mg (3 pills of 200 mg)  °Continue alternating every 3 hours  ° °We recommend that you follow this schedule around-the-clock for at least 3 days after surgery, or until you feel that it is no longer needed. Use the table on the last page of this handout to  keep track of the medications you are taking. °Important: °Do not take more than 3000mg of Tylenol or 3200mg of Motrin in a 24-hour period. °Do not take ibuprofen/Motrin if you have a history of bleeding stomach ulcers, severe kidney disease, &/or actively taking a blood thinner ° °What if I still have pain? °If you have pain that is not controlled with the over-the-counter pain medications (Tylenol and Motrin or Advil) you might have what we call “breakthrough” pain. You will receive a prescription for a small amount of an opioid pain medication such as Oxycodone, Tramadol, or Tylenol with Codeine. Use these opioid pills in the first 24 hours after surgery if you have breakthrough pain. Do not take more than 1 pill every 4-6 hours. ° °If you still have uncontrolled pain after using all opioid pills, don't hesitate to call our staff using the number provided. We will help make sure you are managing your pain in the best way possible, and if necessary, we can provide a prescription for additional pain medication. ° ° °Day 1   ° °Time  °Name of Medication Number of pills taken  °Amount of Acetaminophen  °Pain Level  ° °Comments  °AM PM       °AM PM       °  AM PM       °AM PM       °AM PM       °AM PM       °AM PM       °AM PM       °Total Daily amount of Acetaminophen °Do not take more than  3,000 mg per day    ° ° °Day 2   ° °Time  °Name of Medication Number of pills °taken  °Amount of Acetaminophen  °Pain Level  ° °Comments  °AM PM       °AM PM       °AM PM       °AM PM       °AM PM       °AM PM       °AM PM       °AM PM       °Total Daily amount of Acetaminophen °Do not take more than  3,000 mg per day    ° ° °Day 3   ° °Time  °Name of Medication Number of pills taken  °Amount of Acetaminophen  °Pain Level  ° °Comments  °AM PM       °AM PM       °AM PM       °AM PM       ° ° ° °AM PM       °AM PM       °AM PM       °AM PM       °Total Daily amount of Acetaminophen °Do not take more than  3,000 mg per day    ° ° °Day  4   ° °Time  °Name of Medication Number of pills taken  °Amount of Acetaminophen  °Pain Level  ° °Comments  °AM PM       °AM PM       °AM PM       °AM PM       °AM PM       °AM PM       °AM PM       °AM PM       °Total Daily amount of Acetaminophen °Do not take more than  3,000 mg per day    ° ° °Day 5   ° °Time  °Name of Medication Number °of pills taken  °Amount of Acetaminophen  °Pain Level  ° °Comments  °AM PM       °AM PM       °AM PM       °AM PM       °AM PM       °AM PM       °AM PM       °AM PM       °Total Daily amount of Acetaminophen °Do not take more than  3,000 mg per day    ° ° ° °Day 6   ° °Time  °Name of Medication Number of pills °taken  °Amount of Acetaminophen  °Pain Level  °Comments  °AM PM       °AM PM       °AM PM       °AM PM       °AM PM       °AM PM       °AM PM       °AM PM       °Total Daily amount   of Acetaminophen °Do not take more than  3,000 mg per day    ° ° °Day 7   ° °Time  °Name of Medication Number of pills taken  °Amount of Acetaminophen  °Pain Level  ° °Comments  °AM PM       °AM PM       °AM PM       °AM PM       °AM PM       °AM PM       °AM PM       °AM PM       °Total Daily amount of Acetaminophen °Do not take more than  3,000 mg per day    ° ° ° ° °For additional information about how and where to safely dispose of unused opioid °medications - https://www.morepowerfulnc.org ° °Disclaimer: This document contains information and/or instructional materials adapted from [facility] for the typical patient with your condition. It does not replace medical advice from your health care provider because your experience may differ from that of the °typical patient. Talk to your health care provider if you have any questions about this °document, your condition or your treatment plan. °Adapted from [facility] ° ° °

## 2018-10-09 NOTE — Anesthesia Postprocedure Evaluation (Signed)
Anesthesia Post Note  Patient: Mariah Weaver  Procedure(s) Performed: LAPAROSCOPIC ASSISTED REPAIR OF EPIGASTRIC HERNIA WITH  MESH (N/A Abdomen)     Patient location during evaluation: PACU Anesthesia Type: General Level of consciousness: awake and alert Pain management: pain level controlled Vital Signs Assessment: post-procedure vital signs reviewed and stable Respiratory status: spontaneous breathing, nonlabored ventilation and respiratory function stable Cardiovascular status: blood pressure returned to baseline and stable Postop Assessment: no apparent nausea or vomiting Anesthetic complications: no    Last Vitals:  Vitals:   10/09/18 1035 10/09/18 1105  BP: 125/64 (!) 147/78  Pulse: 70 73  Resp: 20 18  Temp: 36.6 C 36.4 C  SpO2: 95% 97%                  Audry Pili

## 2018-10-09 NOTE — Op Note (Signed)
Patient Name:           Mariah Weaver   Date of Surgery:        10/09/2018  Pre op Diagnosis:      Epigastric hernia and umbilical hernia  Post op Diagnosis:    Same  Procedure:                 Laparoscopic assisted repair epigastric and umbilical hernia with mesh (ventral Lex ST, 25 cm x 20 cm)                                      Bilateral transabdominal preperitoneal nerve block with Exparel, laparoscopically performed  Type of repair -mesh and muscle closure  (choices - primary suture, mesh, or component)  Name of mesh -ventral Lex ST  Size of mesh - Length 25 cm cm, Width 20 cm cm  Mesh overlap -7 to 8  cm  Placement of mesh -underlay, beneath fascia and into peritoneal cavity  (choices - beneath fascia and into peritoneal cavity, beneath fascia but external to peritoneal cavity, between the muscle and fascia, above or external to fascia)   Surgeon:                     Angelia MouldHaywood M. Derrell LollingIngram, M.D., FACS  Assistant:                      Or staff  Operative Indications:   . This is a 33 year old female who returns for repair of a large midline ventral hernia.       I last saw her on December 15, 2017 when she was [redacted] weeks pregnant and exam revealed a midline hernia just above the umbilicus the size of a grapefruit. Felt like omentum was in the hernia and I could reduce it mostly. She made it through her pregnancy. Delivered a term infant by C-section  She went ahead with the CT scan which showed a hernia sac about 10 cm in size with the transverse colon present but the neck of the sac was no more than 5 cm. 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 Social history she is married. Now with 2 children.       The hernia seems completely reducible .  The defect is more easily defined and feels like about 4 cm at most. I think we can do this laparoscopically, do a hybrid procedure and close the fascia and do underlay mesh. This may have to be converted to open and she knows that.      She'll be scheduled for laparoscopic-assisted repair of epigastric hernia with underlay mesh, possible open repair.  She agrees with this plan.  Operative Findings:       The hernia defect was in the midline above the umbilicus.  It measured about 4 or 5 cm.  There was also a defect at the umbilicus or just below the umbilicus.  The hernia contents easily reduced and there were no adhesions.  There was no evidence of peritoneal or visceral disease.  I took the PAL falciform ligament down extensively.  I made a 5 cm incision in the midline over the hernia sac during the [place].  This allowed me to completely strip the hernia sac, reapproximate the muscles with Novafil, and to close down the space with Vicryl sutures.  The underlay mesh was 25 cm x 20 cm and deployed nicely.  Procedure in Detail:          Following the induction of general endotracheal anesthesia the patient's abdomen was prepped and draped in a sterile fashion.  Surgical timeout was performed.  Intravenous antibiotics were given.  A small puncture wound was placed in the left subcostal region.  Veress needle was inserted and tested.  Pneumoperitoneum was created.  The Veress needle was removed and a 5 mm optical trocar was inserted.  Four-quadrant inspection revealed no bleeding or injury.  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  I placed a total of 3 trochars on the left and 2 on the right.      Using a 50% solution of Exparel I performed bilateral intercostal and sensory nerve blocks in the mid axillary line on both the right and the left side.  20 cc on the right and 20 cc on the left.  This went well under direct vision.      There were no incarcerated contents.  Using the harmonic scalpel I  extensively took down the falciform ligament.  I then made a 2 inch incision in the midline overlying the large hernia sac.  I entered the hernia sac and debrided the entire sac circumferentially.  I trimmed the attenuated tissue at the edges of the defect.  The defect was about 4 cm to 5 cm at most.  The defect was closed with interrupted sutures of #1 Novafil placed in a vertical fashion.  I then closed down the subcutaneous tissue with interrupted 3-0 Vicryl sutures and closed the skin with a running subcuticular 4-0 Monocryl.      Pneumoperitoneum was then reestablished.  The muscle closure looked good.  There was no bleeding.  Again no injury on four-quadrant inspection.  I drew a template on the abdominal wall.  I chose to use a 20 x 25 cm piece of mesh.  I brought this to the operative field and placed it on the abdominal wall.  I drew a template planning 6 suture fixation sites.  I very carefully marked the mesh so that the rough side to be up toward the abdominal wall and the smooth side down toward the viscera.  #1 Novafil sutures were placed at the edge of the mesh and the 6 suture fixation sites.  The mesh was then moistened, rolled up and inserted into the abdominal cavity.  The mesh was spread out and positioned carefully.  At each of the supra suture fixation sites I made a puncture wound.  Using the Endo Close device I drew the Novafil sutures up through the abdominal wall being careful to take about a 1 cm bite of tissue.  After all of this was done I lifted all 6 sutures up and the mesh deployed very nicely.  All 6 sutures were tied.  I then further secured the mesh with the secure strap device.  A double crown technique was performed.  75 firings of the secure strap device provided a very secure closure.  This was inspected from the right and the left on about 3 occasions and there did not appear to be any defects or gaps.  There was no bleeding.  Final four-quadrant inspection showed no bleeding  or injury to the intestine.  The trochars were removed  and the pneumoperitoneum was released.  The trocar sites were closed with 4-0 Monocryl subcuticular sutures.  Dermabond was placed on all the wounds.  Dry bandages and an abdominal binder was placed.  The patient tolerated the procedure well and was taken to PACU in stable condition.  EBL 20 cc or less.  Counts correct.  Complications none.    Addendum: I logged onto the PMP aware website and reviewed her prescription medication history     Lynsie Mcwatters M. Derrell LollingIngram, M.D., FACS General and Minimally Invasive Surgery Breast and Colorectal Surgery  10/09/2018 9:47 AM

## 2018-10-10 DIAGNOSIS — K439 Ventral hernia without obstruction or gangrene: Secondary | ICD-10-CM | POA: Diagnosis not present

## 2018-10-10 NOTE — Plan of Care (Signed)
  Problem: Education: Goal: Knowledge of General Education information will improve Description: Including pain rating scale, medication(s)/side effects and non-pharmacologic comfort measures Outcome: Completed/Met   Problem: Health Behavior/Discharge Planning: Goal: Ability to manage health-related needs will improve Outcome: Completed/Met   Problem: Clinical Measurements: Goal: Ability to maintain clinical measurements within normal limits will improve Outcome: Completed/Met Goal: Will remain free from infection Outcome: Completed/Met Goal: Diagnostic test results will improve Outcome: Completed/Met Goal: Respiratory complications will improve Outcome: Completed/Met Goal: Cardiovascular complication will be avoided Outcome: Completed/Met   Problem: Activity: Goal: Risk for activity intolerance will decrease Outcome: Completed/Met   Problem: Nutrition: Goal: Adequate nutrition will be maintained Outcome: Completed/Met   Problem: Coping: Goal: Level of anxiety will decrease Outcome: Completed/Met   Problem: Elimination: Goal: Will not experience complications related to bowel motility Outcome: Completed/Met Goal: Will not experience complications related to urinary retention Outcome: Completed/Met   Problem: Pain Managment: Goal: General experience of comfort will improve Outcome: Completed/Met   Problem: Safety: Goal: Ability to remain free from injury will improve Outcome: Completed/Met   Problem: Skin Integrity: Goal: Risk for impaired skin integrity will decrease Outcome: Completed/Met   Problem: Education: Goal: Required Educational Video(s) Outcome: Completed/Met   Problem: Clinical Measurements: Goal: Ability to maintain clinical measurements within normal limits will improve Outcome: Completed/Met Goal: Postoperative complications will be avoided or minimized Outcome: Completed/Met   Problem: Skin Integrity: Goal: Demonstration of wound healing  without infection will improve Outcome: Completed/Met

## 2018-10-10 NOTE — Discharge Summary (Signed)
Physician Discharge Summary  Patient ID: Mariah Weaver MRN: [id number] DOB/AGE: 1985-12-06 33 y.o.  PCP: Patient, No Pcp Per  Admit date: 10/09/2018 Discharge date: 10/10/2018  Admission Diagnoses:  Ventral hernia  Discharge Diagnoses:  same  Principal Problem:   Abdominal wall hernia Active Problems:   Ventral hernia   Surgery:  Lap assisted ventral hernia repair with mesh by Dr. Dalbert Batman  Discharged Condition: improved  Hospital Course:   Had surgery on Friday by Dr. Dalbert Batman.  Sore and moving slowly but ready for discharge on Saturday.    Consults: none  Significant Diagnostic Studies: none    Discharge Exam: Blood pressure 105/60, pulse 100, temperature 98.7 F (37.1 C), temperature source Oral, resp. rate 16, height 5\' 4"  (1.626 m), weight 115.9 kg, SpO2 96 %, unknown if currently breastfeeding. Incisions covered by abdominal binder.   Disposition: Discharge disposition: 01-Home or Self Care       Discharge Instructions    Diet - low sodium heart healthy   Complete by: As directed    Discharge wound care:   Complete by: As directed    Wear abdominal binder especially when up and about.   Increase activity slowly   Complete by: As directed      Allergies as of 10/10/2018      Reactions   Sulfa Antibiotics Hives, Itching      Medication List    STOP taking these medications   acetaminophen 325 MG tablet Commonly known as: TYLENOL     TAKE these medications   HYDROcodone-acetaminophen 5-325 MG tablet Commonly known as: Norco Take 1-2 tablets by mouth every 6 (six) hours as needed for moderate pain or severe pain.            Discharge Care Instructions  (From admission, onward)         Start     Ordered   10/10/18 0000  Discharge wound care:    Comments: Wear abdominal binder especially when up and about.   10/10/18 1034         Follow-up Information    Fanny Skates, MD. Schedule an appointment as soon as possible for a visit in 3  weeks.   Specialty: General Surgery Contact information: [place] [address] [phone]           Signed: Pedro Earls 10/10/2018, 10:35 AM

## 2018-10-10 NOTE — Progress Notes (Signed)
Mariah Weaver to be D/C'd  per MD order. Discussed with the patient and all questions fully answered.  VSS, Skin clean, dry and intact without evidence of skin break down, no evidence of skin tears noted.  IV catheter discontinued intact. Site without signs and symptoms of complications. Dressing and pressure applied.  An After Visit Summary was printed and given to the patient. Patient received prescription.  D/c education completed with patient/family including follow up instructions, medication list, d/c activities limitations if indicated, with other d/c instructions as indicated by MD - patient able to verbalize understanding, all questions fully answered.   Patient instructed to return to ED, call 911, or call MD for any changes in condition.   Patient to be escorted via [place], and D/C home via private auto.

## 2018-10-12 ENCOUNTER — Encounter (HOSPITAL_COMMUNITY): Payer: Self-pay | Admitting: General Surgery

## 2019-03-19 ENCOUNTER — Other Ambulatory Visit: Payer: Self-pay

## 2019-03-19 ENCOUNTER — Ambulatory Visit (HOSPITAL_COMMUNITY)
Admission: EM | Admit: 2019-03-19 | Discharge: 2019-03-19 | Disposition: A | Discharge status: Home / Self Care | Payer: Medicaid Other | Attending: Family Medicine | Admitting: Family Medicine

## 2019-03-19 ENCOUNTER — Encounter (HOSPITAL_COMMUNITY): Payer: Self-pay | Admitting: Emergency Medicine

## 2019-03-19 DIAGNOSIS — N3001 Acute cystitis with hematuria: Secondary | ICD-10-CM

## 2019-03-19 DIAGNOSIS — Z3202 Encounter for pregnancy test, result negative: Secondary | ICD-10-CM | POA: Diagnosis not present

## 2019-03-19 DIAGNOSIS — B9689 Other specified bacterial agents as the cause of diseases classified elsewhere: Secondary | ICD-10-CM | POA: Diagnosis present

## 2019-03-19 DIAGNOSIS — N760 Acute vaginitis: Secondary | ICD-10-CM | POA: Diagnosis present

## 2019-03-19 LAB — POCT URINALYSIS DIP (DEVICE)
Bilirubin Urine: NEGATIVE
Glucose, UA: NEGATIVE mg/dL
Ketones, ur: NEGATIVE mg/dL
Nitrite: NEGATIVE
Protein, ur: NEGATIVE mg/dL
Specific Gravity, Urine: >=1.03 (ref 1.005–1.030)
Urobilinogen, UA: 0.2 mg/dL (ref 0.0–1.0)
pH: 6.5 (ref 5.0–8.0)

## 2019-03-19 LAB — POC URINE PREG, ED: Preg Test, Ur: NEGATIVE

## 2019-03-19 LAB — POCT PREGNANCY, URINE: Preg Test, Ur: NEGATIVE

## 2019-03-19 MED ORDER — METRONIDAZOLE 500 MG PO TABS: 500.0000 mg | ORAL_TABLET | Freq: Two times a day (BID) | ORAL | 0 refills | Status: AC

## 2019-03-19 MED ORDER — CEPHALEXIN 500 MG PO CAPS: 500.0000 mg | ORAL_CAPSULE | Freq: Four times a day (QID) | ORAL | 0 refills | Status: DC

## 2019-03-19 NOTE — Discharge Instructions (Addendum)
Take the metronidazole and Keflex as prescribed.  We are sending a Urine culture, if we need to change your keflex, we will call you.  If you are not having improvements in symptoms in 1 week or have sudden worsening, please return to urgent care.  If you have severe lower abdominal pain, please be evaluated at an Emergency Department.

## 2019-03-19 NOTE — ED Triage Notes (Signed)
Pt c/o itching and burning with urination, and also pelvic pain and flank pain. Symptoms 2-3 days.

## 2019-03-19 NOTE — ED Provider Notes (Signed)
[facility]    CSN: [id number] Arrival date & time: 03/19/19  1215      History   Chief Complaint Chief Complaint  Patient presents with  . Flank Pain  . Pelvic Pain    HPI Mariah Weaver is a 34 y.o. female.   Patient reports to urgent care today for 1 week of vaginal burning with and without urination, itching and increased discharge. She notes a white liquid discharge that has a fishy odor. She notes increased frequency of urination and burning with urination. She denies blood in urine. She reports starting yesterday some mild lower back pain and lower abdominal pain. She denies painful sexual intercourse. She does not have concern for STD. She denies fever and chills. She has not taken anything for the pain or other symptoms. She has not had recent anti-biotics or anti-fungal treatment.  She is 7 months post C section. She is not breast feeding.      Past Medical History:  Diagnosis Date  . Arthritis    left knee  . 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 Chronic back pain    Hx - rarely has back pain per patient at 09/30/18 PAT visit  . Ear malformation   . 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 Hx of varicella   . Klippel Trenaunay syndrome   . Scoliosis   . Scoliosis   . Umbilical hernia     Patient Active Problem List   Diagnosis Date Noted  . Ventral hernia 10/09/2018  . H/O: C-section 07/13/2018  . Abdominal wall hernia 01/10/2018  . 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 History of cesarean delivery, currently pregnant 01/07/2018  . CHEST PAIN 11/18/2006    Past Surgical History:  Procedure Laterality Date  . CESAREAN SECTION  04/07/2012   Procedure: CESAREAN SECTION;  Surgeon: Mitchel Honour, DO;  Location: [facility];  Service: Obstetrics;  Laterality: N/A;  Primary Cesarean Section Delivery Baby  Boy  @ [phone],   [facility] 9/9  . CESAREAN SECTION N/A 07/13/2018   Procedure: CESAREAN SECTION;  Surgeon: Conan Bowens, MD;   Location: [facility];  Service: Obstetrics;  Laterality: N/A;  . EAR CANALOPLASTY    . LEG SURGERY    . MANDIBLE RECONSTRUCTION    . TONSILLECTOMY    . TUBAL LIGATION  07/13/2018   Procedure: BILATERAL TUBAL LIGATION;  Surgeon: Conan Bowens, MD;  Location: [facility];  Service: Obstetrics;;  . VENTRAL HERNIA REPAIR N/A 10/09/2018   Procedure: LAPAROSCOPIC ASSISTED REPAIR OF EPIGASTRIC HERNIA WITH  MESH;  Surgeon: Claud Kelp, MD;  Location: [facility];  Service: General;  Laterality: N/A;    OB History    Gravida  2   Para  2   Term  2   Preterm      AB      Living  2     SAB      TAB      Ectopic      Multiple  0   Live Births  2            Home Medications    Prior to Admission medications   Medication Sig Start Date End Date Taking? Authorizing Provider  cephALEXin (KEFLEX) 500 MG capsule Take 1 capsule (500 mg total) by mouth 4 (four) times daily. 03/19/19   Cyndy Braver, Veryl Speak, PA-C  HYDROcodone-acetaminophen (NORCO) 5-325 MG tablet  Take 1-2 tablets by mouth every 6 (six) hours as needed for moderate pain or severe pain. 10/09/18   Claud Kelp, MD  metroNIDAZOLE (FLAGYL) 500 MG tablet Take 1 tablet (500 mg total) by mouth 2 (two) times daily for 7 days. 03/19/19 03/26/19  Verline Kong, Veryl Speak, PA-C    Family History Family History  Adopted: Yes  Problem Relation Age of Onset  . Heart disease Mother   . Leukemia Father   . Non-Hodgkin's lymphoma Brother     Social History Social History   Tobacco Use  . Smoking status: Never Smoker  . Smokeless tobacco: Never Used  Substance Use Topics  . Alcohol use: Yes    Comment: occasional wine  . Drug use: No     Allergies   Sulfa antibiotics   Review of Systems Review of Systems  Constitutional: Positive for fatigue. Negative for chills and fever.  HENT: Negative for congestion, rhinorrhea and sore throat.   Eyes: Negative for pain and visual disturbance.  Respiratory: Negative for cough and shortness of  breath.   Cardiovascular: Negative for chest pain and palpitations.  Gastrointestinal: Positive for abdominal pain. Negative for blood in stool, diarrhea, nausea and vomiting.  Genitourinary: Positive for dysuria, frequency, vaginal discharge and vaginal pain. Negative for decreased urine volume, difficulty urinating, dyspareunia, flank pain, genital sores, hematuria, menstrual problem, pelvic pain, urgency and vaginal bleeding.  Musculoskeletal: Positive for back pain. Negative for arthralgias and joint swelling.  Skin: Negative for color change and rash.  Neurological: Negative for syncope and headaches.  Hematological: Negative for adenopathy.  All other systems reviewed and are negative.    Physical Exam Triage Vital Signs ED Triage Vitals  Enc Vitals Group     BP 03/19/19 1256 113/73     Pulse Rate 03/19/19 1256 (!) 51     Resp 03/19/19 1256 18     Temp 03/19/19 1256 98.4 F (36.9 C)     Temp src --      SpO2 03/19/19 1256 100 %     Weight --      Height --      Head Circumference --      Peak Flow --      Pain Score 03/19/19 1258 6     Pain Loc --      Pain Edu? --      Excl. in GC? --    No data found.  Updated Vital Signs BP 113/73   Pulse (!) 51   Temp 98.4 F (36.9 C)   Resp 18   LMP 03/12/2019   SpO2 100%   Visual Acuity Right Eye Distance:   Left Eye Distance:   Bilateral Distance:    Right Eye Near:   Left Eye Near:    Bilateral Near:     Physical Exam Vitals and nursing note reviewed. Exam conducted with a chaperone present.  Constitutional:      General: She is not in acute distress.    Appearance: She is well-developed. She is obese. She is not ill-appearing.  HENT:     Head: Normocephalic and atraumatic.  Eyes:     Conjunctiva/sclera: Conjunctivae normal.     Pupils: Pupils are equal, round, and reactive to light.  Cardiovascular:     Heart sounds: No murmur.  Pulmonary:     Effort: Pulmonary effort is normal. No respiratory distress.    Abdominal:     General: Bowel sounds are normal.     Palpations: Abdomen is soft.  Tenderness: There is abdominal tenderness (Bilateral lower quadrants and suprpubic). There is no right CVA tenderness, left CVA tenderness or guarding.  Genitourinary:    Exam position: Lithotomy position.     Pubic Area: No rash.      Labia:        Right: No rash or tenderness.        Left: No rash or tenderness.      Urethra: No prolapse or urethral pain.     Vagina: Vaginal discharge (white liquidy discharge) and erythema present.     Cervix: Normal.     Comments: Bi-manual deferred.  Musculoskeletal:     Right lower leg: No edema.     Left lower leg: No edema.  Skin:    General: Skin is warm and dry.  Neurological:     General: No focal deficit present.     Mental Status: She is alert and oriented to person, place, and time.  Psychiatric:        Mood and Affect: Mood normal.        Behavior: Behavior normal.        Thought Content: Thought content normal.        Judgment: Judgment normal.      UC Treatments / Results  Labs (all labs ordered are listed, but only abnormal results are displayed) Labs Reviewed  POCT URINALYSIS DIP (DEVICE) - Abnormal; Notable for the following components:      Result Value   Hgb urine dipstick SMALL (*)    Leukocytes,Ua TRACE (*)    All other components within normal limits  URINE CULTURE  POCT PREGNANCY, URINE  POC URINE PREG, ED    EKG   Radiology No results found.  Procedures Procedures (including critical care time)  Medications Ordered in UC Medications - No data to display  Initial Impression / Assessment and Plan / UC Course  I have reviewed the triage vital signs and the nursing notes.  Pertinent labs & imaging results that were available during my care of the patient were reviewed by me and considered in my medical decision making (see chart for details).     #UTI #Bacterial Vaginosis - UA with trace leukos and hgb and  patient is symptomatic. Treating with Keflex and culture sent. - BV: discharge and history consistent with BV. Likely this began prior to UTI. No concern for STI and exam not concerning for PID. Treating with metronidazole x 7 days, not lactating.   Return precautions discussed.   Final Clinical Impressions(s) / UC Diagnoses   Final diagnoses:  Bacterial vaginosis  Acute cystitis with hematuria     Discharge Instructions     Take the metronidazole and Keflex as prescribed.  We are sending a Urine culture, if we need to change your keflex, we will call you.  If you are not having improvements in symptoms in 1 week or have sudden worsening, please return to urgent care.  If you have severe lower abdominal pain, please be evaluated at an Emergency Department.      ED Prescriptions    Medication Sig Dispense Auth. Provider   cephALEXin (KEFLEX) 500 MG capsule Take 1 capsule (500 mg total) by mouth 4 (four) times daily. 20 capsule Jovan Colligan, Veryl Speak, PA-C   metroNIDAZOLE (FLAGYL) 500 MG tablet Take 1 tablet (500 mg total) by mouth 2 (two) times daily for 7 days. 14 tablet Renleigh Ouellet, Veryl Speak, PA-C     PDMP not reviewed this encounter.   Kenderick Kobler, Gerilyn Pilgrim  E, PA-C 03/19/19 1358

## 2019-03-20 LAB — URINE CULTURE

## 2019-03-28 ENCOUNTER — Emergency Department (HOSPITAL_BASED_OUTPATIENT_CLINIC_OR_DEPARTMENT_OTHER): Payer: Medicaid Other

## 2019-03-28 ENCOUNTER — Emergency Department (HOSPITAL_BASED_OUTPATIENT_CLINIC_OR_DEPARTMENT_OTHER)
Admission: EM | Admit: 2019-03-28 | Discharge: 2019-03-28 | Disposition: A | Discharge status: Home / Self Care | Payer: Medicaid Other | Attending: Emergency Medicine | Admitting: Emergency Medicine

## 2019-03-28 ENCOUNTER — Other Ambulatory Visit: Payer: Self-pay

## 2019-03-28 ENCOUNTER — Encounter (HOSPITAL_BASED_OUTPATIENT_CLINIC_OR_DEPARTMENT_OTHER): Payer: Self-pay | Admitting: Emergency Medicine

## 2019-03-28 DIAGNOSIS — N10 Acute tubulo-interstitial nephritis: Secondary | ICD-10-CM | POA: Diagnosis not present

## 2019-03-28 DIAGNOSIS — N83291 Other ovarian cyst, right side: Secondary | ICD-10-CM | POA: Diagnosis not present

## 2019-03-28 DIAGNOSIS — N739 Female pelvic inflammatory disease, unspecified: Secondary | ICD-10-CM | POA: Insufficient documentation

## 2019-03-28 DIAGNOSIS — R1030 Lower abdominal pain, unspecified: Secondary | ICD-10-CM

## 2019-03-28 DIAGNOSIS — R102 Pelvic and perineal pain: Secondary | ICD-10-CM

## 2019-03-28 DIAGNOSIS — Z79899 Other long term (current) drug therapy: Secondary | ICD-10-CM | POA: Insufficient documentation

## 2019-03-28 DIAGNOSIS — R109 Unspecified abdominal pain: Secondary | ICD-10-CM

## 2019-03-28 DIAGNOSIS — N12 Tubulo-interstitial nephritis, not specified as acute or chronic: Secondary | ICD-10-CM

## 2019-03-28 DIAGNOSIS — N730 Acute parametritis and pelvic cellulitis: Secondary | ICD-10-CM

## 2019-03-28 DIAGNOSIS — N83201 Unspecified ovarian cyst, right side: Secondary | ICD-10-CM

## 2019-03-28 HISTORY — DX: Calculus of kidney: N20.0

## 2019-03-28 LAB — URINALYSIS, MICROSCOPIC (REFLEX)

## 2019-03-28 LAB — CBC WITH DIFFERENTIAL/PLATELET
Abs Immature Granulocytes: 0.01 10*3/uL (ref 0.00–0.07)
Basophils Absolute: 0 10*3/uL (ref 0.0–0.1)
Basophils Relative: 1 %
Eosinophils Absolute: 0.1 10*3/uL (ref 0.0–0.5)
Eosinophils Relative: 3 %
HCT: 44.1 % (ref 36.0–46.0)
Hemoglobin: 14.4 g/dL (ref 12.0–15.0)
Immature Granulocytes: 0 %
Lymphocytes Relative: 32 %
Lymphs Abs: 1.4 10*3/uL (ref 0.7–4.0)
MCH: 32.4 pg (ref 26.0–34.0)
MCHC: 32.7 g/dL (ref 30.0–36.0)
MCV: 99.1 fL (ref 80.0–100.0)
Monocytes Absolute: 0.4 10*3/uL (ref 0.1–1.0)
Monocytes Relative: 9 %
Neutro Abs: 2.4 10*3/uL (ref 1.7–7.7)
Neutrophils Relative %: 55 %
Platelets: 244 10*3/uL (ref 150–400)
RBC: 4.45 MIL/uL (ref 3.87–5.11)
RDW: 13.5 % (ref 11.5–15.5)
WBC: 4.3 10*3/uL (ref 4.0–10.5)
nRBC: 0 % (ref 0.0–0.2)

## 2019-03-28 LAB — COMPREHENSIVE METABOLIC PANEL
ALT: 14 U/L (ref 0–44)
AST: 14 U/L — ABNORMAL LOW (ref 15–41)
Albumin: 3.9 g/dL (ref 3.5–5.0)
Alkaline Phosphatase: 58 U/L (ref 38–126)
Anion gap: 7 (ref 5–15)
BUN: 11 mg/dL (ref 6–20)
CO2: 24 mmol/L (ref 22–32)
Calcium: 8.7 mg/dL — ABNORMAL LOW (ref 8.9–10.3)
Chloride: 105 mmol/L (ref 98–111)
Creatinine, Ser: 0.72 mg/dL (ref 0.44–1.00)
GFR calc Af Amer: >60 mL/min (ref >60)
GFR calc non Af Amer: >60 mL/min (ref >60)
Glucose, Bld: 127 mg/dL — ABNORMAL HIGH (ref 70–99)
Potassium: 3.9 mmol/L (ref 3.5–5.1)
Sodium: 136 mmol/L (ref 135–145)
Total Bilirubin: 0.9 mg/dL (ref 0.3–1.2)
Total Protein: 7.1 g/dL (ref 6.5–8.1)

## 2019-03-28 LAB — PREGNANCY, URINE: Preg Test, Ur: NEGATIVE

## 2019-03-28 LAB — WET PREP, GENITAL
Clue Cells Wet Prep HPF POC: NONE SEEN
Sperm: NONE SEEN
Trich, Wet Prep: NONE SEEN

## 2019-03-28 LAB — URINALYSIS, ROUTINE W REFLEX MICROSCOPIC
Bilirubin Urine: NEGATIVE
Glucose, UA: NEGATIVE mg/dL
Ketones, ur: NEGATIVE mg/dL
Nitrite: NEGATIVE
Protein, ur: NEGATIVE mg/dL
Specific Gravity, Urine: >1.03 — ABNORMAL HIGH (ref 1.005–1.030)
pH: 6 (ref 5.0–8.0)

## 2019-03-28 LAB — HIV ANTIBODY (ROUTINE TESTING W REFLEX): HIV Screen 4th Generation wRfx: NONREACTIVE

## 2019-03-28 MED ORDER — ONDANSETRON HCL 4 MG PO TABS: 4.0000 mg | ORAL_TABLET | Freq: Four times a day (QID) | ORAL | 0 refills | Status: DC

## 2019-03-28 MED ORDER — ONDANSETRON HCL 4 MG/2ML IJ SOLN
4.0000 mg | Freq: Once | INTRAMUSCULAR | Status: AC
  Administered 2019-03-28: 4 mg via INTRAVENOUS
  Filled 2019-03-28: qty 2

## 2019-03-28 MED ORDER — DOXYCYCLINE HYCLATE 100 MG PO TABS
100.0000 mg | ORAL_TABLET | Freq: Once | ORAL | Status: AC
  Administered 2019-03-28: 100 mg via ORAL
  Filled 2019-03-28: qty 1

## 2019-03-28 MED ORDER — SODIUM CHLORIDE 0.9 % IV BOLUS
1000.0000 mL | Freq: Once | INTRAVENOUS | Status: AC
  Administered 2019-03-28: 1000 mL via INTRAVENOUS

## 2019-03-28 MED ORDER — CIPROFLOXACIN HCL 500 MG PO TABS
500.0000 mg | ORAL_TABLET | Freq: Once | ORAL | Status: AC
  Administered 2019-03-28: 500 mg via ORAL
  Filled 2019-03-28: qty 1

## 2019-03-28 MED ORDER — DOXYCYCLINE HYCLATE 100 MG PO CAPS: 100.0000 mg | ORAL_CAPSULE | Freq: Two times a day (BID) | ORAL | 0 refills | Status: AC

## 2019-03-28 MED ORDER — CIPROFLOXACIN HCL 500 MG PO TABS: 500.0000 mg | ORAL_TABLET | Freq: Two times a day (BID) | ORAL | 0 refills | Status: DC

## 2019-03-28 MED ORDER — CEFTRIAXONE SODIUM 1 G IJ SOLR
500.0000 mg | Freq: Once | INTRAMUSCULAR | Status: AC
  Administered 2019-03-28: 13:00:00 500 mg via INTRAMUSCULAR

## 2019-03-28 MED ORDER — HYDROCODONE-ACETAMINOPHEN 5-325 MG PO TABS: 1.0000 | ORAL_TABLET | Freq: Four times a day (QID) | ORAL | 0 refills | Status: DC | PRN

## 2019-03-28 MED ORDER — FLUCONAZOLE 150 MG PO TABS
150.0000 mg | ORAL_TABLET | Freq: Once | ORAL | Status: AC
  Administered 2019-03-28: 150 mg via ORAL
  Filled 2019-03-28: qty 1

## 2019-03-28 MED ORDER — MORPHINE SULFATE (PF) 4 MG/ML IV SOLN
4.0000 mg | Freq: Once | INTRAVENOUS | Status: AC
  Administered 2019-03-28: 4 mg via INTRAVENOUS
  Filled 2019-03-28: qty 1

## 2019-03-28 MED ORDER — CEFTRIAXONE SODIUM 250 MG IJ SOLR
INTRAMUSCULAR | Status: AC
  Filled 2019-03-28: qty 500

## 2019-03-28 NOTE — ED Notes (Signed)
Patient transported to Ultrasound 

## 2019-03-28 NOTE — Discharge Instructions (Signed)
You are being treated for pelvic inflammatory disease as well as a urinary tract infection.  Take Cipro twice daily for 7 days to treat for UTI, I think this infection is likely gotten up to your kidneys causing your flank pain.  Take doxycycline twice daily for 14 days to treat for pelvic inflammatory disease.  You can use ibuprofen and Tylenol for pain, Norco as needed for breakthrough pain.  You can use Zofran as needed for nausea and vomiting.  Please follow-up with your OB/GYN if symptoms are not improving.  If you develop new or worsening symptoms return to the ED.

## 2019-03-28 NOTE — ED Provider Notes (Signed)
[facility] EMERGENCY DEPARTMENT Provider Note   CSN: [id number] Arrival date & time: 03/28/19  0957     History Chief Complaint  Patient presents with  . Flank Pain    Mariah Weaver is a 34 y.o. female.  Mariah Weaver is a 34 y.o. female with a history of scoliosis, chronic back pain, arthritis, umbilical hernia, Klippel-Trenaunay syndrome, who presents to the ED for evaluation of continued lower abdominal back and flank pain.  She states that she was seen at [facility] urgent care 9 days ago, at that time symptoms had been occurring for a week.  She reports symptoms started with vaginal discharge and irritation as well as some urinary discomfort.  She states she then developed lower abdominal pain and intermittent tightness across her low back, as well as pain in both flanks.  When she was seen at urgent care her urine showed trace leukocytes and she was told she had a UTI, culture came back with multiple species.  They performed a pelvic exam and diagnosed patient with BV, but I do not see any swabs or testing in the system.  Patient was also told that given her flank pain she may have a kidney stone.  She despite taking Keflex for UTI and Flagyl for BV she has had no improvement in her symptoms.  She continues to have copious white vaginal discharge, lower abdominal back pain and intermittent flank pain.  She has had some chills and fatigue but no measured fevers, no nausea or vomiting.  Last menstrual cycle on 12/25, was normal.        Past Medical History:  Diagnosis Date  . Arthritis    left knee  . 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 Chronic back pain    Hx - rarely has back pain per patient at 09/30/18 PAT visit  . Ear malformation   . 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 Hx of varicella   . Kidney stones   . Klippel Trenaunay syndrome   . Scoliosis   . Scoliosis   . Umbilical hernia     Patient Active Problem List   Diagnosis Date  Noted  . Ventral hernia 10/09/2018  . H/O: C-section 07/13/2018  . Abdominal wall hernia 01/10/2018  . 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 History of cesarean delivery, currently pregnant 01/07/2018  . CHEST PAIN 11/18/2006    Past Surgical History:  Procedure Laterality Date  . CESAREAN SECTION  04/07/2012   Procedure: CESAREAN SECTION;  Surgeon: Mitchel HonourMegan Morris, DO;  Location: [facility] ORS;  Service: Obstetrics;  Laterality: N/A;  Primary Cesarean Section Delivery Baby  Boy  @ [phone],   [facility] 9/9  . CESAREAN SECTION N/A 07/13/2018   Procedure: CESAREAN SECTION;  Surgeon: Conan Bowensavis, Kelly M, MD;  [facility];  Service: Obstetrics;  Laterality: N/A;  . EAR CANALOPLASTY    . LEG SURGERY    . MANDIBLE RECONSTRUCTION    . TONSILLECTOMY    . TUBAL LIGATION  07/13/2018   Procedure: BILATERAL TUBAL LIGATION;  Surgeon: Conan Bowensavis, Kelly M, MD;  Location: [facility] LD ORS;  Service: Obstetrics;;  . VENTRAL HERNIA REPAIR N/A 10/09/2018   Procedure: LAPAROSCOPIC ASSISTED REPAIR OF EPIGASTRIC HERNIA WITH  MESH;  Surgeon: Claud KelpIngram, Haywood, MD;  [facility];  Service: General;  Laterality: N/A;     OB History    Gravida  2   Para  2   Term  2   Preterm      AB      Living  2     SAB      TAB      Ectopic      Multiple  0   Live Births  2           Family History  Adopted: Yes  Problem Relation Age of Onset  . Heart disease Mother   . Leukemia Father   . Non-Hodgkin's lymphoma Brother     Social History   Tobacco Use  . Smoking status: Never Smoker  . Smokeless tobacco: Never Used  Substance Use Topics  . Alcohol use: Yes    Comment: occasional wine  . Drug use: No    Home Medications Prior to Admission medications   Medication Sig Start Date End Date Taking? Authorizing Provider  cephALEXin (KEFLEX) 500 MG capsule Take 1 capsule (500 mg total) by mouth 4 (four) times daily. 03/19/19   Darr, Veryl Speak, PA-C  HYDROcodone-acetaminophen (NORCO) 5-325 MG tablet Take 1-2  tablets by mouth every 6 (six) hours as needed for moderate pain or severe pain. 10/09/18   Claud Kelp, MD    Allergies    Sulfa antibiotics  Review of Systems   Review of Systems  Constitutional: Positive for chills and fatigue. Negative for fever.  HENT: Negative.   Respiratory: Negative for cough and shortness of breath.   Cardiovascular: Negative for chest pain.  Gastrointestinal: Positive for abdominal pain. Negative for diarrhea, nausea and vomiting.  Genitourinary: Positive for dysuria, flank pain and vaginal discharge. Negative for frequency, hematuria and vaginal bleeding.  Musculoskeletal: Positive for back pain.  Skin: Negative for color change and rash.  Neurological: Negative for dizziness, syncope and light-headedness.    Physical Exam Updated Vital Signs BP 108/71   Pulse 74   Temp 97.8 F (36.6 C) (Oral)   Resp 16   Ht 5' 4.5" (1.638 m)   Wt 108.9 kg   LMP 03/12/2019   SpO2 99%   BMI 40.56 kg/m   Physical Exam Vitals and nursing note reviewed. Exam conducted with a chaperone present.  Constitutional:      General: She is not in acute distress.    Appearance: Normal appearance. She is well-developed. She is not ill-appearing or diaphoretic.  HENT:     Head: Normocephalic and atraumatic.  Eyes:     General:        Right eye: No discharge.        Left eye: No discharge.  Cardiovascular:     Rate and Rhythm: Normal rate and regular rhythm.     Heart sounds: Normal heart sounds.  Pulmonary:     Effort: Pulmonary effort is normal. No respiratory distress.     Breath sounds: Normal breath sounds. No wheezing or rales.     Comments: Respirations equal and unlabored, patient able to speak in full sentences, lungs clear to auscultation bilaterally Abdominal:     General: Bowel sounds are normal. There is no distension.     Palpations: Abdomen is soft. There is no mass.     Tenderness: There is abdominal tenderness. There is no guarding.     Comments:  Abdomen is soft, nondistended, bowel sounds present throughout, there is tenderness to palpation across the lower abdomen without guarding or rigidity.,  Patient reports mild CVA tenderness bilaterally  Genitourinary:    Comments: Chaperone present during pelvic exam. No external genital lesions  noted, some erythema of the vulva and irritation noted. On speculum exam there is copious white liquid discharge noted with erythema of the vaginal walls and cervix. On bimanual exam patient has discomfort throughout reports some pain with cervical motion and tenderness over bilateral adnexa without palpable masses. Musculoskeletal:        General: No deformity.     Cervical back: Neck supple.  Skin:    General: Skin is warm and dry.     Capillary Refill: Capillary refill takes less than 2 seconds.  Neurological:     Mental Status: She is alert.     Coordination: Coordination normal.     Comments: Speech is clear, able to follow commands Moves extremities without ataxia, coordination intact  Psychiatric:        Mood and Affect: Mood normal.        Behavior: Behavior normal.     ED Results / Procedures / Treatments   Labs (all labs ordered are listed, but only abnormal results are displayed) Labs Reviewed  WET PREP, GENITAL - Abnormal; Notable for the following components:      Result Value   Yeast Wet Prep HPF POC PRESENT (*)    WBC, Wet Prep HPF POC MANY (*)    All other components within normal limits  URINALYSIS, ROUTINE W REFLEX MICROSCOPIC - Abnormal; Notable for the following components:   APPearance CLOUDY (*)    Specific Gravity, Urine >1.030 (*)    Hgb urine dipstick SMALL (*)    Leukocytes,Ua SMALL (*)    All other components within normal limits  COMPREHENSIVE METABOLIC PANEL - Abnormal; Notable for the following components:   Glucose, Bld 127 (*)    Calcium 8.7 (*)    AST 14 (*)    All other components within normal limits  URINALYSIS, MICROSCOPIC (REFLEX) - Abnormal;  Notable for the following components:   Bacteria, UA MANY (*)    All other components within normal limits  URINE CULTURE  PREGNANCY, URINE  HIV ANTIBODY (ROUTINE TESTING W REFLEX)  CBC WITH DIFFERENTIAL/PLATELET  RPR  GC/CHLAMYDIA PROBE AMP ([place]) NOT AT [facility]    EKG None  Radiology US Renal  Result Date: 03/28/2019 CLINICAL DATA:  Flank pain EXAM: RENAL / URINARY TRACT ULTRASOUND COMPLETE COMPARISON:  February 16, 2012 FINDINGS: Right Kidney: Renal measurements11.5 x 4.6 x 5.1 cm: = volume: 141.3 mL . Echogenicity and renal cortical thickness are within normal limits. No mass, perinephric fluid, or hydronephrosis visualized. No sonographically demonstrable calculus or ureterectasis. Left Kidney: Renal measurements: 11.6 x 4.8 x 5.2 cm = volume: 150.3 mL. Echogenicity and renal cortical thickness are within normal limits. No mass, perinephric fluid, or hydronephrosis visualized. No sonographically demonstrable calculus or ureterectasis. Bladder: Urinary bladder wall appears thickened. Other: None. IMPRESSION: Apparent urinary bladder wall thickening. Suspect a degree of cystitis. Normal appearing kidneys bilaterally. Electronically Signed   By: Bretta Bang III M.D.   On: 03/28/2019 12:05   US PELVIC COMPLETE WITH TRANSVAGINAL  Result Date: 03/28/2019 CLINICAL DATA:  Bilateral flank pain with watery vaginal discharge for 2 weeks. EXAM: TRANSABDOMINAL AND TRANSVAGINAL ULTRASOUND OF PELVIS TECHNIQUE: Both transabdominal and transvaginal ultrasound examinations of the pelvis were performed. Transabdominal technique was performed for global imaging of the pelvis including uterus, ovaries, adnexal regions, and pelvic cul-de-sac. It was necessary to proceed with endovaginal exam following the transabdominal exam to visualize the endometrium and ovaries. COMPARISON:  None FINDINGS: Uterus Measurements: 11.2 x 7 x 8.7 cm = volume: 355.5  mL. A few nabothian cysts are seen in the cervix. The  patient may have a septated or bicornuate uterus. Endometrium Thickness: 8 mm.  Minimal fluid is seen in the cervical canal. Right ovary Measurements: 4.3 x 2.3 x 2.8 cm = volume: 14.3 mL. Probable 2.5 cm hemorrhagic cysts in the right ovary. Left ovary Not visualized. Other findings No abnormal free fluid. IMPRESSION: 1. A rounded hypoechoic region is seen in the right ovary, favored to represent a hemorrhagic cyst. Recommend a 6-12 week follow-up ultrasound to ensure resolution. 2. Nabothian cysts in the cervix. 3. Question of a septated or bicornuate uterus. 4. Minimal fluid in the cervical canal, nonspecific. 5. The left ovary was not visualized. Electronically Signed   By: Gerome Sam III M.D   On: 03/28/2019 12:17    Procedures Procedures (including critical care time)  Medications Ordered in ED Medications  ondansetron [facility]) injection 4 mg (4 mg Intravenous Given 03/28/19 1111)  sodium chloride 0.9 % bolus 1,000 mL ( Intravenous Stopped 03/28/19 1256)  morphine 4 MG/ML injection 4 mg (4 mg Intravenous Given 03/28/19 1115)  cefTRIAXone (ROCEPHIN) injection 500 mg (500 mg Intramuscular Given 03/28/19 1308)  doxycycline (VIBRA-TABS) tablet 100 mg (100 mg Oral Given 03/28/19 1305)  ciprofloxacin (CIPRO) tablet 500 mg (500 mg Oral Given 03/28/19 1305)  fluconazole (DIFLUCAN) tablet 150 mg (150 mg Oral Given 03/28/19 1306)    ED Course  I have reviewed the triage vital signs and the nursing notes.  Pertinent labs & imaging results that were available during my care of the patient were reviewed by me and considered in my medical decision making (see chart for details).  Clinical Course as of Mar 28 757  Wynelle Link Mar 28, 2019  1105 No leukocytosis and normal hemoglobin  CBC with Differential [KF]  1110 Urinalysis with trace leukocytes, many bacteria present with some white and red blood cells, 11-20 squamous cells noted suggesting some degree of contamination, budding yeast present as well.   Question whether this is UTI versus coming from copious vaginal discharge noted on exam.  Urine culture sent.  Urinalysis, Microscopic (reflex)(!) [KF]  1111 Negative pregnancy  Pregnancy, urine [KF]  1111 Wet prep with many WBCs and yeast present, wet prep concerning for infection, she was recently treated with Flagyl for suspected BV does not have any clue cells present but copious discharge on exam and lower abdominal pain concerning for PID.  GC/chlamydia, HIV and RPR pending  Wet prep, genital(!) [KF]  1120 Glucose of 127, no other significant electrolyte derangements, normal renal and liver function  Comprehensive metabolic panel(!) [KF]  1130 Given findings concerning for PID with copious discharge and many WBCs on wet prep but also findings suggestive of urinary tract infection with associated flank pain will get pelvic ultrasound to rule out TOA, will also get renal ultrasound to look for hydronephrosis that would suggest infected stone, given bilateral flank pain I feel this is less likely and do not think that emergent CT imaging is indicated at this time.   [KF]  1205 Renal ultrasound shows bladder thickening suggestive of cystitis but no evidence of hydronephrosis to suggest kidney stone.  Kidneys appear normal bilaterally.  US Renal [KF]  1217 Pelvic ultrasound shows a rounded hypoechoic region in the ovary favored to be a hemorrhagic cyst, this should resolve with menstrual cycle, some nabothian cyst noted in the cervix, question of a septated or bicornuate uterus, minimal fluid noted in the cervical canal which is nonspecific.  Left  ovary nonvisualized.  No evidence of TOA or complication of PID.  Patient's pain is not suggestive of ovarian torsion.  US PELVIC COMPLETE WITH TRANSVAGINAL [KF]  5009 Given work-up and imaging will treat patient with antibiotics for both PID and UTI given that she just completed a course of Keflex with no improvement in her urinary symptoms will treat with  Cipro which should cover for potential Pyelo given flank pain.  We will also treat with Rocephin and doxycycline for PID.   [KF]    Clinical Course User Index [KF] Janet Berlin   MDM Rules/Calculators/A&P                      34 year old female presents with persistent pelvic and abdominal pain over the past week despite being treated for BV and urinary tract infection with Flagyl and Keflex.  She states pain intermittently radiates to bilateral flanks she has not had fevers or vomiting.  She reports continued white vaginal discharge.  On arrival she has normal vitals and is in no distress.  Abdomen with mild tenderness across the lower, as well as tenderness across the low back.  Mild CVA tenderness bilaterally.  Lab work is reassuring, but does show UTI, culture is pending, wet prep also with many WBCs concerning for infection, no clue cells to suggest BV, she also has a yeast present.  Imaging shows no evidence of kidney stone or TOA, will treat for PID as well as UTI with Rocephin, doxycycline, Cipro, patient treated with Diflucan for yeast infection as well.  Stressed the importance of close follow-up with her OB/GYN.  Return precautions discussed.  Patient expresses understanding and agreement.  Discharged home in good condition.  Final Clinical Impression(s) / ED Diagnoses Final diagnoses:  PID (acute pelvic inflammatory disease)  Pyelonephritis  Flank pain  Lower abdominal pain  Right ovarian cyst    Rx / DC Orders ED Discharge Orders         Ordered    ciprofloxacin (CIPRO) 500 MG tablet  2 times daily     03/28/19 1346    doxycycline (VIBRAMYCIN) 100 MG capsule  2 times daily     03/28/19 1346    ondansetron (ZOFRAN) 4 MG tablet  Every 6 hours     03/28/19 1346    HYDROcodone-acetaminophen (NORCO) 5-325 MG tablet  Every 6 hours PRN     03/28/19 1346           Jacqlyn Larsen, [place] 03/29/19 0800    Sherwood Gambler, MD 03/29/19 1003

## 2019-03-28 NOTE — ED Triage Notes (Signed)
Pt sts she was seen at [facility] about 1 wk ago;dx with UTI, kidney stone and BV; sts she is not any better after finishing meds

## 2019-03-29 LAB — URINE CULTURE

## 2019-03-29 LAB — RPR: RPR Ser Ql: NONREACTIVE

## 2019-03-30 LAB — GC/CHLAMYDIA PROBE AMP (~~LOC~~) NOT AT ARMC
Chlamydia: NEGATIVE
Neisseria Gonorrhea: NEGATIVE

## 2019-05-07 ENCOUNTER — Ambulatory Visit (INDEPENDENT_AMBULATORY_CARE_PROVIDER_SITE_OTHER): Payer: No Typology Code available for payment source | Admitting: Clinical

## 2019-05-07 ENCOUNTER — Other Ambulatory Visit: Payer: Self-pay

## 2019-05-07 ENCOUNTER — Encounter: Payer: Self-pay | Admitting: Family Medicine

## 2019-05-07 ENCOUNTER — Ambulatory Visit (INDEPENDENT_AMBULATORY_CARE_PROVIDER_SITE_OTHER): Payer: Medicaid Other | Admitting: Family Medicine

## 2019-05-07 VITALS — BP 130/78 | HR 89 | Wt 266.1 lb

## 2019-05-07 DIAGNOSIS — E669 Obesity, unspecified: Secondary | ICD-10-CM | POA: Insufficient documentation

## 2019-05-07 DIAGNOSIS — R102 Pelvic and perineal pain: Secondary | ICD-10-CM | POA: Diagnosis present

## 2019-05-07 DIAGNOSIS — Z6841 Body Mass Index (BMI) 40.0 and over, adult: Secondary | ICD-10-CM

## 2019-05-07 DIAGNOSIS — F419 Anxiety disorder, unspecified: Secondary | ICD-10-CM | POA: Diagnosis not present

## 2019-05-07 DIAGNOSIS — N83201 Unspecified ovarian cyst, right side: Secondary | ICD-10-CM | POA: Diagnosis not present

## 2019-05-07 DIAGNOSIS — F4322 Adjustment disorder with anxiety: Secondary | ICD-10-CM

## 2019-05-07 MED ORDER — IBUPROFEN 800 MG PO TABS: 800.0000 mg | ORAL_TABLET | Freq: Three times a day (TID) | ORAL | 3 refills | Status: DC | PRN

## 2019-05-07 NOTE — Patient Instructions (Signed)

## 2019-05-07 NOTE — Progress Notes (Signed)
   GYNECOLOGY PROBLEM  VISIT ENCOUNTER NOTE  Subjective:   Mariah Weaver is a 34 y.o. [id number] female here for a a problem GYN visit.  Current complaints  Early Jan- at [facility] Urgent care-  Dx BV and put on metronidazole [facility]- [facility] seen for pelvic pain- treated for PID and told she had cyst.   Got Rocephin + doxycycline Feb 11- went to [facility], blood work WNL, urine OK, kidneys OK. Negative UPT. They thought maybe abdominal migraines? They gave her medications (bentyl, Protonix, Zofran and fluconazole) and these have not helped. Pain has been unchanged by any treatments in the the past 1 month ago.  [facility] set up CT scan 05/26/19  Pain present for 1 month. Bilateral pain wraps around the back and comes the front and feels like contraction/active labor. Has hernia repair in July 2021. The pain is just "not right". Never had before.    Report some soft and some formed stools. Denies constipation. Reports history of abdominal pain in times of stress.  Denies abnormal vaginal bleeding, discharge, pelvic pain, problems with intercourse or other gynecologic concerns.    Gynecologic History Patient's last menstrual period was 05/04/2019 (exact date). Contraception: tubal ligation  Health Maintenance Due  Topic Date Due  . URINE MICROALBUMIN  11/27/1993  . PAP SMEAR-Modifier  04/10/2018  . INFLUENZA VACCINE  10/17/2018     The following portions of the patient's history were reviewed and updated as appropriate: allergies, current medications, past family history, past medical history, past social history, past surgical history and problem list.  Review of Systems Pertinent items are noted in HPI.   Objective:  BP 130/78   Pulse 89   Wt 266 lb 1.6 oz (120.7 kg)   LMP 05/04/2019 (Exact Date)   BMI 44.97 kg/m  Gen: well appearing, NAD HEENT: no scleral icterus CV: RR Lung: Normal WOB Ext: warm well perfused  PELVIC: Normal appearing external genitalia; normal appearing  vaginal mucosa and cervix.  No abnormal discharge noted.   Normal uterine size, no other palpable masses, no uterine or adnexal tenderness.   Assessment and Plan:   1. Anxiety - Ambulatory referral to Integrated Behavioral Health  2. Pelvic pain Reviewed results in care everywhere and [facility] Discussed and reviewed all results with patient Discussed multifactorial nature of pelvic pain. At this point has received appropriate treatment for PID treatment and infection is low likelihood.  Pelvic US recently showed small 2.5 cm right ovarian cyst which might contribute. ddx also includes possible IBS or adhesive disease from prior 2 CS.  - Will get follow [place] for cyst and reviewed torsion precautions.  - recommended continued follow up with PCP for other etiologies - recommended scheduled NSAIDS for 4 days only to see if this alleviates pain - recommended and provided educational handout about low FODMAPdiet as patient endorsed that she often has abdominal pain in times of stress. Perhaps IBS is higher on ddx.  - US PELVIS (TRANSABDOMINAL ONLY); Future  3. Class 3 severe obesity due to excess calories without serious comorbidity with body mass index (BMI) of 40.0 to 44.9 in adult [facility])   Please refer to After Visit Summary for other counseling recommendations.   Return in about 3 months (around 08/04/2019), or if symptoms worsen or fail to improve.  Federico Flake, MD, MPH, ABFM Attending Physician Faculty Practice- [facility]

## 2019-05-07 NOTE — BH Specialist Note (Signed)
Integrated Behavioral Health Initial Visit  MRN: [id number] Name: Mariah Weaver  Number of Integrated Behavioral Health Clinician visits:: 1/6 Session Start time: 10:47  Session End time: 11:00 Total time: 13  Type of Service: Integrated Behavioral Health- Individual/Family Interpretor:No. Interpretor Name and Language: N/A   Warm Hand Off Completed.       SUBJECTIVE: Mariah Weaver is a 34 y.o. female accompanied by N/A Patient was referred by Lyndel Safe, MD for anxiety. Patient reports the following symptoms/concerns: Pt primary concerns today were anxiety related. Pt discussed different areas that she believes to be carrying stress; abdomen, facial blemishes, "red spots" and dryness of skin on the face.  Duration of problem: Ongoing; Severity of problem: moderate  OBJECTIVE: Mood: Anxious and Affect: Appropriate Risk of harm to self or others: No plan to harm self or others  LIFE CONTEXT: Family and Social: Husband and two children School/Work: N/A Self-Care: N/A Life Changes: Last pregnancy Pt made aware that future conception may be difficult  GOALS ADDRESSED: Patient will: 1. Reduce symptoms of: anxiety 2. Increase knowledge and/or ability of: stress reduction  3. Demonstrate ability to: Increase healthy adjustment to current life circumstances  INTERVENTIONS: Interventions utilized: Psychoeducation and/or Health Education  Standardized Assessments completed: GAD-7 and PHQ 9  ASSESSMENT: Patient currently experiencing anxiety.   Patient may benefit from Psychoeducation and/or Health Education as it relates to anxiety.  PLAN: 1. Follow up with behavioral health clinician on : Two weeks 2. Behavioral recommendations:  -Set up f/u visit with [facility] -Consider apps as self-coping (on After Visit Summary) 3. Referral(s): Integrated Behavioral Health Services (In Clinic) 4. "From scale of 1-10, how likely are you to follow plan?": N/A  Rae Lips,  LCSW  Depression screen [facility] 2/9 05/07/2019 04/23/2018 02/04/2018 01/07/2018  Decreased Interest 1 1 1 1   Down, Depressed, Hopeless 2 0 0 0  PHQ - 2 Score 3 1 1 1   Altered sleeping 0 0 1 0  Tired, decreased energy 2 2 2 2   Change in appetite 0 0 1 3  Feeling bad or failure about yourself  1 1 0 0  Trouble concentrating 0 1 0 0  Moving slowly or fidgety/restless 2 2 0 0  Suicidal thoughts 0 0 0 0  PHQ-9 Score 8 7 5 6    GAD 7 : Generalized Anxiety Score 05/07/2019 04/23/2018 02/04/2018 01/07/2018  Nervous, Anxious, on Edge 2 1 1 1   Control/stop worrying 2 1 1 1   Worry too much - different things 2 1 1 1   Trouble relaxing 1 1 1 2   Restless 2 1 1  -  Easily annoyed or irritable 2 1 1  -  Afraid - awful might happen 1 0 1 -  Total GAD 7 Score 12 6 7  -

## 2019-05-07 NOTE — Patient Instructions (Signed)
Low-FODMAP Eating Plan  FODMAPs (fermentable oligosaccharides, disaccharides, monosaccharides, and polyols) are sugars that are hard for some people to digest. A low-FODMAP eating plan may help some people who have bowel (intestinal) diseases to manage their symptoms. This meal plan can be complicated to follow. Work with a diet and nutrition specialist (dietitian) to make a low-FODMAP eating plan that is right for you. A dietitian can make sure that you get enough nutrition from this diet. What are tips for following this plan? Reading food labels  Check labels for hidden FODMAPs such as: ? High-fructose syrup. ? Honey. ? Agave. ? Natural fruit flavors. ? Onion or garlic powder.  Choose low-FODMAP foods that contain 3-4 grams of fiber per serving.  Check food labels for serving sizes. Eat only one serving at a time to make sure FODMAP levels stay low. Meal planning  Follow a low-FODMAP eating plan for up to 6 weeks, or as told by your health care provider or dietitian.  To follow the eating plan: 1. Eliminate high-FODMAP foods from your diet completely. 2. Gradually reintroduce high-FODMAP foods into your diet one at a time. Most people should wait a few days after introducing one high-FODMAP food before they introduce the next high-FODMAP food. Your dietitian can recommend how quickly you may reintroduce foods. 3. Keep a daily record of what you eat and drink, and make note of any symptoms that you have after eating. 4. Review your daily record with a dietitian regularly. Your dietitian can help you identify which foods you can eat and which foods you should avoid. General tips  Drink enough fluid each day to keep your urine pale yellow.  Avoid processed foods. These often have added sugar and may be high in FODMAPs.  Avoid most dairy products, whole grains, and sweeteners.  Work with a dietitian to make sure you get enough fiber in your diet. Recommended  foods Grains  Gluten-free grains, such as rice, oats, buckwheat, quinoa, corn, polenta, and millet. Gluten-free pasta, bread, or cereal. Rice noodles. Corn tortillas. Vegetables  Eggplant, zucchini, cucumber, peppers, green beans, Brussels sprouts, bean sprouts, lettuce, arugula, kale, Swiss chard, spinach, collard greens, bok choy, summer squash, potato, and tomato. Limited amounts of corn, carrot, and sweet potato. Green parts of scallions. Fruits  Bananas, oranges, lemons, limes, blueberries, raspberries, strawberries, grapes, cantaloupe, honeydew melon, kiwi, papaya, passion fruit, and pineapple. Limited amounts of dried cranberries, banana chips, and shredded coconut. Dairy  Lactose-free milk, yogurt, and kefir. Lactose-free cottage cheese and ice cream. Non-dairy milks, such as almond, coconut, hemp, and rice milk. Yogurts made of non-dairy milks. Limited amounts of goat cheese, brie, mozzarella, parmesan, swiss, and other hard cheeses. Meats and other protein foods  Unseasoned beef, pork, poultry, or fish. Eggs. Bacon. Tofu (firm) and tempeh. Limited amounts of nuts and seeds, such as almonds, walnuts, brazil nuts, pecans, peanuts, pumpkin seeds, chia seeds, and sunflower seeds. Fats and oils  Butter-free spreads. Vegetable oils, such as olive, canola, and sunflower oil. Seasoning and other foods  Artificial sweeteners with names that do not end in "ol" such as aspartame, saccharine, and stevia. Maple syrup, white table sugar, raw sugar, brown sugar, and molasses. Fresh basil, coriander, parsley, rosemary, and thyme. Beverages  Water and mineral water. Sugar-sweetened soft drinks. Small amounts of orange juice or cranberry juice. Black and green tea. Most dry wines. Coffee. This may not be a complete list of low-FODMAP foods. Talk with your dietitian for more information. Foods to avoid Grains  Wheat,   including kamut, durum, and semolina. Barley and bulgur. Couscous. Wheat-based  cereals. Wheat noodles, bread, crackers, and pastries. Vegetables  Chicory root, artichoke, asparagus, cabbage, snow peas, sugar snap peas, mushrooms, and cauliflower. Onions, garlic, leeks, and the white part of scallions. Fruits  Fresh, dried, and juiced forms of apple, pear, watermelon, peach, plum, cherries, apricots, blackberries, boysenberries, figs, nectarines, and mango. Avocado. Dairy  Milk, yogurt, ice cream, and soft cheese. Cream and sour cream. Milk-based sauces. Custard. Meats and other protein foods  Fried or fatty meat. Sausage. Cashews and pistachios. Soybeans, baked beans, black beans, chickpeas, kidney beans, fava beans, navy beans, lentils, and split peas. Seasoning and other foods  Any sugar-free gum or candy. Foods that contain artificial sweeteners such as sorbitol, mannitol, isomalt, or xylitol. Foods that contain honey, high-fructose corn syrup, or agave. Bouillon, vegetable stock, beef stock, and chicken stock. Garlic and onion powder. Condiments made with onion, such as hummus, chutney, pickles, relish, salad dressing, and salsa. Tomato paste. Beverages  Chicory-based drinks. Coffee substitutes. Chamomile tea. Fennel tea. Sweet or fortified wines such as port or sherry. Diet soft drinks made with isomalt, mannitol, maltitol, sorbitol, or xylitol. Apple, pear, and mango juice. Juices with high-fructose corn syrup. This may not be a complete list of high-FODMAP foods. Talk with your dietitian to discuss what dietary choices are best for you.  Summary  A low-FODMAP eating plan is a short-term diet that eliminates FODMAPs from your diet to help ease symptoms of certain bowel diseases.  The eating plan usually lasts up to 6 weeks. After that, high-FODMAP foods are restarted gradually, one at a time, so you can find out which may be causing symptoms.  A low-FODMAP eating plan can be complicated. It is best to work with a dietitian who has experience with this type of  plan. This information is not intended to replace advice given to you by your health care provider. Make sure you discuss any questions you have with your health care provider. Document Revised: 02/14/2017 Document Reviewed: 10/29/2016 Elsevier Patient Education  2020 Elsevier Inc.  

## 2019-05-18 ENCOUNTER — Ambulatory Visit (INDEPENDENT_AMBULATORY_CARE_PROVIDER_SITE_OTHER): Payer: No Typology Code available for payment source | Admitting: Clinical

## 2019-05-18 DIAGNOSIS — F4322 Adjustment disorder with anxiety: Secondary | ICD-10-CM

## 2019-05-18 NOTE — BH Specialist Note (Signed)
Integrated Behavioral Health via Telemedicine Video Visit  05/18/2019 Mariah Weaver [id number]  Number of Integrated Behavioral Health visits: 2(3 total) Session Start time: 3:00  Session End time: 4:02 Total time: 72  Referring Provider: Federico Flake, MD Type of Visit: Video Patient/Family location: Home [facility] Provider location: [facility] All persons participating in visit: Patient Mariah Weaver and [facility] Fortunato Nordin    Confirmed patient's address: Yes  Confirmed patient's phone number: Yes  Any changes to demographics: No   Confirmed patient's insurance: Yes  Any changes to patient's insurance: No   Discussed confidentiality: Yes   I connected with Mariah Weaver by a video enabled telemedicine application and verified that I am speaking with the correct person using two identifiers.     I discussed the limitations of evaluation and management by telemedicine and the availability of in person appointments.  I discussed that the purpose of this visit is to provide behavioral health care while limiting exposure to the novel coronavirus.   Discussed there is a possibility of technology failure and discussed alternative modes of communication if that failure occurs.  I discussed that engaging in this video visit, they consent to the provision of behavioral healthcare and the services will be billed under their insurance.  Patient and/or legal guardian expressed understanding and consented to video visit: Yes   PRESENTING CONCERNS: Patient and/or family reports the following symptoms/concerns: Pt states her primary concern today is feeling overwhelmed, anxious, and first panic attack since college this morning, attributed to caring for physically aggressive 7yo son experiencing autism during a pandemic (schools/programs closed due to covid), and 29mo daughter. Pt feeling a sense of loss at not being able to have more children, and has been an emotionally difficult adjustment.   Duration of problem: Postpartum; Severity of problem: Moderately severe  STRENGTHS (Protective Factors/Coping Skills): Supportive friends/family; hopeful outlook  GOALS ADDRESSED: Patient will: 1.  Reduce symptoms of: anxiety, depression and stress  2.  Increase knowledge and/or ability of: healthy habits and self-management skills  3.  Demonstrate ability to: Increase healthy adjustment to current life circumstances  INTERVENTIONS: Interventions utilized:  Mindfulness or Management consultant and Psychoeducation and/or Health Education Standardized Assessments completed: PHQ9/GAD7 taken in past two weeks  ASSESSMENT: Patient currently experiencing Adjustment disorder with anxiety  Patient may benefit from psychoeducation and brief therapeutic interventions regarding coping with symptoms of anxiety and life stress   PLAN: 1. Follow up with behavioral health clinician on : Two weeks 2. Behavioral recommendations:  -Consider joining autism support group (ask friend at autism center) as soon as able -Continue using self-coping strategies that have helped in the past (deep breathing and daily walks) for at least 5 minutes daily -Consider apps discussed as additional self-coping strategy -Continue using Insta[employer] nightly 3. Referral(s): [facility] [employer] (In Clinic)  I discussed the assessment and treatment plan with the patient and/or parent/guardian. They were provided an opportunity to ask questions and all were answered. They agreed with the plan and demonstrated an understanding of the instructions.   They were advised to call back or seek an in-person evaluation if the symptoms worsen or if the condition fails to improve as anticipated.  [name]   Depression screen [facility] 2/9 05/07/2019 04/23/2018 02/04/2018 01/07/2018  Decreased Interest 1 1 1 1   Down, Depressed, Hopeless 2 0 0 0  PHQ - 2 Score 3 1 1 1   Altered sleeping 0 0 1 0  Tired, decreased  energy 2 2 2  2  Change in appetite 0 0 1 3  Feeling bad or failure about yourself  1 1 0 0  Trouble concentrating 0 1 0 0  Moving slowly or fidgety/restless 2 2 0 0  Suicidal thoughts 0 0 0 0  PHQ-9 Score 8 7 5 6    GAD 7 : Generalized Anxiety Score 05/07/2019 04/23/2018 02/04/2018 01/07/2018  Nervous, Anxious, on Edge 2 1 1 1   Control/stop worrying 2 1 1 1   Worry too much - different things 2 1 1 1   Trouble relaxing 1 1 1 2   Restless 2 1 1  -  Easily annoyed or irritable 2 1 1  -  Afraid - awful might happen 1 0 1 -  Total GAD 7 Score 12 6 7  -

## 2019-05-18 NOTE — Patient Instructions (Signed)
Coping with Panic Attacks   What is a panic attack?  You may have had a panic attack if you experienced four or more of the symptoms listed below coming on abruptly and peaking in about 10 minutes.  Panic Symptoms   . Pounding heart  . Sweating  . Trembling or shaking  . Shortness of breath  . Feeling of choking  . Chest pain  . Nausea or abdominal distress    . Feeling dizzy, unsteady, lightheaded, or faint  . Feelings of unreality or being detached from yourself  . Fear of losing control or going crazy  . Fear of dying  . Numbness or tingling  . Chills or hot flashes      Panic attacks are sometimes accompanied by avoidance of certain places or situations. These are often situations that would be difficult to escape from or in which help might not be available. Examples might include crowded shopping malls, public transportation, restaurants, or driving.   Why do panic attacks occur?   Panic attacks are the body's alarm system gone awry. All of us have a built-in alarm system, powered by adrenaline, which increases our heart rate, breathing, and blood flow in response to danger. Ordinarily, this 'danger response system' works well. In some people, however, the response is either out of proportion to whatever stress is going on, or may come out of the blue without any stress at all.   For example, if you are walking in the woods and see a bear coming your way, a variety of changes occur in your body to prepare you to either fight the danger or flee from the situation. Your heart rate will increase to get more blood flow around your body, your breathing rate will quicken so that more oxygen is available, and your muscles will tighten in order to be ready to fight or run. You may feel nauseated as blood flow leaves your stomach area and moves into your limbs. These bodily changes are all essential to helping you survive the dangerous situation. After the danger has passed, your body  functions will begin to go back to normal. This is because your body also has a system for "recovering" by bringing your body back down to a normal state when the danger is over.   As you can see, the emergency response system is adaptive when there is, in fact, a "true" or "real" danger (e.g., bear). However, sometimes people find that their emergency response system is triggered in "everyday" situations where there really is no true physical danger (e.g., in a meeting, in the grocery store, while driving in normal traffic, etc.).   What triggers a panic attack?  Sometimes particularly stressful situations can trigger a panic attack. For example, an argument with your spouse or stressors at work can cause a stress response (activating the emergency response system) because you perceive it as threatening or overwhelming, even if there is no direct risk to your survival.  Sometimes panic attacks don't seem to be triggered by anything in particular- they may "come out of the blue". Somehow, the natural "fight or flight" emergency response system has gotten activated when there is no real danger. Why does the body go into "emergency mode" when there is no real danger?   Often, people with panic attacks are frightened or alarmed by the physical sensations of the emergency response system. First, unexpected physical sensations are experienced (tightness in your chest or some shortness of breath). This then leads to   feeling fearful or alarmed by these symptoms ("Something's wrong!", "Am I having a heart attack?", "Am I going to faint?") The mind perceives that there is a danger even though no real danger exists. This, in turn, activates the emergency response system ("fight or flight"), leading to a "full blown" panic attack. In summary, panic attacks occur when we misinterpret physical symptoms as signs of impending death, craziness, loss of control, embarrassment, or fear of fear. Sometimes you may be aware of  thoughts of danger that activate the emergency response system (for example, thinking "I'm having a heart attack" when you feel chest pressure or increased heart rate). At other times, however, you may not be aware of such thoughts. After several incidences of being afraid of physical sensations, anxiety and panic can occur in response to the initial sensations without conscious thoughts of danger. Instead, you just feel afraid or alarmed. In other words, the panic or fear may seem to occur "automatically" without you consciously telling yourself anything.   After having had one or more panic attacks, you may also become more focused on what is going on inside your body. You may scan your body and be more vigilant about noticing any symptoms that might signal the start of a panic attack. This makes it easier for panic attacks to happen again because you pick up on sensations you might otherwise not have noticed, and misinterpret them as something dangerous. A panic attack may then result.      How do I cope with panic attacks?  An important part of overcoming panic attacks involves re-interpreting your body's physical reactions and teaching yourself ways to decrease the physical arousal. This can be done through practicing the cognitive and behavioral interventions below.   Research has found that over half of people who have panic attacks show some signs of hyperventilation or overbreathing. This can produce initial sensations that alarm you and lead to a panic attack. Overbreathing can also develop as part of the panic attack and make the symptoms worse. When people hyperventilate, certain blood vessels in the body become narrower. In particular, the brain may get slightly less oxygen. This can lead to the symptoms of dizziness, confusion, and lightheadedness that often occur during panic attacks. Other parts of the body may also get a bit less oxygen, which may lead to numbness or tingling in the hands  or feet or the sensation of cold, clammy hands. It also may lead the heart to pump harder. Although these symptoms may be frightening and feel unpleasant, it is important to remember that hyperventilating is not dangerous. However, you can help overcome the unpleasantness of overbreathing by practicing Breathing Retraining.   Practice this basic technique three times a day, every day:  . Inhale. With your shoulders relaxed, inhale as slowly and deeply as you can while you count to six. If you can, use your diaphragm to fill your lungs with air.  . Hold. Keep the air in your lungs as you slowly count to four.  . Exhale. Slowly breath out as you count to six.  . Repeat. Do the inhale-hold-exhale cycle several times. Each time you do it, exhale for longer counts.  Like any new skill, Breathing Retraining requires practice. Try practicing this skill twice a day for several minutes. Initially, do not try this technique in specific situations or when you become frightened or have a panic attack. Begin by practicing in a quiet environment to build up your skill level so that you   can later use it in time of "emergency."   2. Decreasing Avoidance  Regardless of whether you can identify why you began having panic attacks or whether they seemed to come out of the blue, the places where you began having panic attacks often can become triggers themselves. It is not uncommon for individuals to begin to avoid the places where they have had panic attacks. Over time, the individual may begin to avoid more and more places, thereby decreasing their activities and often negatively impacting their quality of life. To break the cycle of avoidance, it is important to first identify the places or situations that are being avoided, and then to do some "relearning."  To begin this intervention, first create a list of locations or situations that you tend to avoid. Then choose an avoided location or situation that you would like  to target first. Now develop an "exposure hierarchy" for this situation or location. An "exposure hierarchy" is a list of actions that make you feel anxious in this situation. Order these actions from least to most anxiety-producing. It is often helpful to have the first item on your hierarchy involve thinking or imagining part of the feared/avoided situation.   Here is an example of an exposure hierarchy for decreasing avoidance of the grocery store. Note how it is ordered from the least amount of anxiety (at the top) to the most anxiety (at the bottom):  . Think about going to the grocery store alone.  . Go to the grocery store with a friend or family member.  . Go to the grocery store alone to pick up a few small items (5-10 minutes in the store).  . Shopping for 10-20 minutes in the store alone.  . Doing the shopping for the week by myself (20-30 minutes in the store).   Your homework is to "expose" yourself to the lowest item on your hierarchy and use your breathing relaxation and coping statements (see below) to help you remain in the situation. Practice this several times during the upcoming week. Once you have mastered each item with minimal anxiety, move on to the next higher action on your list.   Cognitive Interventions  1. Identify your negative self-talk Anxious thoughts can increase anxiety symptoms and panic. The first step in changing anxious thinking is to identify your own negative, alarming self-talk. Some common alarming thoughts:  . I'm having a heart attack.            . I must be going crazy. . I think I'm dying. . People will think I'm crazy. . I'm going to pass our.  . Oh no- here it comes.  . I can't stand this.  . I've got to get out of here!  2. Use positive coping statements Changing or disrupting a pattern of anxious thoughts by replacing them with more calming or supportive statements can help to divert a panic attack. Some common helpful coping statements:   . This is not an emergency.  . I don't like feeling this way, but I can accept it.  . I can feel like this and still be okay.  . This has happened before, and I was okay. I'll be okay this time, too.  . I can be anxious and still deal with this situation.   /Emotional Wellbeing Apps and Websites Here are a few free apps meant to help you to help yourself.  To find, try searching on the internet to see if the app is offered   on Apple/Android devices. If your first choice doesn't come up on your device, the good news is that there are many choices! Play around with different apps to see which ones are helpful to you.    Calm This is an app meant to help increase calm feelings. Includes info, strategies, and tools for tracking your feelings.      Calm Harm  This app is meant to help with self-harm. Provides many 5-minute or 15-min coping strategies for doing instead of hurting yourself.       Healthy Minds Health Minds is a problem-solving tool to help deal with emotions and cope with stress you encounter wherever you are.      MindShift This app can help people cope with anxiety. Rather than trying to avoid anxiety, you can make an important shift and face it.      MY3  MY3 features a support system, safety plan and resources with the goal of offering a tool to use in a time of need.       My Life My Voice  This mood journal offers a simple solution for tracking your thoughts, feelings and moods. Animated emoticons can help identify your mood.       Relax Melodies Designed to help with sleep, on this app you can mix sounds and meditations for relaxation.      Smiling Mind Smiling Mind is meditation made easy: it's a simple tool that helps put a smile on your mind.        Stop, Breathe & Think  A friendly, simple guide for people through meditations for mindfulness and compassion.  Stop, Breathe and Think Kids Enter your current feelings and choose a "mission"  to help you cope. Offers videos for certain moods instead of just sound recordings.       Team Orange The goal of this tool is to help teens change how they think, act, and react. This app helps you focus on your own good feelings and experiences.      The Virtual Hope Box The Virtual Hope Box (VHB) contains simple tools to help patients with coping, relaxation, distraction, and positive thinking.         

## 2019-05-21 ENCOUNTER — Other Ambulatory Visit: Payer: Self-pay | Admitting: Family Medicine

## 2019-05-21 DIAGNOSIS — F419 Anxiety disorder, unspecified: Secondary | ICD-10-CM

## 2019-05-21 MED ORDER — SERTRALINE HCL 25 MG PO TABS: 25.0000 mg | ORAL_TABLET | Freq: Every day | ORAL | 3 refills | Status: AC

## 2019-05-21 NOTE — Progress Notes (Signed)
Sent in RX for Zoloft upon review with integrated [facility] [employer]. Patient also has PCP that maybe able to prescribe this medication and provide dose adjustments or changes in the future.

## 2019-06-18 ENCOUNTER — Ambulatory Visit: Payer: Medicaid Other | Attending: Internal Medicine

## 2019-06-18 ENCOUNTER — Ambulatory Visit: Payer: Self-pay

## 2019-06-18 DIAGNOSIS — Z23 Encounter for immunization: Secondary | ICD-10-CM

## 2019-06-18 NOTE — Progress Notes (Signed)
   Covid-19 Vaccination Clinic  Name:  Mariah Weaver    MRN: [id number] DOB: 24-Jan-1986  06/18/2019  Mariah Weaver was observed post Covid-19 immunization for 15 minutes without incident. She was provided with Vaccine Information Sheet and instruction to access the V-Safe system.   Mariah Weaver was instructed to call 911 with any severe reactions post vaccine: Marland Kitchen Difficulty breathing  . Swelling of face and throat  . A fast heartbeat  . A bad rash all over body  . Dizziness and weakness   Immunizations Administered    Name Date Dose VIS Date Route   Pfizer COVID-19 Vaccine 06/18/2019  4:16 PM 0.3 mL 02/26/2019 Intramuscular   Manufacturer: [employer], [employer]   Lot: [id number]   NDC: [address]-2

## 2019-06-28 ENCOUNTER — Ambulatory Visit (HOSPITAL_COMMUNITY): Payer: Medicaid Other | Attending: Family Medicine

## 2019-07-06 ENCOUNTER — Telehealth: Payer: Self-pay | Admitting: Clinical

## 2019-07-06 NOTE — Telephone Encounter (Signed)
Left HIPPA-compliant message to call back Pola Furno from [facility] at [phone].   

## 2019-07-13 ENCOUNTER — Ambulatory Visit: Payer: Medicaid Other | Attending: Internal Medicine

## 2019-07-13 DIAGNOSIS — Z23 Encounter for immunization: Secondary | ICD-10-CM

## 2019-07-13 NOTE — Progress Notes (Signed)
   Covid-19 Vaccination Clinic  Name:  Mariah Weaver    MRN: [id number] DOB: January 22, 1986  07/13/2019  [name] was observed post Covid-19 immunization for 15 minutes without incident. She was provided with Vaccine Information Sheet and instruction to access the V-Safe system.   [name] was instructed to call 911 with any severe reactions post vaccine: Marland Kitchen Difficulty breathing  . Swelling of face and throat  . A fast heartbeat  . A bad rash all over body  . Dizziness and weakness   Immunizations Administered    Name Date Dose VIS Date Route   Pfizer COVID-19 Vaccine 07/13/2019 10:16 AM 0.3 mL 05/12/2018 Intramuscular   Manufacturer: [employer], [employer]   Lot: [id number]   NDC: [address]-6

## 2019-08-04 ENCOUNTER — Telehealth: Payer: Self-pay | Admitting: Clinical

## 2019-08-04 NOTE — Telephone Encounter (Signed)
Left HIPPA-compliant message to call back Asher Muir from [facility] [employer] at [facility] at [phone] (main office) or [phone] (Jamie's office), and left MyChart message for patient.

## 2019-12-31 DIAGNOSIS — M25562 Pain in left knee: Secondary | ICD-10-CM | POA: Diagnosis not present

## 2019-12-31 DIAGNOSIS — M25561 Pain in right knee: Secondary | ICD-10-CM | POA: Diagnosis not present

## 2020-02-04 DIAGNOSIS — M25561 Pain in right knee: Secondary | ICD-10-CM | POA: Diagnosis not present

## 2020-02-04 DIAGNOSIS — M25562 Pain in left knee: Secondary | ICD-10-CM | POA: Diagnosis not present

## 2020-02-08 DIAGNOSIS — M25562 Pain in left knee: Secondary | ICD-10-CM | POA: Diagnosis not present

## 2020-02-08 DIAGNOSIS — M25561 Pain in right knee: Secondary | ICD-10-CM | POA: Diagnosis not present

## 2020-02-15 DIAGNOSIS — M7051 Other bursitis of knee, right knee: Secondary | ICD-10-CM | POA: Diagnosis not present

## 2020-02-15 DIAGNOSIS — M7052 Other bursitis of knee, left knee: Secondary | ICD-10-CM | POA: Diagnosis not present

## 2020-02-16 DIAGNOSIS — M25562 Pain in left knee: Secondary | ICD-10-CM | POA: Diagnosis not present

## 2020-02-16 DIAGNOSIS — M25561 Pain in right knee: Secondary | ICD-10-CM | POA: Diagnosis not present

## 2020-02-21 DIAGNOSIS — M25561 Pain in right knee: Secondary | ICD-10-CM | POA: Diagnosis not present

## 2020-02-21 DIAGNOSIS — M25562 Pain in left knee: Secondary | ICD-10-CM | POA: Diagnosis not present

## 2020-02-24 DIAGNOSIS — M25562 Pain in left knee: Secondary | ICD-10-CM | POA: Diagnosis not present

## 2020-02-24 DIAGNOSIS — M25561 Pain in right knee: Secondary | ICD-10-CM | POA: Diagnosis not present

## 2020-02-29 DIAGNOSIS — M25562 Pain in left knee: Secondary | ICD-10-CM | POA: Diagnosis not present

## 2020-02-29 DIAGNOSIS — M25561 Pain in right knee: Secondary | ICD-10-CM | POA: Diagnosis not present

## 2020-03-02 DIAGNOSIS — M25561 Pain in right knee: Secondary | ICD-10-CM | POA: Diagnosis not present

## 2020-03-02 DIAGNOSIS — M25562 Pain in left knee: Secondary | ICD-10-CM | POA: Diagnosis not present

## 2020-03-07 DIAGNOSIS — M25562 Pain in left knee: Secondary | ICD-10-CM | POA: Diagnosis not present

## 2020-03-07 DIAGNOSIS — M25561 Pain in right knee: Secondary | ICD-10-CM | POA: Diagnosis not present

## 2020-03-16 DIAGNOSIS — M25562 Pain in left knee: Secondary | ICD-10-CM | POA: Diagnosis not present

## 2020-03-16 DIAGNOSIS — M25561 Pain in right knee: Secondary | ICD-10-CM | POA: Diagnosis not present

## 2020-03-21 DIAGNOSIS — M25561 Pain in right knee: Secondary | ICD-10-CM | POA: Diagnosis not present

## 2020-03-21 DIAGNOSIS — M25562 Pain in left knee: Secondary | ICD-10-CM | POA: Diagnosis not present

## 2020-03-23 DIAGNOSIS — M25561 Pain in right knee: Secondary | ICD-10-CM | POA: Diagnosis not present

## 2020-03-23 DIAGNOSIS — M25562 Pain in left knee: Secondary | ICD-10-CM | POA: Diagnosis not present

## 2020-03-27 DIAGNOSIS — M25562 Pain in left knee: Secondary | ICD-10-CM | POA: Diagnosis not present

## 2020-03-27 DIAGNOSIS — M25561 Pain in right knee: Secondary | ICD-10-CM | POA: Diagnosis not present

## 2020-04-06 DIAGNOSIS — M25561 Pain in right knee: Secondary | ICD-10-CM | POA: Diagnosis not present

## 2020-04-06 DIAGNOSIS — M25562 Pain in left knee: Secondary | ICD-10-CM | POA: Diagnosis not present

## 2020-04-11 DIAGNOSIS — M1711 Unilateral primary osteoarthritis, right knee: Secondary | ICD-10-CM | POA: Diagnosis not present

## 2020-04-11 DIAGNOSIS — M170 Bilateral primary osteoarthritis of knee: Secondary | ICD-10-CM | POA: Diagnosis not present

## 2020-04-18 DIAGNOSIS — M25561 Pain in right knee: Secondary | ICD-10-CM | POA: Diagnosis not present

## 2020-04-18 DIAGNOSIS — M25562 Pain in left knee: Secondary | ICD-10-CM | POA: Diagnosis not present

## 2020-05-01 DIAGNOSIS — M25561 Pain in right knee: Secondary | ICD-10-CM | POA: Diagnosis not present

## 2020-05-04 IMAGING — US US MFM OB FOLLOW UP
1 series · 14 of 26 positions shown · non-contrast
Comparison: none

[Series 1: us mfm ob follow up · 14 of 26 slices shown]
[im 1/26]
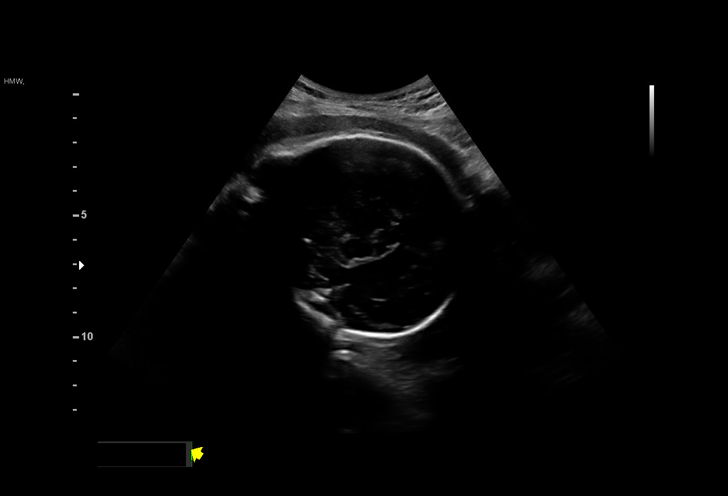
[im 3/26]
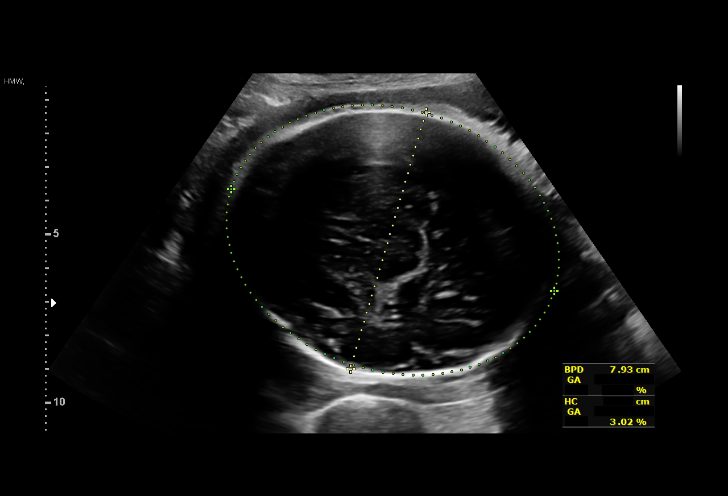
[im 5/26]
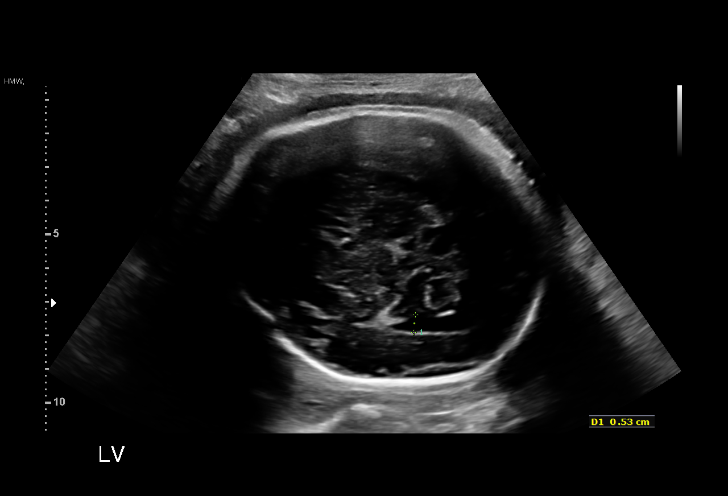
[im 7/26]
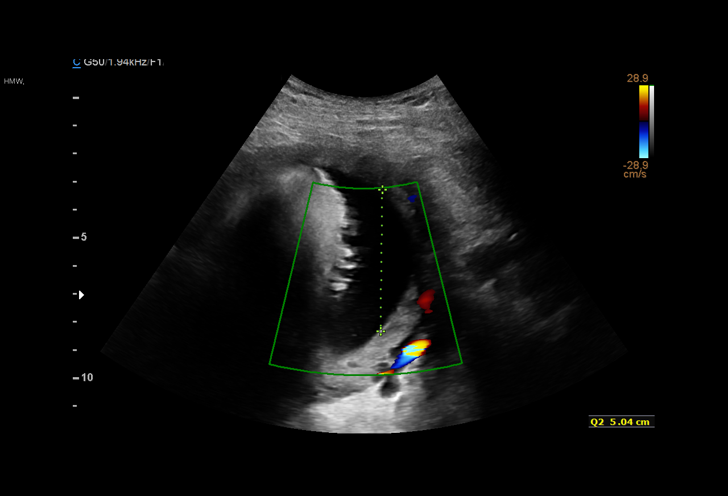
[im 9/26]
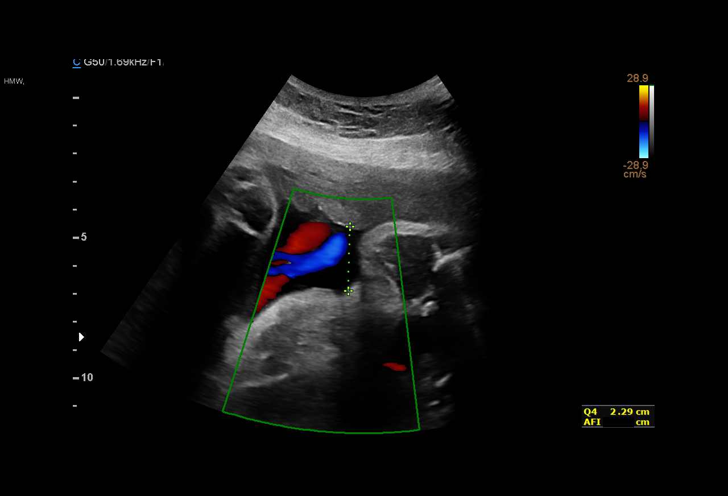
[im 11/26]
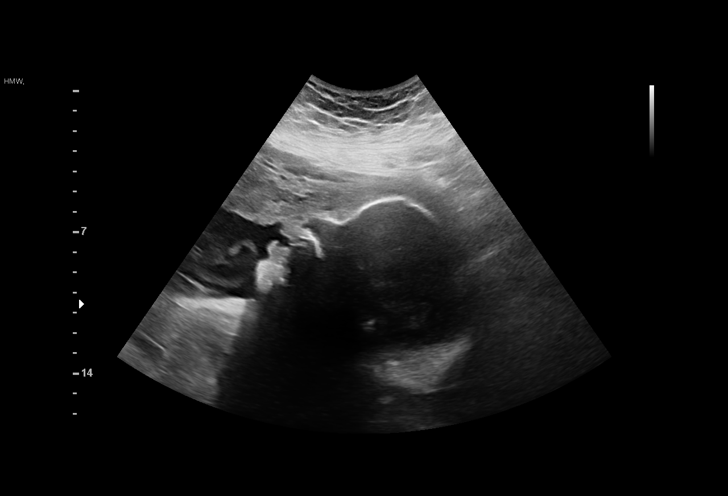
[im 13/26]
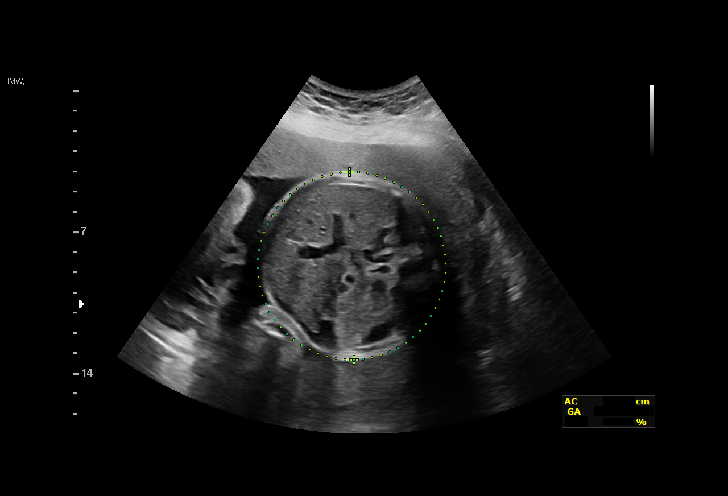
[im 14/26]
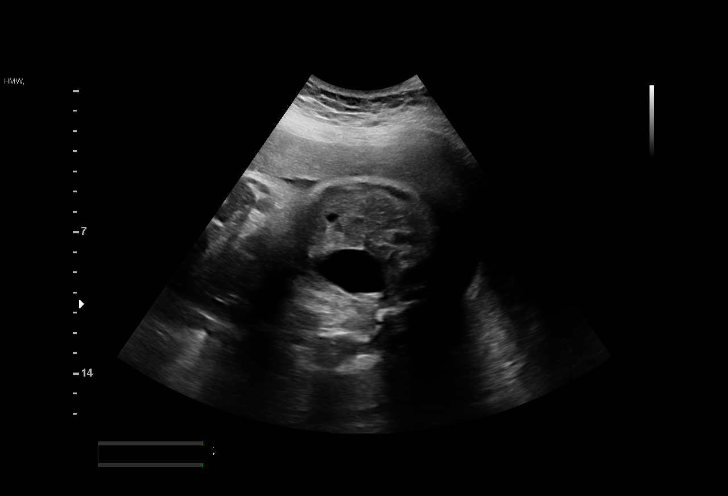
[im 16/26]
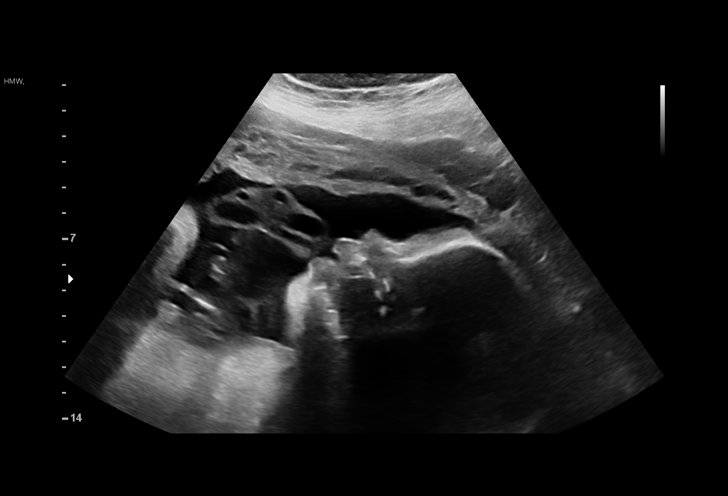
[im 18/26]
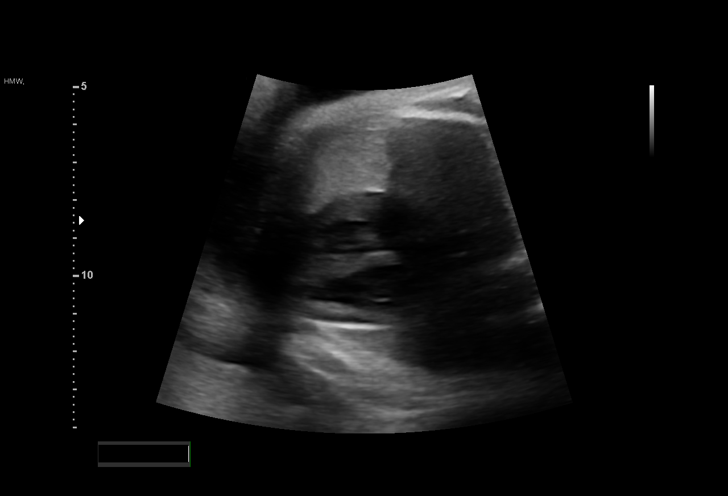
[im 20/26]
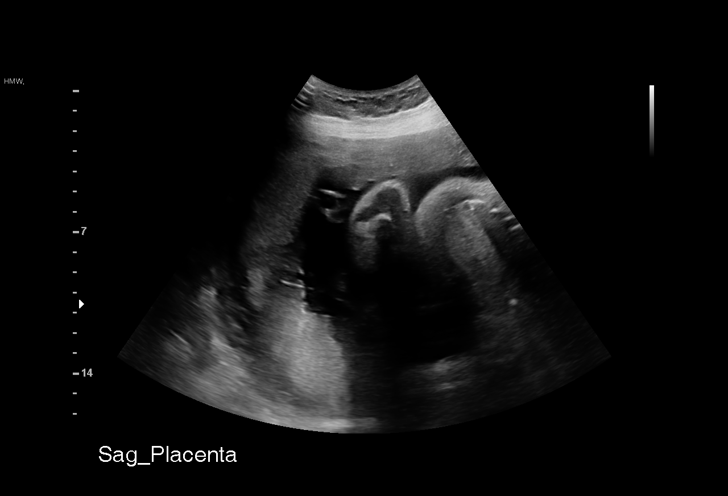
[im 22/26]
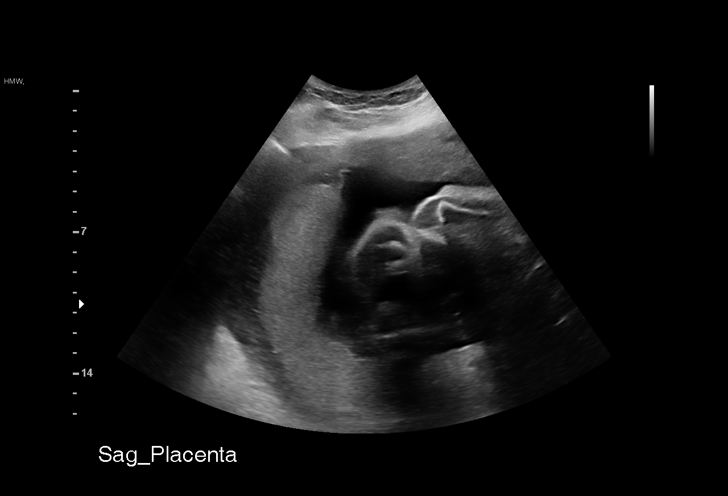
[im 24/26]
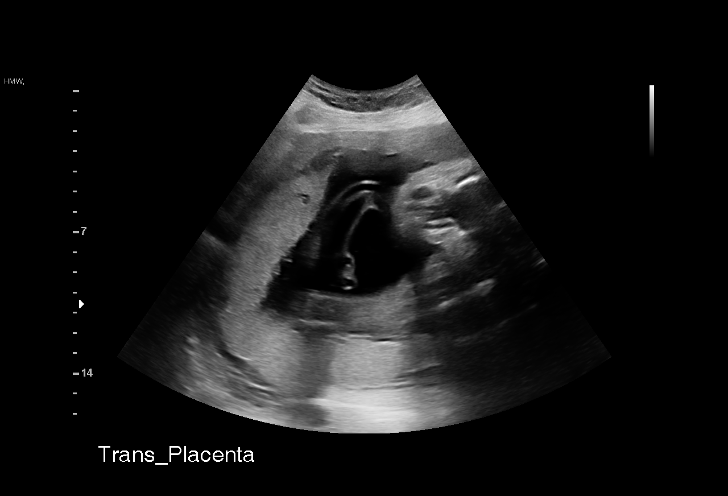
[im 26/26]
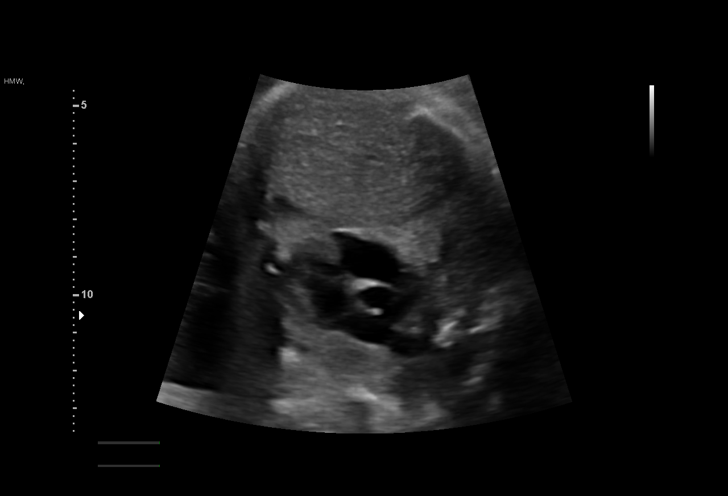

[14 of 26 positions shown; findings below may reference images not displayed]

NP

 ----------------------------------------------------------------------

 ----------------------------------------------------------------------
Indications

  History of cesarean delivery, currently
  pregnant
  Medical complication of pregnancy (maternal
  Yamilet Divas syndrome)
  Medical complication of pregnancy (maternal
  abdominal wall hernia)
  Obesity complicating pregnancy, third
  trimester (pregravid BMI 40.57)
  Gestational diabetes in pregnancy, diet
  controlled
  32 weeks gestation of pregnancy
 ----------------------------------------------------------------------
Vital Signs

                                                Height:        5'4"
Fetal Evaluation

 Num Of Fetuses:          1
 Fetal Heart Rate(bpm):   155
 Cardiac Activity:        Observed
 Presentation:            Cephalic
 Placenta:                Posterior
 P. Cord Insertion:       Visualized, central

 Amniotic Fluid
 AFI FV:      Within normal limits

 AFI Sum(cm)     %Tile       Largest Pocket(cm)
 14.49           50

 RUQ(cm)       RLQ(cm)       LUQ(cm)        LLQ(cm)

Biometry

 BPD:        79  mm     G. Age:  31w 5d         32  %    CI:        77.58   %    70 - 86
                                                         FL/HC:       21.6  %    19.1 -
 HC:      283.9  mm     G. Age:  31w 1d          5  %    HC/AC:       0.97       0.96 -
 AC:      292.2  mm     G. Age:  33w 2d         81  %    FL/BPD:      77.5  %    71 - 87
 FL:       61.2  mm     G. Age:  31w 5d         31  %    FL/AC:       20.9  %    20 - 24

 Est. FW:    0145   gm     4 lb 6 oz     65  %
OB History

 Gravidity:    2         Term:   1
 Living:       1
Gestational Age

 LMP:           32w 4d        Date:  10/09/17                 EDD:   07/16/18
 U/S Today:     32w 0d                                        EDD:   07/20/18
 Best:          32w 0d     Det. By:  Early Ultrasound         EDD:   07/20/18
                                     (01/09/18)
Anatomy

 Cranium:               Appears normal         LVOT:                   Previously seen
 Cavum:                 Previously seen        Aortic Arch:            Previously seen
 Ventricles:            Appears normal         Ductal Arch:            Previously seen
 Choroid Plexus:        Previously seen        Diaphragm:              Previously seen
 Cerebellum:            Previously seen        Stomach:                Appears normal, left
                                                                       sided
 Posterior Fossa:       Previously seen        Abdomen:                Previously seen
 Nuchal Fold:           Previously seen        Abdominal Wall:         Previously seen
 Face:                  Orbits and profile     Cord Vessels:           Previously seen
                        previously seen
 Lips:                  Previously seen        Kidneys:                Appear normal
 Palate:                Not well visualized    Bladder:                Appears normal
 Thoracic:              Appears normal         Spine:                  Previously seen
 Heart:                 Previously seen        Upper Extremities:      Previously seen
 RVOT:                  Appears normal         Lower Extremities:      Previously seen

 Other:  Fetus appears to be female. Nasal bone visualized previously. Heels
         visualized previously.
Cervix Uterus Adnexa

 Cervix
 Not visualized (advanced GA >86wks)
Impression

 Normal interval growth.
Recommendations

 Follow up growth in 4 weeks given BMI and GDM.

## 2020-05-08 DIAGNOSIS — M25561 Pain in right knee: Secondary | ICD-10-CM | POA: Diagnosis not present

## 2020-05-12 DIAGNOSIS — M25571 Pain in right ankle and joints of right foot: Secondary | ICD-10-CM | POA: Diagnosis not present

## 2020-05-29 DIAGNOSIS — M25561 Pain in right knee: Secondary | ICD-10-CM | POA: Diagnosis not present

## 2020-06-09 IMAGING — US US MFM OB FOLLOW UP
1 series · 14 of 28 positions shown · non-contrast
Comparison: none

[Series 1: us mfm ob follow up · 33 acquisitions, 14 frames shown]
[im 2/33]
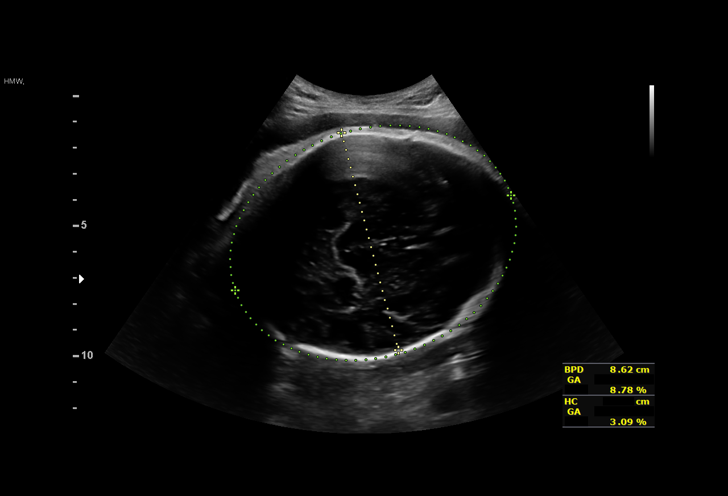
[im 4/33]
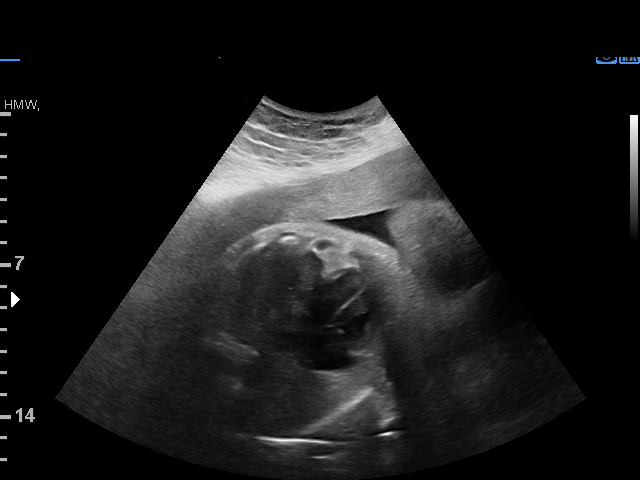
[im 6/33]
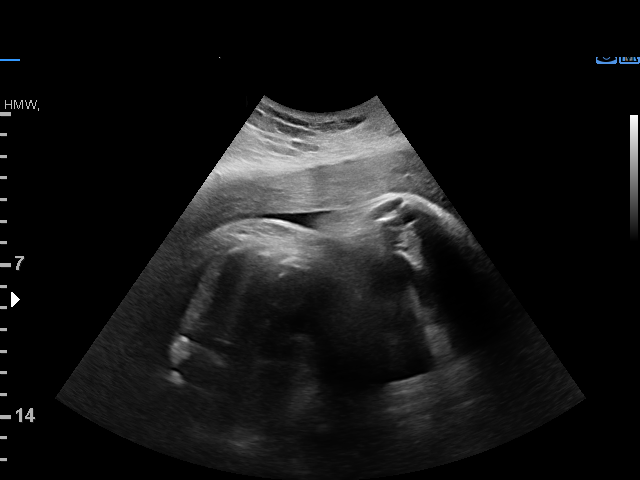
[im 9/33]
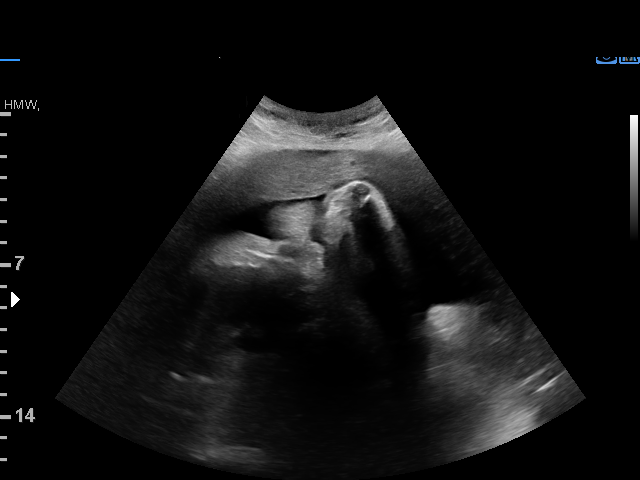
[im 11/33]
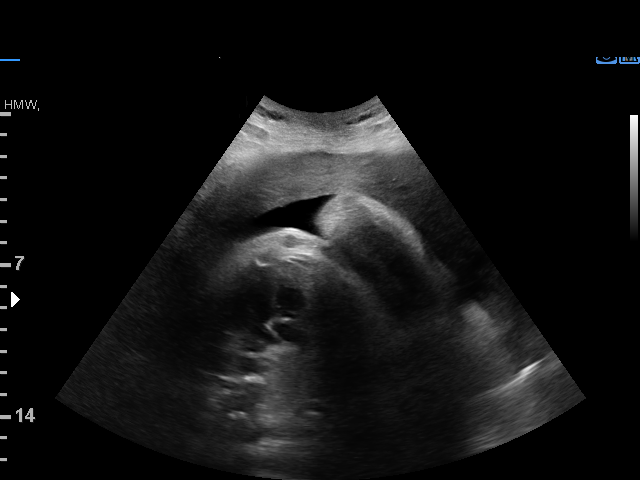
[im 14/33]
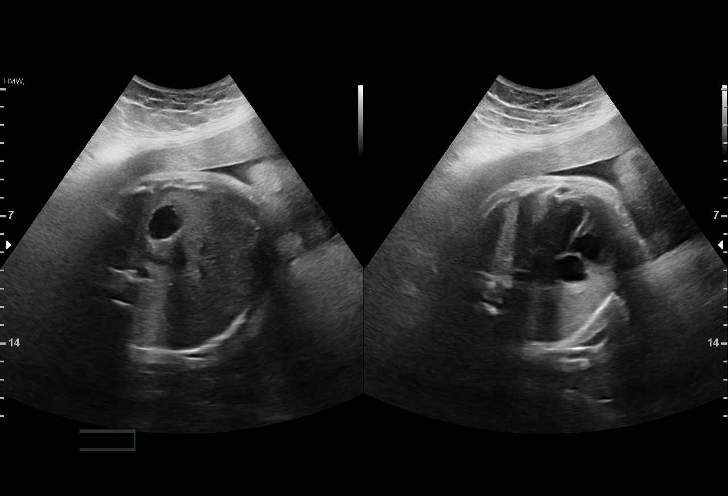
[im 16/33]
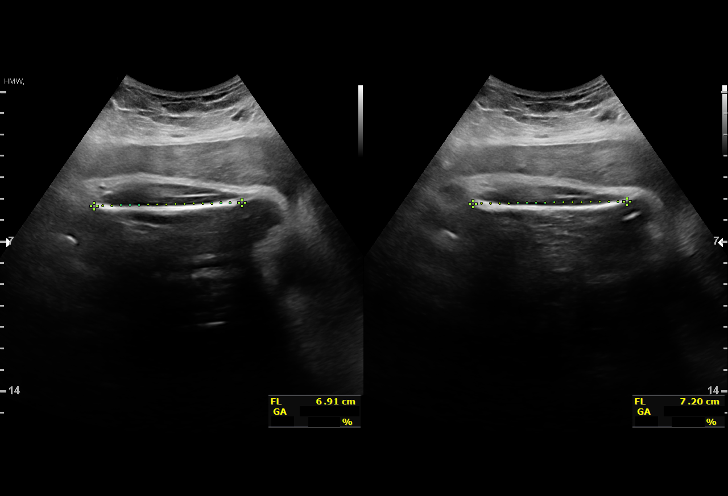
[im 18/33]
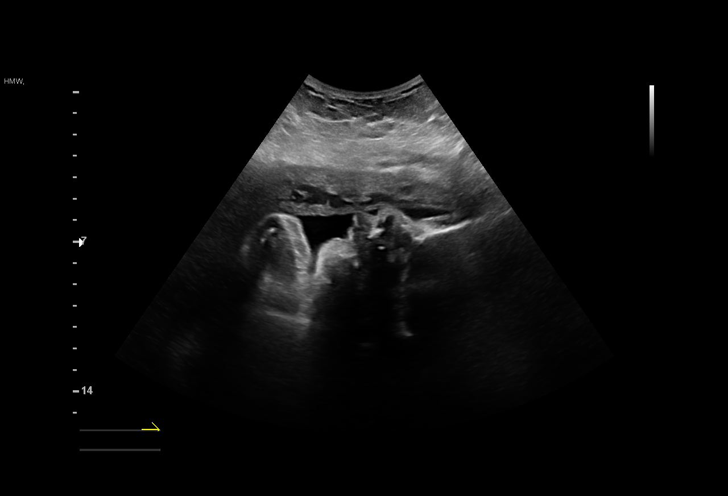
[im 21/33]
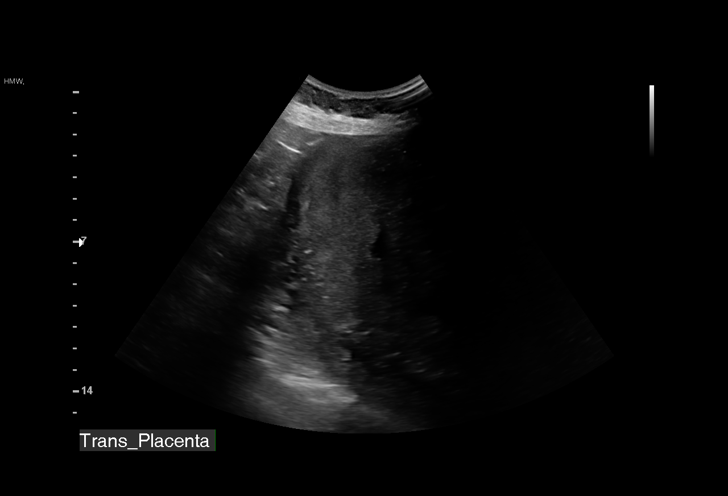
[im 23/33]
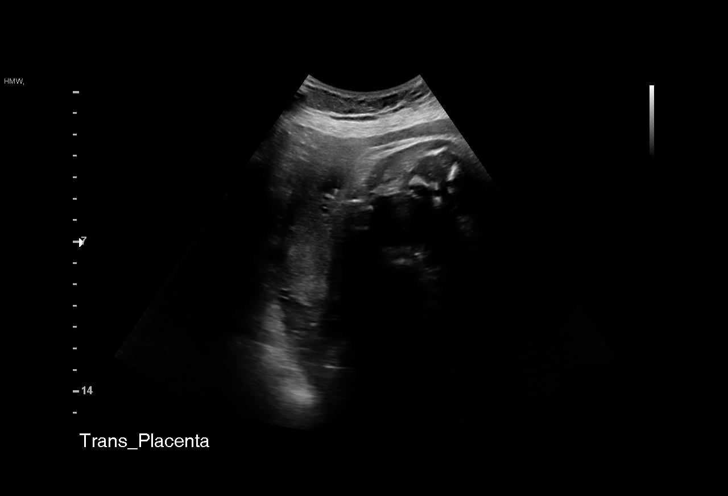
[im 25/33]
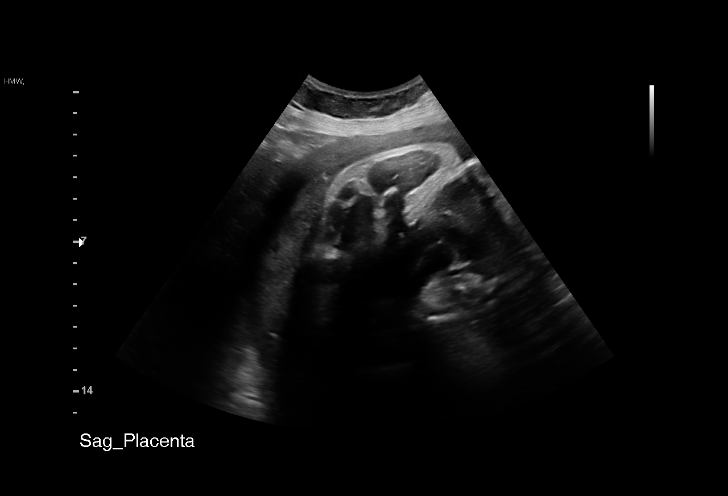
[im 28/33]
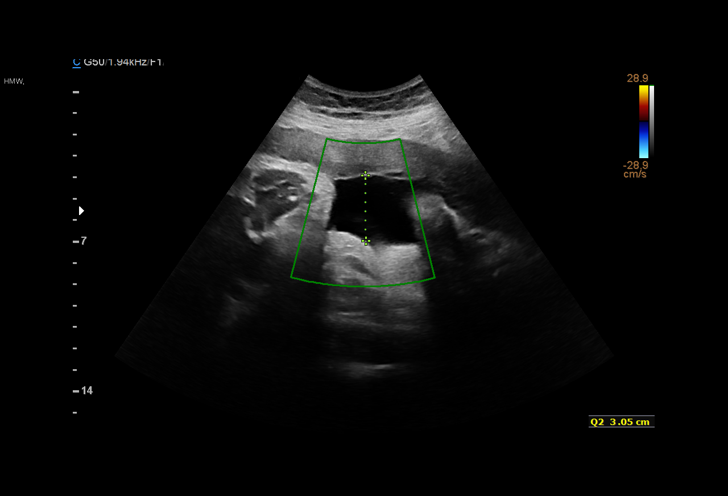
[im 30/33]
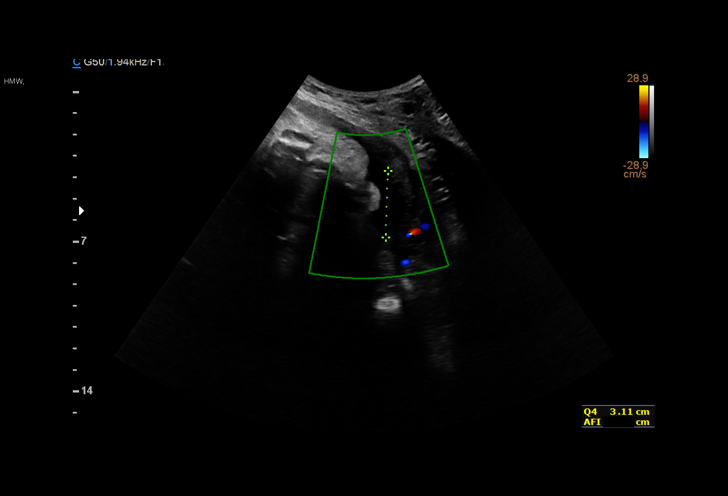
[im 33/33]
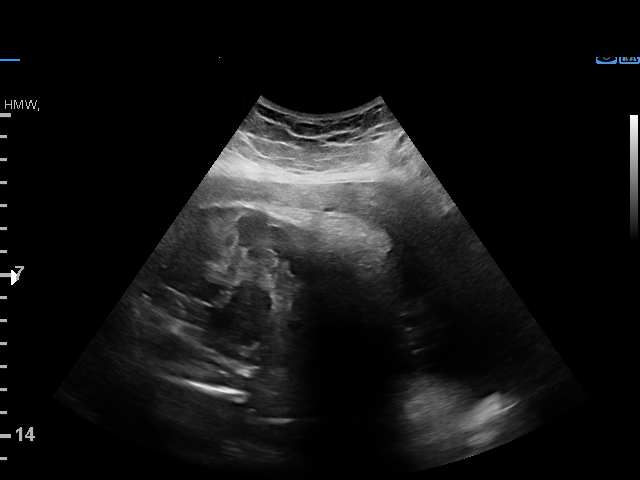

[14 of 28 positions shown; findings below may reference images not displayed]

NP

                                                       BIRANASH
     STRESS                                            BIRANASH
 ----------------------------------------------------------------------

 ----------------------------------------------------------------------
Indications

  History of cesarean delivery, currently
  pregnant
  Medical complication of pregnancy (maternal
  Gaven Aikens syndrome)
  Medical complication of pregnancy (maternal
  abdominal wall hernia)
  Obesity complicating pregnancy, third
  trimester (pregravid BMI 40.57)
  Gestational diabetes in pregnancy, diet
  controlled
  Encounter for other antenatal screening
  follow-up
  37 weeks gestation of pregnancy
 ----------------------------------------------------------------------
Vital Signs

                                                Height:        5'4"
Fetal Evaluation

 Num Of Fetuses:         1
 Fetal Heart Rate(bpm):  134
 Cardiac Activity:       Observed
 Presentation:           Cephalic
 Placenta:               Posterior
 P. Cord Insertion:      Previously Visualized
 Amniotic Fluid
 AFI FV:      Within normal limits

 AFI Sum(cm)     %Tile       Largest Pocket(cm)
 15.27           58

 RUQ(cm)       RLQ(cm)       LUQ(cm)        LLQ(cm)

Biophysical Evaluation

 Amniotic F.V:   Within normal limits       F. Tone:        Observed
 F. Movement:    Observed                   Score:          [DATE]
 F. Breathing:   Observed
Biometry

 BPD:      85.7  mm     G. Age:  34w 4d          7  %    CI:        74.11   %    70 - 86
                                                         FL/HC:      22.3   %    20.8 -
 HC:      316.1  mm     G. Age:  35w 4d          4  %    HC/AC:      0.92        0.92 -
 AC:      342.5  mm     G. Age:  38w 1d         86  %    FL/BPD:     82.3   %    71 - 87
 FL:       70.5  mm     G. Age:  36w 1d         25  %    FL/AC:      20.6   %    20 - 24

 Est. FW:    2388  gm    6 lb 12 oz      66  %
OB History

 Gravidity:    2         Term:   1
 Living:       1
Gestational Age

 LMP:           37w 5d        Date:  10/09/17                 EDD:   07/16/18
 U/S Today:     36w 1d                                        EDD:   07/27/18
 Best:          37w 1d     Det. By:  Early Ultrasound         EDD:   07/20/18
                                     (01/09/18)
Anatomy

 Cranium:               Appears normal         LVOT:                   Previously seen
 Cavum:                 Previously seen        Aortic Arch:            Previously seen
 Ventricles:            Appears normal         Ductal Arch:            Previously seen
 Choroid Plexus:        Previously seen        Diaphragm:              Previously seen
 Cerebellum:            Previously seen        Stomach:                Appears normal, left
                                                                       sided
 Posterior Fossa:       Previously seen        Abdomen:                Previously seen
 Nuchal Fold:           Previously seen        Abdominal Wall:         Previously seen
 Face:                  Orbits and profile     Cord Vessels:           Previously seen
                        previously seen
 Lips:                  Previously seen        Kidneys:                Previously seen
 Palate:                Not well visualized    Bladder:                Appears normal
 Thoracic:              Appears normal         Spine:                  Previously seen
 Heart:                 Previously seen        Upper Extremities:      Previously seen
 RVOT:                  Appears normal         Lower Extremities:      Previously seen

 Other:  Fetus appears to be female. Nasal bone visualized previously. Heels
         visualized previously.
Cervix Uterus Adnexa
 Cervix
 Not visualized (advanced GA >28wks)
Impression

 Normal interval growth.
Recommendations

 Follow up as clinically indicated.

## 2020-07-10 DIAGNOSIS — M25561 Pain in right knee: Secondary | ICD-10-CM | POA: Diagnosis not present

## 2020-09-20 ENCOUNTER — Ambulatory Visit: Payer: Medicaid Other | Admitting: Certified Nurse Midwife

## 2020-12-30 ENCOUNTER — Emergency Department (HOSPITAL_BASED_OUTPATIENT_CLINIC_OR_DEPARTMENT_OTHER): Payer: Medicaid Other

## 2020-12-30 ENCOUNTER — Emergency Department (HOSPITAL_BASED_OUTPATIENT_CLINIC_OR_DEPARTMENT_OTHER): Payer: Medicaid Other | Admitting: Radiology

## 2020-12-30 ENCOUNTER — Emergency Department (HOSPITAL_BASED_OUTPATIENT_CLINIC_OR_DEPARTMENT_OTHER)
Admission: EM | Admit: 2020-12-30 | Discharge: 2020-12-30 | Disposition: A | Discharge status: Home / Self Care | Payer: Medicaid Other | Attending: Emergency Medicine | Admitting: Emergency Medicine

## 2020-12-30 ENCOUNTER — Encounter (HOSPITAL_BASED_OUTPATIENT_CLINIC_OR_DEPARTMENT_OTHER): Payer: Self-pay | Admitting: Emergency Medicine

## 2020-12-30 ENCOUNTER — Other Ambulatory Visit: Payer: Self-pay

## 2020-12-30 DIAGNOSIS — R079 Chest pain, unspecified: Secondary | ICD-10-CM | POA: Insufficient documentation

## 2020-12-30 DIAGNOSIS — M25511 Pain in right shoulder: Secondary | ICD-10-CM | POA: Insufficient documentation

## 2020-12-30 DIAGNOSIS — M47812 Spondylosis without myelopathy or radiculopathy, cervical region: Secondary | ICD-10-CM | POA: Diagnosis not present

## 2020-12-30 DIAGNOSIS — M5080 Other cervical disc disorders, unspecified cervical region: Secondary | ICD-10-CM | POA: Diagnosis not present

## 2020-12-30 DIAGNOSIS — R0789 Other chest pain: Secondary | ICD-10-CM | POA: Diagnosis not present

## 2020-12-30 DIAGNOSIS — M5412 Radiculopathy, cervical region: Secondary | ICD-10-CM | POA: Diagnosis not present

## 2020-12-30 DIAGNOSIS — M519 Unspecified thoracic, thoracolumbar and lumbosacral intervertebral disc disorder: Secondary | ICD-10-CM

## 2020-12-30 LAB — CBC
HCT: 39.8 % (ref 36.0–46.0)
Hemoglobin: 13.5 g/dL (ref 12.0–15.0)
MCH: 32.8 pg (ref 26.0–34.0)
MCHC: 33.9 g/dL (ref 30.0–36.0)
MCV: 96.6 fL (ref 80.0–100.0)
Platelets: 243 K/uL (ref 150–400)
RBC: 4.12 MIL/uL (ref 3.87–5.11)
RDW: 13 % (ref 11.5–15.5)
WBC: 7 K/uL (ref 4.0–10.5)
nRBC: 0 % (ref 0.0–0.2)

## 2020-12-30 LAB — BASIC METABOLIC PANEL WITH GFR
Anion gap: 8 (ref 5–15)
BUN: 7 mg/dL (ref 6–20)
CO2: 25 mmol/L (ref 22–32)
Calcium: 9 mg/dL (ref 8.9–10.3)
Chloride: 103 mmol/L (ref 98–111)
Creatinine, Ser: 0.65 mg/dL (ref 0.44–1.00)
GFR, Estimated: >60 mL/min
Glucose, Bld: 145 mg/dL — ABNORMAL HIGH (ref 70–99)
Potassium: 3.6 mmol/L (ref 3.5–5.1)
Sodium: 136 mmol/L (ref 135–145)

## 2020-12-30 LAB — TROPONIN I (HIGH SENSITIVITY): Troponin I (High Sensitivity): <2 ng/L (ref <18)

## 2020-12-30 MED ORDER — OXYCODONE-ACETAMINOPHEN 5-325 MG PO TABS: 1.0000 | ORAL_TABLET | Freq: Four times a day (QID) | ORAL | 0 refills | Status: AC | PRN

## 2020-12-30 MED ORDER — OMEPRAZOLE 20 MG PO CPDR: 20.0000 mg | DELAYED_RELEASE_CAPSULE | Freq: Two times a day (BID) | ORAL | 0 refills | Status: AC

## 2020-12-30 MED ORDER — PREDNISONE 10 MG PO TABS: 20.0000 mg | ORAL_TABLET | Freq: Every day | ORAL | 0 refills | Status: AC

## 2020-12-30 MED ORDER — IBUPROFEN 600 MG PO TABS: 600.0000 mg | ORAL_TABLET | Freq: Four times a day (QID) | ORAL | 0 refills | Status: DC | PRN

## 2020-12-30 MED ORDER — METHOCARBAMOL 500 MG PO TABS
500.0000 mg | ORAL_TABLET | Freq: Once | ORAL | Status: AC
  Administered 2020-12-30: 500 mg via ORAL
  Filled 2020-12-30 (×2): qty 1

## 2020-12-30 MED ORDER — METHOCARBAMOL 500 MG PO TABS: 500.0000 mg | ORAL_TABLET | Freq: Two times a day (BID) | ORAL | 0 refills | Status: AC

## 2020-12-30 MED ORDER — NAPROXEN 250 MG PO TABS
500.0000 mg | ORAL_TABLET | Freq: Once | ORAL | Status: AC
  Administered 2020-12-30: 500 mg via ORAL
  Filled 2020-12-30: qty 2

## 2020-12-30 MED ORDER — ACETAMINOPHEN 325 MG PO TABS
650.0000 mg | ORAL_TABLET | Freq: Once | ORAL | Status: AC
  Administered 2020-12-30: 650 mg via ORAL
  Filled 2020-12-30: qty 2

## 2020-12-30 NOTE — ED Notes (Signed)
Pt discharged to home. Discharge instructions have been discussed with patient and/or family members. Pt verbally acknowledges understanding d/c instructions, and endorses comprehension to checkout at registration before leaving.  °

## 2020-12-30 NOTE — Discharge Instructions (Signed)
You are seen in the ER for arm and neck pain.  We suspect that your pain is because of nodes that are impinging on your nerves.  We recommend that he follow-up with the orthopedic doctor for your shoulder issues. We recommend that you follow-up with the neurosurgeon for the nodes.  The treatment is conservative with anti-inflammatory medication, muscle relaxants and rest.  Ice should also help. Return to the ER if your symptoms get worse.

## 2020-12-30 NOTE — ED Notes (Signed)
Pt unable to find ride home at this time.  Holding robaxin until ride is obtained. Pt aware and verbalized understanding.  MD notified.

## 2020-12-30 NOTE — ED Triage Notes (Signed)
Pt states right shoulder pain that shoots down her right arm and makes her right hand numb, along with upper left side chest pain that comes and goes for 1 week.  Right arm/shoulder pain is constant.

## 2021-01-01 NOTE — ED Provider Notes (Signed)
[facility] EMERGENCY DEPT Provider Note   CSN: [id number] Arrival date & time: 12/30/20  1962     History Chief Complaint  Patient presents with   Chest Pain    Mariah Weaver is a 35 y.o. female.  HPI    35 year old female comes in with chief complaint of chest pain.  Patient reports that she has been having right-sided chest pain, shoulder pain over the last for 2 or 3 weeks.  Pain is shooting down her fingers.  Patient denies any chest pain that is worse with deep inspiration, exertion.  Her pain is worse with burn movements.  Pain is also worse with neck movement.  No numbness or tingling associated with this.  Patient is left-handed and denies any repeated trauma or overuse of the right side.  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   CHEST PAIN 11/18/2006    Past Surgical History:  Procedure Laterality Date   CESAREAN SECTION  04/07/2012   Procedure: CESAREAN SECTION;  Surgeon: Mitchel Honour, DO;  Location: [facility] ORS;  Service: Obstetrics;  Laterality: N/A;  Primary Cesarean Section Delivery Baby  Boy  @ [phone],   Apgars 9/9   CESAREAN SECTION N/A 07/13/2018   Procedure: CESAREAN SECTION;  Surgeon: Conan Bowens, MD;  Location: [facility] ORS;  Service: Obstetrics;  Laterality: N/A;   EAR CANALOPLASTY     LEG SURGERY     MANDIBLE RECONSTRUCTION     TONSILLECTOMY     TUBAL LIGATION  07/13/2018   Procedure: BILATERAL  TUBAL LIGATION;  Surgeon: Conan Bowens, MD;  Location: [facility] OR[facility];  Service: Obstetrics;;   VENTRAL HERNIA REPAIR N/A 10/09/2018   Procedure: LAPAROSCOPIC ASSISTED REPAIR OF EPIGASTRIC HERNIA WITH  MESH;  Surgeon: Claud Kelp, MD;  Location: [facility] OR;  Service: General;  Laterality: N/A;     OB History     Gravida  2   Para  2   Term  2   Preterm      AB      Living  2      SAB      IAB      Ectopic      Multiple  0   Live Births  2           Family History  Adopted: Yes  Problem Relation Age of Onset   Heart disease Mother    Leukemia Father    Non-Hodgkin's lymphoma Brother     Social History   Tobacco Use   Smoking status: Never   Smokeless tobacco: Never  Vaping Use   Vaping Use: Never used  Substance Use Topics   Alcohol use: Yes    Comment: occasional wine   Drug use: No  Home Medications Prior to Admission medications   Medication Sig Start Date End Date Taking? Authorizing Provider  ibuprofen (ADVIL) 600 MG tablet Take 1 tablet (600 mg total) by mouth every 6 (six) hours as needed. 12/30/20  Yes Derwood Kaplan, MD  methocarbamol (ROBAXIN) 500 MG tablet Take 1 tablet (500 mg total) by mouth 2 (two) times daily. 12/30/20  Yes Derwood Kaplan, MD  omeprazole (PRILOSEC) 20 MG capsule Take 1 capsule (20 mg total) by mouth 2 (two) times daily before a meal. 12/30/20  Yes Artha Chiasson, MD  oxyCODONE-acetaminophen (PERCOCET/ROXICET) 5-325 MG tablet Take 1 tablet by mouth every 6 (six) hours as needed for severe pain. 12/30/20  Yes Solenne Manwarren, Janey Genta, MD  predniSONE (DELTASONE) 10 MG tablet Take 2 tablets (20 mg total) by mouth daily. 12/30/20  Yes Anjani Feuerborn, MD  dicyclomine (BENTYL) 10 MG capsule Take by mouth. 04/28/19   [provider]  fluconazole (DIFLUCAN) 150 MG tablet Take 1 tablet once for yeast infection; may repeat once in 3 days if needed 04/28/19   [provider]  pantoprazole (PROTONIX) 40 MG tablet Take by  mouth. 04/28/19 05/28/19  [provider]  sertraline (ZOLOFT) 25 MG tablet Take 1 tablet (25 mg total) by mouth daily. 05/21/19   Federico Flake, MD    Allergies    Sulfa antibiotics  Review of Systems   Review of Systems  Constitutional:  Positive for activity change.  Cardiovascular:  Positive for chest pain.  Musculoskeletal:  Positive for arthralgias.  Neurological:  Negative for weakness and numbness.   Physical Exam Updated Vital Signs BP 135/61   Pulse (!) 58   Temp 98.5 F (36.9 C)   Resp 18   Ht 5\' 4"  (1.626 m)   Wt 122 kg   LMP 12/16/2020 (Approximate) Comment: tubal ligation  SpO2 100%   Breastfeeding No   BMI 46.17 kg/m   Physical Exam Vitals and nursing note reviewed.  Constitutional:      Appearance: She is well-developed.  HENT:     Head: Atraumatic.  Cardiovascular:     Rate and Rhythm: Normal rate.  Pulmonary:     Effort: Pulmonary effort is normal.  Musculoskeletal:     Cervical back: Normal range of motion and neck supple.     Comments: Reproducible tenderness with palpation of the right elbow, right shoulder.  Patient also has midline C-spine tenderness. Patient able to flex and extend over the elbow, abduct and adduct her shoulders. Sensory exam is reassuring for upper extremities.  Range of motion of the digits is also normal  Skin:    General: Skin is warm and dry.  Neurological:     Mental Status: She is alert and oriented to person, place, and time.    ED Results / Procedures / Treatments   Labs (all labs ordered are listed, but only abnormal results are displayed) Labs Reviewed  BASIC METABOLIC PANEL - Abnormal; Notable for the following components:      Result Value   Glucose, Bld 145 (*)    All other components within normal limits  CBC  TROPONIN I (HIGH SENSITIVITY)    EKG EKG Interpretation  Date/Time:  Saturday December 30 2020 09:43:55 EDT Ventricular Rate:  77 PR Interval:  130 QRS Duration: 100 QT  Interval:  363 QTC Calculation: 411 R Axis:   63 Text Interpretation: Sinus rhythm Borderline T wave abnormalities No acute changes Confirmed by 1974 [phone]) on 12/30/2020 12:00:42 PM  Radiology No results found.  Procedures Procedures   Medications Ordered in ED Medications  naproxen (NAPROSYN) tablet 500 mg (500 mg Oral Given 12/30/20 1123)  acetaminophen (TYLENOL) tablet 650 mg (650 mg Oral Given 12/30/20 1123)  methocarbamol (ROBAXIN) tablet 500 mg (500 mg Oral Given 12/30/20 1403)    ED Course  I have reviewed the triage vital signs and the nursing notes.  Pertinent labs & imaging results that were available during my care of the patient were reviewed by me and considered in my medical decision making (see chart for details).    MDM Rules/Calculators/A&P                           35 year old female comes in with chief complaint of right-sided pain.  Pain is shooting down her right shoulder to the right hand.  She is also having neck pain.  Clinically, she is having radicular symptoms however, oddly, when I examine her elbow, shoulder, forearm, her pain gets worse which is not typical of radicular pain.  Other possibility includes impingement syndrome at the shoulder level, as patient reports that is the area with the most intense pain.  CT C-spine and shoulder x-ray ordered.  There is a nodule over the cervical spine that could be causing some impingement syndrome, therefore patient has been provided with neurosurgical follow-up.  I did discuss the [facility] with the orthopedist on-call.  They think it is reasonable for patient to also see them, in fact they have a nonoperative spine doctor who can look into the issue if needed.  Ultimately, given the CT finding, I have given patient both orthopedist and neurosurgical follow-up.  Final Clinical Impression(s) / ED Diagnoses Final diagnoses:  Schmorl's nodes of spinal region  Cervical radiculitis    Rx / DC  Orders ED Discharge Orders          Ordered    ibuprofen (ADVIL) 600 MG tablet  Every 6 hours PRN        12/30/20 1348    predniSONE (DELTASONE) 10 MG tablet  Daily        12/30/20 1348    omeprazole (PRILOSEC) 20 MG capsule  2 times daily before meals        12/30/20 1348    oxyCODONE-acetaminophen (PERCOCET/ROXICET) 5-325 MG tablet  Every 6 hours PRN        12/30/20 1348    methocarbamol (ROBAXIN) 500 MG tablet  2 times daily        12/30/20 1348             Derwood Kaplan, MD 01/01/21 1558

## 2021-01-09 DIAGNOSIS — M25511 Pain in right shoulder: Secondary | ICD-10-CM | POA: Diagnosis not present

## 2021-01-23 DIAGNOSIS — M542 Cervicalgia: Secondary | ICD-10-CM | POA: Diagnosis not present

## 2021-01-23 DIAGNOSIS — Z6841 Body Mass Index (BMI) 40.0 and over, adult: Secondary | ICD-10-CM | POA: Diagnosis not present

## 2021-01-23 DIAGNOSIS — M5412 Radiculopathy, cervical region: Secondary | ICD-10-CM | POA: Diagnosis not present

## 2021-02-07 DIAGNOSIS — M542 Cervicalgia: Secondary | ICD-10-CM | POA: Diagnosis not present

## 2021-04-20 DIAGNOSIS — R632 Polyphagia: Secondary | ICD-10-CM | POA: Diagnosis not present

## 2021-04-20 DIAGNOSIS — Z8632 Personal history of gestational diabetes: Secondary | ICD-10-CM | POA: Diagnosis not present

## 2021-04-20 DIAGNOSIS — Z6841 Body Mass Index (BMI) 40.0 and over, adult: Secondary | ICD-10-CM | POA: Diagnosis not present

## 2021-04-20 DIAGNOSIS — F411 Generalized anxiety disorder: Secondary | ICD-10-CM | POA: Diagnosis not present

## 2021-04-20 DIAGNOSIS — F330 Major depressive disorder, recurrent, mild: Secondary | ICD-10-CM | POA: Diagnosis not present

## 2021-05-02 DIAGNOSIS — Z6841 Body Mass Index (BMI) 40.0 and over, adult: Secondary | ICD-10-CM | POA: Diagnosis not present

## 2021-05-02 DIAGNOSIS — R632 Polyphagia: Secondary | ICD-10-CM | POA: Diagnosis not present

## 2021-07-26 DIAGNOSIS — M5412 Radiculopathy, cervical region: Secondary | ICD-10-CM | POA: Diagnosis not present

## 2021-07-27 ENCOUNTER — Encounter: Payer: Self-pay | Admitting: Rehabilitative and Restorative Service Providers"

## 2021-07-27 ENCOUNTER — Ambulatory Visit
Payer: Medicaid Other | Attending: Physical Medicine and Rehabilitation | Admitting: Rehabilitative and Restorative Service Providers"

## 2021-07-27 DIAGNOSIS — M542 Cervicalgia: Secondary | ICD-10-CM

## 2021-07-27 DIAGNOSIS — M50123 Cervical disc disorder at C6-C7 level with radiculopathy: Secondary | ICD-10-CM | POA: Diagnosis not present

## 2021-07-27 DIAGNOSIS — M6281 Muscle weakness (generalized): Secondary | ICD-10-CM

## 2021-07-27 DIAGNOSIS — R252 Cramp and spasm: Secondary | ICD-10-CM

## 2021-07-27 NOTE — Therapy (Signed)
?OUTPATIENT PHYSICAL THERAPY CERVICAL EVALUATION ? ? ?Patient Name: Mariah Weaver ?MRN: [id number] ?DOB:11/23/1985, 36 y.o., female ?Today's Date: 07/27/2021 ? ? PT End of Session - 07/27/21 0804   ? ? Visit Number 1   ? Date for PT Re-Evaluation 09/21/21   ? Authorization Type [place] Medicaid   ? PT Start Time 0800   ? PT Stop Time 0840   ? PT Time Calculation (min) 40 min   ? Activity Tolerance Patient tolerated treatment well   ? [facility] tasks assessed/performed   ? ?  ?  ? ?  ? ? ?Past Medical History:  ?Diagnosis Date  ? Arthritis   ? left knee  ? Birth defect   ? L side lims longer than right  ? Chronic back pain   ? Hx - rarely has back pain per patient at 09/30/18 PAT visit  ? Ear malformation   ? Hearing loss   ? 60% L ear and 40% right ear  ? Hx of varicella   ? Kidney stones   ? Klippel Trenaunay syndrome   ? Scoliosis   ? Scoliosis   ? Umbilical hernia   ? ?Past Surgical History:  ?Procedure Laterality Date  ? CESAREAN SECTION  04/07/2012  ? Procedure: CESAREAN SECTION;  Surgeon: Mitchel HonourMegan Morris, DO;  Location: [facility];  Service: Obstetrics;  Laterality: N/A;  Primary Cesarean Section Delivery Baby  Boy  @ [phone],   Apgars 9/9  ? CESAREAN SECTION N/A 07/13/2018  ? Procedure: CESAREAN SECTION;  Surgeon: Conan Bowensavis, Kelly M, MD;  [facility];  Service: Obstetrics;  Laterality: N/A;  ? EAR CANALOPLASTY    ? LEG SURGERY    ? MANDIBLE RECONSTRUCTION    ? TONSILLECTOMY    ? TUBAL LIGATION  07/13/2018  ? Procedure: BILATERAL TUBAL LIGATION;  Surgeon: Conan Bowensavis, Kelly M, MD;  Location: [facility] LD [facility];  Service: Obstetrics;;  ? VENTRAL HERNIA REPAIR N/A 10/09/2018  ? Procedure: LAPAROSCOPIC ASSISTED REPAIR OF EPIGASTRIC HERNIA WITH  MESH;  Surgeon: Claud KelpIngram, Haywood, MD;  [facility];  Service: General;  Laterality: N/A;  ? ?Patient Active Problem List  ? Diagnosis Date Noted  ? Obesity 05/07/2019  ? Ventral hernia 10/09/2018  ? H/O: C-section 07/13/2018  ? Abdominal wall hernia 01/10/2018  ? Klippel Trenaunay  syndrome 01/07/2018  ? CHEST PAIN 11/18/2006  ? ? ?PCP: Dr Lin GivensJeffries ? ?REFERRING PROVIDER: Romero BellingIbazebo, [place], MD ? ?REFERRING DIAG: Right Cervical Radicular Pain with C6-C7 Disc Bulge ? ?THERAPY DIAG:  ?Cervicalgia - Plan: PT plan of care cert/re-cert ? ?Muscle weakness (generalized) - Plan: PT plan of care cert/re-cert ? ?Cramp and spasm - Plan: PT plan of care cert/re-cert ? ?ONSET DATE: September 2022 ? ?SUBJECTIVE:                                                                                                                                                                                                        ? ?  SUBJECTIVE STATEMENT: ?Pt reports that in September of 2022, someone at work had a fall and she was trying to assist them up and she noticed that her neck started hurting after that time.  Pt reports that the pain has not really improved since that time. ? ?PERTINENT HISTORY:  ?Klippel Trenaunay Syndrome, Scoliosis, Ear Malformation with multiple surgeries ? ?PAIN:  ?Are you having pain? Yes: NPRS scale: 8/10 ?Pain location: cervical ?Pain description: radiating down right arm ?Aggravating factors: aggravated yesterday by autistic son jumping on her ?Relieving factors: unknown ? ?PRECAUTIONS: None ? ?WEIGHT BEARING RESTRICTIONS No ? ?FALLS:  ?Has patient fallen in last 6 months? No ? ?LIVING ENVIRONMENT: ?Lives with: lives with their family ?Lives in: House/apartment ?Stairs: Yes: External: 4 steps; can reach both ?Has following equipment at home: None ? 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 ?PLOF: Independent and Leisure: Glass blower/designer and jewelry ? ?PATIENT GOALS:  Wants to be able to decrease pain and be able to sew and do tedious job tasks again. ? ?OBJECTIVE:  ? ?DIAGNOSTIC FINDINGS:  ?CT Scan 12/30/2020:  Mild degenerative changes at C6-C7 with a prominent Schmorl's node indenting the superior C7 endplate. No evidence of spinal canal  or neural foraminal stenosis. ? ?PATIENT SURVEYS:  ?FOTO 51% at initial evaluation (anticipated 62% by visit 10) ? ? ?COGNITION: ?Overall cognitive status: Within functional limits for tasks assessed ? ? ?SENSATION: ?Light touch: WFL ? ?POSTURE:  ?Forward head, rounded shoulders, scoliosis ? ?PALPATION: ?Tightness noted along cervical region and upper traps, palpable trigger points  ? ?CERVICAL ROM:  ? ?Active ROM A/ROM (deg) ?07/27/2021  ?Flexion 45  ?Extension 30  ?Right lateral flexion 32  ?Left lateral flexion 24  ?Right rotation 40  ?Left rotation 40  ? (Blank rows = not tested) ? ?UE ROM: WFL with pain at end range ? ?UE MMT: ?Pt with RUE strength grossly 4-/5, LUE strength grossly 4/5 ? ?FUNCTIONAL TESTS:  ?07/27/2021: 5 times sit to stand: 12.3 sec without use of UE ? ?PATIENT SURVEYS:  ?Quick Dash 61.36 ? ?TODAY'S TREATMENT:  ?07/27/2021:  Reviewed HEP. ?If treatment provided at initial evaluation, no treatment charged due to lack of authorization.    ? ? ?PATIENT EDUCATION:  ?Education details: Issued HEP ?Person educated: Patient ?Education method: Explanation, Demonstration, and Handouts ?Education comprehension: verbalized understanding and returned demonstration ? ? ?HOME EXERCISE PROGRAM: ?Access Code: [email or web address] ?URL: [email or web address] ?Date: 07/27/2021 ?Prepared by: Reather Laurence ? ?Exercises ?- Seated Cervical Rotation AROM  - 1 x daily - 7 x weekly - 2 sets - 10 reps ?- Seated Cervical Flexion AROM  - 1 x daily - 7 x weekly - 2 sets - 10 reps ?- Seated Cervical Extension AROM  - 1 x daily - 7 x weekly - 2 sets - 10 reps ?- Seated Scapular Retraction  - 1 x daily - 7 x weekly - 2 sets - 10 reps ?- Seated Cervical Retraction  - 1 x daily - 7 x weekly - 2 sets - 10 reps ?- Seated Levator Scapulae Stretch  - 1 x daily - 7 x weekly - 1 sets - 3 reps - 20 sec hold ?- Seated Upper Trapezius Stretch  - 1 x daily - 7 x weekly - 1 sets - 3 reps - 20 sec hold ? ?ASSESSMENT: ? ?CLINICAL  IMPRESSION: ?Patient is a 36 y.o. female who was seen today for physical therapy evaluation and treatment for  cervical radiculopathy. 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  Pt states that in September 2022, she was trying to lift one of her clients who had fallen on the floor and injured her neck.  Since that time, she has had cervical pain that has not improved.  Pt presents with pain radiating down her arms, muscle weakness, decreased cervical A/ROM, and difficulty performing functional and leisurely tasks.  Pt has been unable to perform the fine tasks that she enjoys, such as sewing and making jewelry.  Pt would benefit from skilled PT to address her functional impairments to allow her to be more independent and pain free. ? ? ?OBJECTIVE IMPAIRMENTS decreased activity tolerance, decreased ROM, decreased strength, increased muscle spasms, impaired flexibility, impaired UE functional use, postural dysfunction, and pain.  ? ?ACTIVITY LIMITATIONS cleaning, community activity, driving, and occupation.  ? ?PERSONAL FACTORS 1 comorbidity: scoliosis  are also affecting patient's functional outcome.  ? ? ?REHAB POTENTIAL: Good ? ?CLINICAL DECISION MAKING: Evolving/moderate complexity ? ?EVALUATION COMPLEXITY: Low ? ? ?GOALS: ?Goals reviewed with patient? Yes ? ?SHORT TERM GOALS: Target date: 08/17/2021   ? ?Pt will be independent with in initial HEP. ?Baseline:  ?Goal status: INITIAL ? ?2.  Pt will report at least a 40% improvement in symptoms since starting PT. ?Baseline:  ?Goal status: INITIAL ? ?LONG TERM GOALS: Target date: 09/21/2021  ? ?Pt will be independent with advanced HEP. ?Baseline:  ?Goal status: INITIAL ? ?2.  Pt will increase FOTO to at least 62% to demonstrate improvements in functional mobility. ?Baseline: 51% ?Goal status: INITIAL ? ?3.  Pt will report being able to return to sewing and making jewelry with pain no greater than 4/10. ?Baseline: 8/10 ?Goal status: INITIAL ? ?4.  Pt will  increase bilat UE muscle strength to at least 4+/5 to allow her to lift groceries. ?Baseline: RUE 4-/5, LUE 4/5 ?Goal status: INITIAL ? ?PLAN: ?PT FREQUENCY: 2x/week ? ?PT DURATION: 8 weeks ? ?PLANNED INTERVE

## 2021-07-30 ENCOUNTER — Ambulatory Visit: Payer: Medicaid Other

## 2021-07-30 DIAGNOSIS — M542 Cervicalgia: Secondary | ICD-10-CM

## 2021-07-30 DIAGNOSIS — M6281 Muscle weakness (generalized): Secondary | ICD-10-CM

## 2021-07-30 DIAGNOSIS — R252 Cramp and spasm: Secondary | ICD-10-CM

## 2021-07-30 DIAGNOSIS — M50123 Cervical disc disorder at C6-C7 level with radiculopathy: Secondary | ICD-10-CM | POA: Diagnosis not present

## 2021-07-30 NOTE — Therapy (Signed)
?OUTPATIENT PHYSICAL THERAPY TREATMENT NOTE ? ? ?Patient Name: Mariah Weaver ?MRN: [id number] ?DOB:16-Feb-1986, 36 y.o., female ?Today's Date: 07/30/2021 ? ?PCP: None listed ?REFERRING PROVIDER:  ? Laroy Apple, MD  ? ? ?END OF SESSION:  ? PT End of Session - 07/30/21 1111   ? ? Visit Number 2   ? Date for PT Re-Evaluation 09/21/21   ? Authorization Type [place] Medicaid   ? PT Start Time 1100   ? PT Stop Time 1145   ? PT Time Calculation (min) 45 min   ? Activity Tolerance Patient tolerated treatment well   ? Behavior During Therapy [facility] for tasks assessed/performed   ? ?  ?  ? ?  ? ? ?Past Medical History:  ?Diagnosis Date  ? Arthritis   ? left knee  ? Birth defect   ? L side lims longer than right  ? Chronic back pain   ? Hx - rarely has back pain per patient at 09/30/18 PAT visit  ? Ear malformation   ? Hearing loss   ? 60% L ear and 40% right ear  ? Hx of varicella   ? Kidney stones   ? Klippel Trenaunay syndrome   ? Scoliosis   ? Scoliosis   ? Umbilical hernia   ? ?Past Surgical History:  ?Procedure Laterality Date  ? CESAREAN SECTION  04/07/2012  ? Procedure: CESAREAN SECTION;  Surgeon: Linda Hedges, DO;  Location: [facility];  Service: Obstetrics;  Laterality: N/A;  Primary Cesarean Section Delivery Baby  Boy  @ [phone],   Apgars 9/9  ? CESAREAN SECTION N/A 07/13/2018  ? Procedure: CESAREAN SECTION;  Surgeon: Sloan Leiter, MD;  Location: [facility] LD ORS;  Service: Obstetrics;  Laterality: N/A;  ? EAR CANALOPLASTY    ? LEG SURGERY    ? MANDIBLE RECONSTRUCTION    ? TONSILLECTOMY    ? TUBAL LIGATION  07/13/2018  ? Procedure: BILATERAL TUBAL LIGATION;  Surgeon: Sloan Leiter, MD;  Location: [place] LD ORS;  Service: Obstetrics;;  ? VENTRAL HERNIA REPAIR N/A 10/09/2018  ? Procedure: LAPAROSCOPIC ASSISTED REPAIR OF EPIGASTRIC HERNIA WITH  MESH;  Surgeon: Fanny Skates, MD;  Location: [place];  Service: General;  Laterality: N/A;  ? ?Patient Active Problem List  ? Diagnosis Date Noted  ? Obesity 05/07/2019  ? Ventral hernia 10/09/2018  ?  H/O: C-section 07/13/2018  ? Abdominal wall hernia 01/10/2018  ? Klippel Trenaunay syndrome 01/07/2018  ? CHEST PAIN 11/18/2006  ? ? ?REFERRING DIAG: Right Cervical Radicular Pain with C6-C7 Disc Bulge ? ?THERAPY DIAG:  ?Cervicalgia ? ?Muscle weakness (generalized) ? ?Cramp and spasm ? ?PERTINENT HISTORY: See above ? ?PRECAUTIONS: none ? ?SUBJECTIVE: Patient reports she is doing ok but numbness tingling persists in right UE and hand.  She is having trouble with gripping items and has to set her drink or something she is holding down in order to avoid dropping items.   ? ?PAIN:  ?Are you having pain? Yes: NPRS scale: 6/10 ?Pain location: entire arm ?Pain description: numbness tingling ?Aggravating factors: nothing in particular ?Relieving factors: rest ? ? ?OBJECTIVE: (objective measures completed at initial evaluation unless otherwise dated) ?OBJECTIVE:  ?  ?DIAGNOSTIC FINDINGS:  ?CT Scan 12/30/2020:  Mild degenerative changes at C6-C7 with a prominent Schmorl's node indenting the superior C7 endplate. No evidence of spinal canal or neural foraminal stenosis. ?  ?PATIENT SURVEYS:  ?FOTO 51% at initial evaluation (anticipated 62% by visit 10) ?  ?  ?COGNITION: ?Overall cognitive status: Within  functional limits for tasks assessed ?  ?  ?SENSATION: ?Light touch: WFL ?  ?POSTURE:  ?Forward head, rounded shoulders, scoliosis ?  ?PALPATION: ?Tightness noted along cervical region and upper traps, palpable trigger points            ?  ?CERVICAL ROM:  ?  ?Active ROM A/ROM (deg) ?07/27/2021  ?Flexion 45  ?Extension 30  ?Right lateral flexion 32  ?Left lateral flexion 24  ?Right rotation 40  ?Left rotation 40  ? (Blank rows = not tested) ?  ?UE ROM: WFL with pain at end range ?  ?UE MMT: ?Pt with RUE strength grossly 4-/5, LUE strength grossly 4/5 ?  ?FUNCTIONAL TESTS:  ?07/27/2021: 5 times sit to stand: 12.3 sec without use of UE ?  ?PATIENT SURVEYS:  ?Quick Dash 61.36 ?  ?TODAY'S TREATMENT:  ?07/30/2021:   ?UBE x 3 min  fwd  only ?Tband shoulder ext, rows, bilateral ER and horizontal abduction x 20 each with yellow band ?Traction: 12 lbs, intermittant Min 5 lbs, Max 12 lbs, hold 20 sec, rest 5 sec x 15 min ? ?TODAY'S TREATMENT:  ?07/27/2021:  Reviewed HEP. ?If treatment provided at initial evaluation, no treatment charged due to lack of authorization.                  ?  ?  ?PATIENT EDUCATION:  ?Education details: Issued HEP ?Person educated: Patient ?Education method: Explanation, Demonstration, and Handouts ?Education comprehension: verbalized understanding and returned demonstration ?  ?  ?HOME EXERCISE PROGRAM: ?Access Code: [email or web address] ?URL: [email or web address] ?Date: 07/27/2021 ?Prepared by: Juel Burrow ?  ?Exercises ?- Seated Cervical Rotation AROM  - 1 x daily - 7 x weekly - 2 sets - 10 reps ?- Seated Cervical Flexion AROM  - 1 x daily - 7 x weekly - 2 sets - 10 reps ?- Seated Cervical Extension AROM  - 1 x daily - 7 x weekly - 2 sets - 10 reps ?- Seated Scapular Retraction  - 1 x daily - 7 x weekly - 2 sets - 10 reps ?- Seated Cervical Retraction  - 1 x daily - 7 x weekly - 2 sets - 10 reps ?- Seated Levator Scapulae Stretch  - 1 x daily - 7 x weekly - 1 sets - 3 reps - 20 sec hold ?- Seated Upper Trapezius Stretch  - 1 x daily - 7 x weekly - 1 sets - 3 reps - 20 sec hold ?  ?ASSESSMENT: ?  ?CLINICAL IMPRESSION: ?Patient had some increase in symptoms with all exercises and UBE. Arm symptoms were relieved with traction but hand was still numb.  She may be experiencing carpal tunnel issues along with her disc bulge issues.  We will monitor these symptoms and report to MD.   Pt would benefit from skilled PT to address her functional impairments to allow her to be more independent and pain free. ?  ?  ?OBJECTIVE IMPAIRMENTS decreased activity tolerance, decreased ROM, decreased strength, increased muscle spasms, impaired flexibility, impaired UE functional use, postural dysfunction, and pain.  ?  ?ACTIVITY LIMITATIONS  cleaning, community activity, driving, and occupation.  ?  ?PERSONAL FACTORS 1 comorbidity: scoliosis  are also affecting patient's functional outcome.  ?  ?  ?REHAB POTENTIAL: Good ?  ?CLINICAL DECISION MAKING: Evolving/moderate complexity ?  ?EVALUATION COMPLEXITY: Low ?  ?  ?GOALS: ?Goals reviewed with patient? Yes ?  ?SHORT TERM GOALS: Target date: 08/17/2021   ?  ?Pt will be independent with in initial  HEP. ?Baseline:  ?Goal status: INITIAL ?  ?2.  Pt will report at least a 40% improvement in symptoms since starting PT. ?Baseline:  ?Goal status: INITIAL ?  ?LONG TERM GOALS: Target date: 09/21/2021  ?  ?Pt will be independent with advanced HEP. ?Baseline:  ?Goal status: INITIAL ?  ?2.  Pt will increase FOTO to at least 62% to demonstrate improvements in functional mobility. ?Baseline: 51% ?Goal status: INITIAL ?  ?3.  Pt will report being able to return to sewing and making jewelry with pain no greater than 4/10. ?Baseline: 8/10 ?Goal status: INITIAL ?  ?4.  Pt will increase bilat UE muscle strength to at least 4+/5 to allow her to lift groceries. ?Baseline: RUE 4-/5, LUE 4/5 ?Goal status: INITIAL ?  ?PLAN: ?PT FREQUENCY: 2x/week ?  ?PT DURATION: 8 weeks ?  ?PLANNED INTERVENTIONS: Therapeutic exercises, Therapeutic activity, Neuromuscular re-education, Balance training, Gait training, Patient/Family education, Joint manipulation, Joint mobilization, Aquatic Therapy, Dry Needling, Electrical stimulation, Spinal manipulation, Spinal mobilization, Cryotherapy, Moist heat, Taping, Vasopneumatic device, Traction, Ultrasound, Ionotophoresis 4mg /ml Dexamethasone, and Manual therapy ?  ?PLAN FOR NEXT SESSION: progress and assess HEP, strengthening, flexib ? ? ? ? ? ?Anderson Malta B. Sekai Nayak, PT ?07/30/21 12:24 PM  ? ?   ?

## 2021-08-01 ENCOUNTER — Encounter: Payer: Self-pay | Admitting: Rehabilitative and Restorative Service Providers"

## 2021-08-01 ENCOUNTER — Ambulatory Visit: Payer: Medicaid Other | Admitting: Rehabilitative and Restorative Service Providers"

## 2021-08-01 DIAGNOSIS — M6281 Muscle weakness (generalized): Secondary | ICD-10-CM

## 2021-08-01 DIAGNOSIS — M542 Cervicalgia: Secondary | ICD-10-CM

## 2021-08-01 DIAGNOSIS — R252 Cramp and spasm: Secondary | ICD-10-CM

## 2021-08-01 DIAGNOSIS — M50123 Cervical disc disorder at C6-C7 level with radiculopathy: Secondary | ICD-10-CM | POA: Diagnosis not present

## 2021-08-01 NOTE — Therapy (Signed)
?OUTPATIENT PHYSICAL THERAPY TREATMENT NOTE ? ? ?Patient Name: Mariah Weaver ?MRN: [id number] ?DOB:Sep 25, 1985, 36 y.o., female ?Today's Date: 08/01/2021 ? ?PCP: Dr Lin Givens per report ?REFERRING PROVIDER: Romero Belling, MD ? ? ?END OF SESSION:  ? PT End of Session - 08/01/21 0848   ? ? Visit Number 3   ? Date for PT Re-Evaluation 09/21/21   ? Authorization Type [place] Medicaid   ? Authorization Time Period 07/27/2021-09/25/2021   ? Authorization - Number of Visits 3   ? Progress Note Due on Visit 11   ? PT Start Time 0845   ? PT Stop Time 0925   ? PT Time Calculation (min) 40 min   ? Activity Tolerance Patient tolerated treatment well   ? Behavior During Therapy [facility] for tasks assessed/performed   ? ?  ?  ? ?  ? ? ?Past Medical History:  ?Diagnosis Date  ? Arthritis   ? left knee  ? Birth defect   ? L side lims longer than right  ? Chronic back pain   ? Hx - rarely has back pain per patient at 09/30/18 PAT visit  ? Ear malformation   ? Hearing loss   ? 60% L ear and 40% right ear  ? Hx of varicella   ? Kidney stones   ? Klippel Trenaunay syndrome   ? Scoliosis   ? Scoliosis   ? Umbilical hernia   ? ?Past Surgical History:  ?Procedure Laterality Date  ? CESAREAN SECTION  04/07/2012  ? Procedure: CESAREAN SECTION;  Surgeon: Mitchel Honour, DO;  Location: [facility] ORS;  Service: Obstetrics;  Laterality: N/A;  Primary Cesarean Section Delivery Baby  Boy  @ [phone],   Apgars 9/9  ? CESAREAN SECTION N/A 07/13/2018  ? Procedure: CESAREAN SECTION;  Surgeon: Conan Bowens, MD;  Location: [facility] LD ORS;  Service: Obstetrics;  Laterality: N/A;  ? EAR CANALOPLASTY    ? LEG SURGERY    ? MANDIBLE RECONSTRUCTION    ? TONSILLECTOMY    ? TUBAL LIGATION  07/13/2018  ? Procedure: BILATERAL TUBAL LIGATION;  Surgeon: Conan Bowens, MD;  Location: [facility] LD ORS;  Service: Obstetrics;;  ? VENTRAL HERNIA REPAIR N/A 10/09/2018  ? Procedure: LAPAROSCOPIC ASSISTED REPAIR OF EPIGASTRIC HERNIA WITH  MESH;  Surgeon: Claud Kelp, MD;  Location: [facility] OR;  Service: General;   Laterality: N/A;  ? ?Patient Active Problem List  ? Diagnosis Date Noted  ? Obesity 05/07/2019  ? Ventral hernia 10/09/2018  ? H/O: C-section 07/13/2018  ? Abdominal wall hernia 01/10/2018  ? Klippel Trenaunay syndrome 01/07/2018  ? CHEST PAIN 11/18/2006  ? ? ?REFERRING DIAG: Right Cervical Radicular Pain with C6-C7 Disc Bulge ? ?THERAPY DIAG:  ?Cervicalgia ? ?Muscle weakness (generalized) ? ?Cramp and spasm ? ?PERTINENT HISTORY: Klippel Trenaunay Syndrome, Scoliosis, Ear Malformation with multiple surgeries ? ?PRECAUTIONS: none ? ?SUBJECTIVE: Patient reports that she was able to get her Naproxen filled and she has noticed a decrease in pain.  States that traction seemed to help with her pain, as well. ? ?PAIN:  ?Are you having pain? Yes: NPRS scale: 3/10 ?Pain location: entire arm ?Pain description: numbness tingling ?Aggravating factors: nothing in particular ?Relieving factors: rest ? ? ?OBJECTIVE: (objective measures completed at initial evaluation unless otherwise dated) ? ?DIAGNOSTIC FINDINGS:  ?CT Scan 12/30/2020:  Mild degenerative changes at C6-C7 with a prominent Schmorl's node indenting the superior C7 endplate. No evidence of spinal canal or neural foraminal stenosis. ?  ?PATIENT SURVEYS:  ?FOTO 51% at  initial evaluation (anticipated 62% by visit 10) ?  ?  ?COGNITION: ?Overall cognitive status: Within functional limits for tasks assessed ?  ?  ?SENSATION: ?Light touch: WFL ?  ?POSTURE:  ?Forward head, rounded shoulders, scoliosis ?  ?PALPATION: ?Tightness noted along cervical region and upper traps, palpable trigger points            ?  ?CERVICAL ROM:  ?  ?Active ROM A/ROM (deg) ?07/27/2021  ?Flexion 45  ?Extension 30  ?Right lateral flexion 32  ?Left lateral flexion 24  ?Right rotation 40  ?Left rotation 40  ? (Blank rows = not tested) ?  ?UE ROM: WFL with pain at end range ?  ?UE MMT: ?07/27/2021:  Pt with RUE strength grossly 4-/5, LUE strength grossly 4/5 ?  ?FUNCTIONAL TESTS:  ?07/27/2021:  ?5 times  sit to stand: 12.3 sec without use of UE ?  ?PATIENT SURVEYS:  ?07/27/2021: ?Quick Dash 61.36 ?  ?TODAY'S TREATMENT:  ? ?08/01/2021: ?UBE level 1.0 x3 min each way with PT present to discuss status and plan ?Seated:  shoulder ER and horizontal shoulder abduction with yellow tband 2x10 each ?Seated prayer stretch 5 x 15 sec ?Standing rows and shoulder extension with yellow tband 2x10 each ?Standing chin tucks 2x10 ?Upper trap stretch 2x15 sec bilat ?Manual therapy in supine for soft tissue mobilization, suboccipital release, and manual traction to cervical region, left sternocleidomastoid, left temoralis and masseter.  Cervical PROM performed in supine. ? ?07/30/2021:   ?UBE x 3 min  fwd only ?Tband shoulder ext, rows, bilateral ER and horizontal abduction x 20 each with yellow band ?Traction: 12 lbs, intermittant Min 5 lbs, Max 12 lbs, hold 20 sec, rest 5 sec x 15 min ? ?07/27/2021:  Reviewed HEP. ?If treatment provided at initial evaluation, no treatment charged due to lack of authorization.                  ?  ?  ?PATIENT EDUCATION:  ?Education details: Issued HEP ?Person educated: Patient ?Education method: Explanation, Demonstration, and Handouts ?Education comprehension: verbalized understanding and returned demonstration ?  ?  ?HOME EXERCISE PROGRAM: ?Access Code: [email or web address] ?URL: [email or web address] ?Date: 07/27/2021 ?Prepared by: Reather Laurence ?  ?Exercises ?- Seated Cervical Rotation AROM  - 1 x daily - 7 x weekly - 2 sets - 10 reps ?- Seated Cervical Flexion AROM  - 1 x daily - 7 x weekly - 2 sets - 10 reps ?- Seated Cervical Extension AROM  - 1 x daily - 7 x weekly - 2 sets - 10 reps ?- Seated Scapular Retraction  - 1 x daily - 7 x weekly - 2 sets - 10 reps ?- Seated Cervical Retraction  - 1 x daily - 7 x weekly - 2 sets - 10 reps ?- Seated Levator Scapulae Stretch  - 1 x daily - 7 x weekly - 1 sets - 3 reps - 20 sec hold ?- Seated Upper Trapezius Stretch  - 1 x daily - 7 x weekly - 1 sets - 3  reps - 20 sec hold ?  ?ASSESSMENT: ?  ?CLINICAL IMPRESSION: ?Ms Witcher presents to PT with reports of still having hand numbness, particularly when using her cell phone.  Pt educated on exercises of proper technique and positioning for optimum benefits and decreased pain.  Following manual therapy, pt reports that her headache had decreased and her neck pain decreased.  Offered dry needling, but pt declines at this time.  Pt reports feeling less pain at  completion of session.  Pt continues to require skilled PT to progress towards goal related activities. ?  ?  ?OBJECTIVE IMPAIRMENTS decreased activity tolerance, decreased ROM, decreased strength, increased muscle spasms, impaired flexibility, impaired UE functional use, postural dysfunction, and pain.  ?  ?ACTIVITY LIMITATIONS cleaning, community activity, driving, and occupation.  ?  ?PERSONAL FACTORS 1 comorbidity: scoliosis  are also affecting patient's functional outcome.  ?  ?  ?REHAB POTENTIAL: Good ?  ?CLINICAL DECISION MAKING: Evolving/moderate complexity ?  ?EVALUATION COMPLEXITY: Low ?  ?  ?GOALS: ?Goals reviewed with patient? Yes ?  ?SHORT TERM GOALS: Target date: 08/17/2021   ?  ?Pt will be independent with in initial HEP. ?Baseline:  ?Goal status: Ongoing ?  ?2.  Pt will report at least a 40% improvement in symptoms since starting PT. ?Baseline:  ?Goal status: INITIAL ?  ?LONG TERM GOALS: Target date: 09/21/2021  ?  ?Pt will be independent with advanced HEP. ?Baseline:  ?Goal status: INITIAL ?  ?2.  Pt will increase FOTO to at least 62% to demonstrate improvements in functional mobility. ?Baseline: 51% ?Goal status: INITIAL ?  ?3.  Pt will report being able to return to sewing and making jewelry with pain no greater than 4/10. ?Baseline: 8/10 ?Goal status: INITIAL ?  ?4.  Pt will increase bilat UE muscle strength to at least 4+/5 to allow her to lift groceries. ?Baseline: RUE 4-/5, LUE 4/5 ?Goal status: INITIAL ?  ?PLAN: ?PT FREQUENCY: 2x/week ?  ?PT  DURATION: 8 weeks ?  ?PLANNED INTERVENTIONS: Therapeutic exercises, Therapeutic activity, Neuromuscular re-education, Balance training, Gait training, Patient/Family education, Joint manipulation, Joint mobili

## 2021-08-07 ENCOUNTER — Ambulatory Visit: Payer: Medicaid Other

## 2021-08-07 DIAGNOSIS — M542 Cervicalgia: Secondary | ICD-10-CM

## 2021-08-07 DIAGNOSIS — R252 Cramp and spasm: Secondary | ICD-10-CM

## 2021-08-07 DIAGNOSIS — M6281 Muscle weakness (generalized): Secondary | ICD-10-CM

## 2021-08-07 DIAGNOSIS — M50123 Cervical disc disorder at C6-C7 level with radiculopathy: Secondary | ICD-10-CM | POA: Diagnosis not present

## 2021-08-07 NOTE — Therapy (Signed)
OUTPATIENT PHYSICAL THERAPY TREATMENT NOTE   Patient Name: Mariah Weaver MRN: [id number] DOB:[birth year], 36 y.o., female Today's Date: 08/07/2021  PCP: Dr Lin Givens per report REFERRING PROVIDER: Romero Belling, MD   END OF SESSION:   PT End of Session - 08/07/21 0803     Visit Number 4    Number of Visits 10    Date for PT Re-Evaluation 09/21/21    Authorization Type [place] Medicaid    Authorization Time Period 07/27/2021-09/25/2021    Authorization - Visit Number 3    Authorization - Number of Visits 10    Progress Note Due on Visit 10    PT Start Time 0800    PT Stop Time 0848    PT Time Calculation (min) 48 min    Activity Tolerance Patient tolerated treatment well;Patient limited by pain    Behavior During Therapy [facility] for tasks assessed/performed             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   Hx of varicella    Kidney stones    Klippel Trenaunay syndrome    Scoliosis    Scoliosis    Umbilical hernia    Past Surgical History:  Procedure Laterality Date   CESAREAN SECTION  04/07/2012   Procedure: CESAREAN SECTION;  Surgeon: Mitchel Honour, DO;  Location: [facility] ORS;  Service: Obstetrics;  Laterality: N/A;  Primary Cesarean Section Delivery Baby  Boy  @ [phone],   Apgars 9/9   CESAREAN SECTION N/A 07/13/2018   Procedure: CESAREAN SECTION;  Surgeon: Conan Bowens, MD;  Location: [facility] LD ORS;  Service: Obstetrics;  Laterality: N/A;   EAR CANALOPLASTY     LEG SURGERY     MANDIBLE RECONSTRUCTION     TONSILLECTOMY     TUBAL LIGATION  07/13/2018   Procedure: BILATERAL TUBAL LIGATION;  Surgeon: Conan Bowens, MD;  Location: [facility] LD ORS;  Service: Obstetrics;;   VENTRAL HERNIA REPAIR N/A 10/09/2018   Procedure: LAPAROSCOPIC ASSISTED REPAIR OF EPIGASTRIC  HERNIA WITH  MESH;  Surgeon: Claud Kelp, MD;  Location: [facility] OR;  Service: General;  Laterality: N/A;   Patient Active Problem List   Diagnosis Date Noted   Obesity 05/07/2019   Ventral hernia 10/09/2018   H/O: C-section 07/13/2018   Abdominal wall hernia 01/10/2018   Klippel Trenaunay syndrome 01/07/2018   CHEST PAIN 11/18/2006    REFERRING DIAG: Right Cervical Radicular Pain with C6-C7 Disc Bulge  THERAPY DIAG:  Cervicalgia  Muscle weakness (generalized)  Cramp and spasm  PERTINENT HISTORY: Klippel Trenaunay Syndrome, Scoliosis, Ear Malformation with multiple surgeries  PRECAUTIONS: none  SUBJECTIVE: Patient reports that she is still having the numbness in both hands, in no particular radicular pattern, the entire hand on both sides.  She feels the traction is still helping with the right arm pain.  "I get a headache with the traction but it goes away gradually and the arm feels better"  but her hands have not changed.   PAIN:  Are you having pain? Yes: NPRS scale: 5/10 Pain location: right elbow Pain description: aching Aggravating factors: nothing in particular Relieving factors: rest   OBJECTIVE: (objective  measures completed at initial evaluation unless otherwise dated)  DIAGNOSTIC FINDINGS:  CT Scan 12/30/2020:  Mild degenerative changes at C6-C7 with a prominent Schmorl's node indenting the superior C7 endplate. No evidence of spinal canal or neural foraminal stenosis.   PATIENT SURVEYS:  FOTO 51% at initial evaluation (anticipated 62% by visit 10)     COGNITION: Overall cognitive status: Within functional limits for tasks assessed     SENSATION: Light touch: WFL   POSTURE:  Forward head, rounded shoulders, scoliosis   PALPATION: Tightness noted along cervical region and upper traps, palpable trigger points              CERVICAL ROM:    Active ROM A/ROM (deg) 07/27/2021  Flexion 45  Extension 30  Right lateral flexion 32  Left lateral flexion  24  Right rotation 40  Left rotation 40   (Blank rows = not tested)   UE ROM: WFL with pain at end range   UE MMT: 07/27/2021:  Pt with RUE strength grossly 4-/5, LUE strength grossly 4/5   FUNCTIONAL TESTS:  07/27/2021:  5 times sit to stand: 12.3 sec without use of UE   PATIENT SURVEYS:  07/27/2021: Nino Parsley Dash 61.36   TODAY'S TREATMENT:   08/07/2021: UBE level 1.0 x3 min each way with PT present to discuss status and plan Standing rows and shoulder extension with red tband 2x10 each Standing:  shoulder ER and horizontal shoulder abduction with red tband 2x10 each Prone shoulder extension (right) 2 lb 2 x 10 Prone shoulder row (right) 2 lb 2 x 10 Prone  shoulder horizontal abduction (right) 2 lb 2 x 10 Side lying ER  (right) 2 lb 2 x 10 Supine serratus punch (right) 2 lb 2 x 10 Manual therapy in supine for soft tissue mobilization, suboccipital release, and manual traction to cervical region,  Cervical PROM performed in supine.  08/01/2021: UBE level 1.0 x3 min each way with PT present to discuss status and plan Seated:  shoulder ER and horizontal shoulder abduction with yellow tband 2x10 each Seated prayer stretch 5 x 15 sec Standing rows and shoulder extension with yellow tband 2x10 each Standing chin tucks 2x10 Upper trap stretch 2x15 sec bilat Manual therapy in supine for soft tissue mobilization, suboccipital release, and manual traction to cervical region, left sternocleidomastoid, left temoralis and masseter.  Cervical PROM performed in supine.  07/30/2021:   UBE x 3 min  fwd only Tband shoulder ext, rows, bilateral ER and horizontal abduction x 20 each with yellow band Traction: 12 lbs, intermittant Min 5 lbs, Max 12 lbs, hold 20 sec, rest 5 sec x 15 min  07/27/2021:  Reviewed HEP. If treatment provided at initial evaluation, no treatment charged due to lack of authorization.                      PATIENT EDUCATION:  Education details: Issued HEP Person educated:  Patient Education method: Explanation, Facilities manager, and Handouts Education comprehension: verbalized understanding and returned demonstration     HOME EXERCISE PROGRAM: Access Code: [email or web address] URL: [email or web address] Date: 08/07/2021 Prepared by: Mikey Kirschner  Exercises - Seated Cervical Rotation AROM  - 1 x daily - 7 x weekly - 2 sets - 10 reps - Seated Cervical Flexion AROM  - 1 x daily - 7 x weekly - 2 sets - 10 reps - Seated Cervical Extension AROM  - 1 x daily - 7 x weekly - 2 sets - 10 reps - Seated Scapular  Retraction  - 1 x daily - 7 x weekly - 2 sets - 10 reps - Seated Cervical Retraction  - 1 x daily - 7 x weekly - 2 sets - 10 reps - Seated Levator Scapulae Stretch  - 1 x daily - 7 x weekly - 1 sets - 3 reps - 20 sec hold - Seated Upper Trapezius Stretch  - 1 x daily - 7 x weekly - 1 sets - 3 reps - 20 sec hold - Prone Shoulder Extension - Single Arm  - 2 x daily - 7 x weekly - 2 sets - 10 reps - Prone Shoulder Row  - 2 x daily - 7 x weekly - 2 sets - 10 reps - Prone Single Arm Shoulder Horizontal Abduction with Scapular Retraction and Palm Down  - 2 x daily - 7 x weekly - 2 sets - 10 reps - Sidelying Shoulder External Rotation  - 2 x daily - 7 x weekly - 2 sets - 10 reps - Single Arm Serratus Punches in Supine with Dumbbell  - 2 x daily - 7 x weekly - 2 sets - 10 reps - Standing Shoulder Flexion to 90 Degrees with Dumbbells  - 2 x daily - 7 x weekly - 2 sets - 10 reps - Standing Shoulder Scaption  - 2 x daily - 7 x weekly - 2 sets - 10 reps  ASSESSMENT:   CLINICAL IMPRESSION: Mariah Weaver presents to PT with reports of still having hand numbness bilaterally.  This appears to be worsening and occurring with more activities.  We added right shoulder rehab today due to significant atrophy and suspicion of right rotator cuff pathology.  Her hand symptoms may indicate possible carpal tunnel.    Pt continues to require skilled PT to progress towards goal related  activities.     OBJECTIVE IMPAIRMENTS decreased activity tolerance, decreased ROM, decreased strength, increased muscle spasms, impaired flexibility, impaired UE functional use, postural dysfunction, and pain.    ACTIVITY LIMITATIONS cleaning, community activity, driving, and occupation.    PERSONAL FACTORS 1 comorbidity: scoliosis  are also affecting patient's functional outcome.      REHAB POTENTIAL: Good   CLINICAL DECISION MAKING: Evolving/moderate complexity   EVALUATION COMPLEXITY: Low     GOALS: Goals reviewed with patient? Yes   SHORT TERM GOALS: Target date: 08/17/2021     Pt will be independent with in initial HEP. Baseline:  Goal status: Ongoing   2.  Pt will report at least a 40% improvement in symptoms since starting PT. Baseline:  Goal status: INITIAL   LONG TERM GOALS: Target date: 09/21/2021    Pt will be independent with advanced HEP. Baseline:  Goal status: INITIAL   2.  Pt will increase FOTO to at least 62% to demonstrate improvements in functional mobility. Baseline: 51% Goal status: INITIAL   3.  Pt will report being able to return to sewing and making jewelry with pain no greater than 4/10. Baseline: 8/10 Goal status: INITIAL   4.  Pt will increase bilat UE muscle strength to at least 4+/5 to allow her to lift groceries. Baseline: RUE 4-/5, LUE 4/5 Goal status: INITIAL   PLAN: PT FREQUENCY: 2x/week   PT DURATION: 8 weeks   PLANNED INTERVENTIONS: Therapeutic exercises, Therapeutic activity, Neuromuscular re-education, Balance training, Gait training, Patient/Family education, Joint manipulation, Joint mobilization, Aquatic Therapy, Dry Needling, Electrical stimulation, Spinal manipulation, Spinal mobilization, Cryotherapy, Moist heat, Taping, Vasopneumatic device, Traction, Ultrasound, Ionotophoresis 4mg /ml Dexamethasone, and Manual therapy  PLAN FOR NEXT SESSION: progress and assess HEP, strengthening, flexibility, traction and manual therapy  as indicated, progress right rotator cuff strengthening and scapular stabilization.    Victorino Dike B. Navarro Nine, PT 08/07/21 9:10 AM   [facility] [address] [place], [place] [address] Phone # [phone] Fax [phone]

## 2021-08-09 ENCOUNTER — Ambulatory Visit: Payer: Medicaid Other | Admitting: Rehabilitative and Restorative Service Providers"

## 2021-08-09 ENCOUNTER — Encounter: Payer: Self-pay | Admitting: Rehabilitative and Restorative Service Providers"

## 2021-08-09 DIAGNOSIS — R252 Cramp and spasm: Secondary | ICD-10-CM

## 2021-08-09 DIAGNOSIS — M542 Cervicalgia: Secondary | ICD-10-CM

## 2021-08-09 DIAGNOSIS — M6281 Muscle weakness (generalized): Secondary | ICD-10-CM

## 2021-08-09 DIAGNOSIS — M50123 Cervical disc disorder at C6-C7 level with radiculopathy: Secondary | ICD-10-CM | POA: Diagnosis not present

## 2021-08-09 NOTE — Therapy (Signed)
OUTPATIENT PHYSICAL THERAPY TREATMENT NOTE   Patient Name: Mariah Weaver MRN: [id number] DOB:07-01-1985, 36 y.o., female Today's Date: 08/09/2021  PCP: Dr Lin GivensJeffries per report REFERRING PROVIDER: Romero BellingIbazebo, [place], MD   END OF SESSION:   PT End of Session - 08/09/21 1057     Visit Number 5    Number of Visits 11    Date for PT Re-Evaluation 09/21/21    Authorization Type [place] Medicaid    Authorization Time Period 07/27/2021-09/25/2021    Authorization - Visit Number 4    Authorization - Number of Visits 11    Progress Note Due on Visit 10    PT Start Time 1056    PT Stop Time 1135    PT Time Calculation (min) 39 min    Activity Tolerance Patient tolerated treatment well;Patient limited by pain    Behavior During Therapy [facility] for tasks assessed/performed             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   Hx of varicella    Kidney stones    Klippel Trenaunay syndrome    Scoliosis    Scoliosis    Umbilical hernia    Past Surgical History:  Procedure Laterality Date   CESAREAN SECTION  04/07/2012   Procedure: CESAREAN SECTION;  Surgeon: Mitchel HonourMegan Morris, DO;  Location: [facility];  Service: Obstetrics;  Laterality: N/A;  Primary Cesarean Section Delivery Baby  Boy  @ [phone],   Apgars 9/9   CESAREAN SECTION N/A 07/13/2018   Procedure: CESAREAN SECTION;  Surgeon: Conan Bowensavis, Kelly M, MD;  Location: [facility];  Service: Obstetrics;  Laterality: N/A;   EAR CANALOPLASTY     LEG SURGERY     MANDIBLE RECONSTRUCTION     TONSILLECTOMY     TUBAL LIGATION  07/13/2018   Procedure: BILATERAL TUBAL LIGATION;  Surgeon: Conan Bowensavis, Kelly M, MD;  Location: [facility];  Service: Obstetrics;;   VENTRAL HERNIA REPAIR N/A 10/09/2018   Procedure: LAPAROSCOPIC ASSISTED REPAIR OF EPIGASTRIC  HERNIA WITH  MESH;  Surgeon: Claud KelpIngram, Haywood, MD;  [facility];  Service: General;  Laterality: N/A;   Patient Active Problem List   Diagnosis Date Noted   Obesity 05/07/2019   Ventral hernia 10/09/2018   H/O: C-section 07/13/2018   Abdominal wall hernia 01/10/2018   Klippel Trenaunay syndrome 01/07/2018   CHEST PAIN 11/18/2006    REFERRING DIAG: Right Cervical Radicular Pain with C6-C7 Disc Bulge  THERAPY DIAG:  Cervicalgia  Muscle weakness (generalized)  Cramp and spasm  PERTINENT HISTORY: Klippel Trenaunay Syndrome, Scoliosis, Ear Malformation with multiple surgeries  PRECAUTIONS: none  SUBJECTIVE: Pt is continuing to have right arm radicular pain.  PAIN:  Are you having pain? Yes: NPRS scale: 5/10 Pain location: right elbow Pain description: aching Aggravating factors: nothing in particular Relieving factors: rest   OBJECTIVE: (objective measures completed at initial evaluation unless otherwise dated)  DIAGNOSTIC FINDINGS:  CT Scan 12/30/2020:  Mild degenerative changes at C6-C7 with a prominent Schmorl's node indenting the superior C7 endplate. No evidence of spinal canal or neural foraminal stenosis.   PATIENT SURVEYS:  FOTO 51% at initial evaluation (anticipated 62% by  visit 10)     COGNITION: Overall cognitive status: Within functional limits for tasks assessed     SENSATION: Light touch: WFL   POSTURE:  Forward head, rounded shoulders, scoliosis   PALPATION: Tightness noted along cervical region and upper traps, palpable trigger points              CERVICAL ROM:    Active ROM A/ROM (deg) 07/27/2021  Flexion 45  Extension 30  Right lateral flexion 32  Left lateral flexion 24  Right rotation 40  Left rotation 40   (Blank rows = not tested)   UE ROM: WFL with pain at end range   UE MMT: 07/27/2021:  Pt with RUE strength grossly 4-/5, LUE strength grossly 4/5   FUNCTIONAL TESTS:  07/27/2021:  5 times sit to stand: 12.3 sec without use  of UE   PATIENT SURVEYS:  07/27/2021: Nino Parsley Dash 61.36   TODAY'S TREATMENT:   08/09/2021: UBE level 1.0 x3 min each way with PT present to discuss status and plan Standing rows and shoulder extension with red tband 2x10 each Seated prayer stretch 5 x 10 sec Prone with 2# dumbbell:  shoulder extension, row, shoulder horizontal abduction. RUE 2x10 Supine:  chin tuck, shoulder press, and arm press 2x10 each Manual therapy in supine for soft tissue mobilization, suboccipital release, and manual traction to cervical region, left sternocleidomastoid, left temoralis and masseter.  Cervical PROM performed in supine.  08/07/2021: UBE level 1.0 x3 min each way with PT present to discuss status and plan Standing rows and shoulder extension with red tband 2x10 each Standing:  shoulder ER and horizontal shoulder abduction with red tband 2x10 each Prone shoulder extension (right) 2 lb 2 x 10 Prone shoulder row (right) 2 lb 2 x 10 Prone  shoulder horizontal abduction (right) 2 lb 2 x 10 Side lying ER  (right) 2 lb 2 x 10 Supine serratus punch (right) 2 lb 2 x 10 Manual therapy in supine for soft tissue mobilization, suboccipital release, and manual traction to cervical region,  Cervical PROM performed in supine.  08/01/2021: UBE level 1.0 x3 min each way with PT present to discuss status and plan Seated:  shoulder ER and horizontal shoulder abduction with yellow tband 2x10 each Seated prayer stretch 5 x 15 sec Standing rows and shoulder extension with yellow tband 2x10 each Standing chin tucks 2x10 Upper trap stretch 2x15 sec bilat Manual therapy in supine for soft tissue mobilization, suboccipital release, and manual traction to cervical region, left sternocleidomastoid, left temoralis and masseter.  Cervical PROM performed in supine.              PATIENT EDUCATION:  Education details: Issued HEP Person educated: Patient Education method: Explanation, Facilities manager, and Handouts Education  comprehension: verbalized understanding and returned demonstration     HOME EXERCISE PROGRAM: Access Code: [email or web address] URL: [email or web address] Date: 08/07/2021 Prepared by: Mikey Kirschner  Exercises - Seated Cervical Rotation AROM  - 1 x daily - 7 x weekly - 2 sets - 10 reps - Seated Cervical Flexion AROM  - 1 x daily - 7 x weekly - 2 sets - 10 reps - Seated Cervical Extension AROM  - 1 x daily - 7 x weekly - 2 sets - 10 reps - Seated Scapular Retraction  - 1 x daily - 7 x weekly - 2 sets - 10 reps - Seated Cervical Retraction  - 1 x daily - 7 x weekly - 2 sets - 10 reps - Seated Levator Scapulae  Stretch  - 1 x daily - 7 x weekly - 1 sets - 3 reps - 20 sec hold - Seated Upper Trapezius Stretch  - 1 x daily - 7 x weekly - 1 sets - 3 reps - 20 sec hold - Prone Shoulder Extension - Single Arm  - 2 x daily - 7 x weekly - 2 sets - 10 reps - Prone Shoulder Row  - 2 x daily - 7 x weekly - 2 sets - 10 reps - Prone Single Arm Shoulder Horizontal Abduction with Scapular Retraction and Palm Down  - 2 x daily - 7 x weekly - 2 sets - 10 reps - Sidelying Shoulder External Rotation  - 2 x daily - 7 x weekly - 2 sets - 10 reps - Single Arm Serratus Punches in Supine with Dumbbell  - 2 x daily - 7 x weekly - 2 sets - 10 reps - Standing Shoulder Flexion to 90 Degrees with Dumbbells  - 2 x daily - 7 x weekly - 2 sets - 10 reps - Standing Shoulder Scaption  - 2 x daily - 7 x weekly - 2 sets - 10 reps   ASSESSMENT:   CLINICAL IMPRESSION: Ms Navarez presents to PT with reports of still having more right arm pain instead of bilat.  Pt overall reports that she has noticed approx a 20% improvement in overall symptoms. Pt with some increased pain and numbness with strengthening exercises, so transitioned to decompression exercises in supine to assist with decreased pain.  With supine decompression exercises, pt reported a decrease in numbness.  Following manual therapy, pt with reports of pain  decreasing to 2/10.  Pt continues to require skilled rehabilitation to progress towards goal related activities.     OBJECTIVE IMPAIRMENTS decreased activity tolerance, decreased ROM, decreased strength, increased muscle spasms, impaired flexibility, impaired UE functional use, postural dysfunction, and pain.    ACTIVITY LIMITATIONS cleaning, community activity, driving, and occupation.    PERSONAL FACTORS 1 comorbidity: scoliosis  are also affecting patient's functional outcome.      REHAB POTENTIAL: Good   CLINICAL DECISION MAKING: Evolving/moderate complexity   EVALUATION COMPLEXITY: Low     GOALS: Goals reviewed with patient? Yes   SHORT TERM GOALS: Target date: 08/17/2021     Pt will be independent with in initial HEP. Baseline:  Goal status: Ongoing   2.  Pt will report at least a 40% improvement in symptoms since starting PT. Baseline:  Goal status: Ongoing   LONG TERM GOALS: Target date: 09/21/2021    Pt will be independent with advanced HEP. Baseline:  Goal status: INITIAL   2.  Pt will increase FOTO to at least 62% to demonstrate improvements in functional mobility. Baseline: 51% Goal status: INITIAL   3.  Pt will report being able to return to sewing and making jewelry with pain no greater than 4/10. Baseline: 8/10 Goal status: INITIAL   4.  Pt will increase bilat UE muscle strength to at least 4+/5 to allow her to lift groceries. Baseline: RUE 4-/5, LUE 4/5 Goal status: INITIAL   PLAN: PT FREQUENCY: 2x/week   PT DURATION: 8 weeks   PLANNED INTERVENTIONS: Therapeutic exercises, Therapeutic activity, Neuromuscular re-education, Balance training, Gait training, Patient/Family education, Joint manipulation, Joint mobilization, Aquatic Therapy, Dry Needling, Electrical stimulation, Spinal manipulation, Spinal mobilization, Cryotherapy, Moist heat, Taping, Vasopneumatic device, Traction, Ultrasound, Ionotophoresis /ml Dexamethasone, and Manual therapy    PLAN FOR NEXT SESSION: progress and assess HEP, strengthening, flexibility, traction and  manual therapy as indicated, progress right rotator cuff strengthening and scapular stabilization.    Reather Laurence, PT 08/09/21 11:50 AM   [facility] [address] [place], [place] [address] Phone # [phone] Fax [phone]

## 2021-08-14 ENCOUNTER — Ambulatory Visit: Payer: Medicaid Other

## 2021-08-14 DIAGNOSIS — M542 Cervicalgia: Secondary | ICD-10-CM

## 2021-08-14 DIAGNOSIS — R252 Cramp and spasm: Secondary | ICD-10-CM

## 2021-08-14 DIAGNOSIS — M6281 Muscle weakness (generalized): Secondary | ICD-10-CM

## 2021-08-14 DIAGNOSIS — M50123 Cervical disc disorder at C6-C7 level with radiculopathy: Secondary | ICD-10-CM | POA: Diagnosis not present
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
## 2021-08-16 ENCOUNTER — Encounter: Payer: Self-pay | Admitting: Rehabilitative and Restorative Service Providers"

## 2021-08-16 ENCOUNTER — Ambulatory Visit
Payer: Medicaid Other | Attending: Physical Medicine and Rehabilitation | Admitting: Rehabilitative and Restorative Service Providers"

## 2021-08-16 DIAGNOSIS — R293 Abnormal posture: Secondary | ICD-10-CM | POA: Diagnosis not present

## 2021-08-16 DIAGNOSIS — R252 Cramp and spasm: Secondary | ICD-10-CM | POA: Insufficient documentation

## 2021-08-16 DIAGNOSIS — M419 Scoliosis, unspecified: Secondary | ICD-10-CM | POA: Diagnosis not present

## 2021-08-16 DIAGNOSIS — R202 Paresthesia of skin: Secondary | ICD-10-CM | POA: Insufficient documentation

## 2021-08-16 DIAGNOSIS — M542 Cervicalgia: Secondary | ICD-10-CM | POA: Diagnosis not present

## 2021-08-16 DIAGNOSIS — R200 Anesthesia of skin: Secondary | ICD-10-CM | POA: Insufficient documentation

## 2021-08-16 DIAGNOSIS — R262 Difficulty in walking, not elsewhere classified: Secondary | ICD-10-CM | POA: Insufficient documentation

## 2021-08-16 DIAGNOSIS — M6281 Muscle weakness (generalized): Secondary | ICD-10-CM | POA: Insufficient documentation
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
## 2021-08-21 ENCOUNTER — Encounter: Payer: Self-pay | Admitting: Rehabilitative and Restorative Service Providers"

## 2021-08-23 ENCOUNTER — Ambulatory Visit: Payer: Medicaid Other

## 2021-08-23 DIAGNOSIS — R200 Anesthesia of skin: Secondary | ICD-10-CM | POA: Diagnosis not present

## 2021-08-23 DIAGNOSIS — R252 Cramp and spasm: Secondary | ICD-10-CM

## 2021-08-23 DIAGNOSIS — M542 Cervicalgia: Secondary | ICD-10-CM

## 2021-08-23 DIAGNOSIS — R293 Abnormal posture: Secondary | ICD-10-CM | POA: Diagnosis not present

## 2021-08-23 DIAGNOSIS — R202 Paresthesia of skin: Secondary | ICD-10-CM | POA: Diagnosis not present

## 2021-08-23 DIAGNOSIS — M6281 Muscle weakness (generalized): Secondary | ICD-10-CM

## 2021-08-23 DIAGNOSIS — R262 Difficulty in walking, not elsewhere classified: Secondary | ICD-10-CM

## 2021-08-23 DIAGNOSIS — M419 Scoliosis, unspecified: Secondary | ICD-10-CM | POA: Diagnosis not present

## 2021-08-23 NOTE — Therapy (Signed)
OUTPATIENT PHYSICAL THERAPY TREATMENT NOTE   Patient Name: Mariah Weaver MRN: [id number] DOB:04-05-1985, 36 y.o., female Today's Date: 08/23/2021  PCP: Dr Gorden Harms per report REFERRING PROVIDER: Laroy Apple, MD   END OF SESSION:   PT End of Session - 08/23/21 1019     Visit Number 8    Number of Visits 11    Date for PT Re-Evaluation 09/21/21    Authorization Type [place] Medicaid    Authorization Time Period 07/27/2021-09/25/2021    Authorization - Visit Number 8    Authorization - Number of Visits 11    Progress Note Due on Visit 10    PT Start Time 1019    PT Stop Time 1110    PT Time Calculation (min) 51 min    Activity Tolerance Patient tolerated treatment well;Patient limited by pain    Behavior During Therapy [facility] for tasks assessed/performed             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   Hx of varicella    Kidney stones    Klippel Trenaunay syndrome    Scoliosis    Scoliosis    Umbilical hernia    Past Surgical History:  Procedure Laterality Date   CESAREAN SECTION  04/07/2012   Procedure: CESAREAN SECTION;  Surgeon: Linda Hedges, DO;  Location: [place] [facility];  Service: Obstetrics;  Laterality: N/A;  Primary Cesarean Section Delivery Baby  Boy  @ [phone],   Apgars 9/9   CESAREAN SECTION N/A 07/13/2018   Procedure: CESAREAN SECTION;  Surgeon: Sloan Leiter, MD;  Location: [facility];  Service: Obstetrics;  Laterality: N/A;   EAR CANALOPLASTY     LEG SURGERY     MANDIBLE RECONSTRUCTION     TONSILLECTOMY     TUBAL LIGATION  07/13/2018   Procedure: BILATERAL TUBAL LIGATION;  Surgeon: Sloan Leiter, MD;  Location: [place] [facility];  Service: Obstetrics;;   VENTRAL HERNIA REPAIR N/A 10/09/2018   Procedure: LAPAROSCOPIC ASSISTED REPAIR OF EPIGASTRIC  HERNIA WITH  MESH;  Surgeon: Fanny Skates, MD;  Location: [place];  Service: General;  Laterality: N/A;   Patient Active Problem List   Diagnosis Date Noted   Obesity 05/07/2019   Ventral hernia 10/09/2018   H/O: C-section 07/13/2018   Abdominal wall hernia 01/10/2018   Klippel Trenaunay syndrome 01/07/2018   CHEST PAIN 11/18/2006    REFERRING DIAG: Right Cervical Radicular Pain with C6-C7 Disc Bulge  THERAPY DIAG:  Cervicalgia  Muscle weakness (generalized)  Cramp and spasm  Difficulty in walking, not elsewhere classified  PERTINENT HISTORY: Klippel Trenaunay Syndrome, Scoliosis, Ear Malformation with multiple surgeries  PRECAUTIONS: none  SUBJECTIVE: Pt reports that she started taking Naproxen prescribed by MD and she feels a lot better at the neck and shoulders. She continues to have numbness bilateral hands.    PAIN:  Are you having pain? Yes: NPRS scale: 0/10 Pain location: right elbow Pain description: aching Aggravating factors: nothing in particular Relieving factors: rest   OBJECTIVE: (objective measures completed at initial evaluation unless otherwise dated)  DIAGNOSTIC FINDINGS:  CT Scan 12/30/2020:  Mild degenerative changes at C6-C7 with a prominent Schmorl's  node indenting the superior C7 endplate. No evidence of spinal canal or neural foraminal stenosis.   PATIENT SURVEYS:  FOTO 51% at initial evaluation (anticipated 62% by visit 10)     COGNITION: Overall cognitive status: Within functional limits for tasks assessed     SENSATION: Light touch: WFL   POSTURE:  Forward head, rounded shoulders, scoliosis   PALPATION: Tightness noted along cervical region and upper traps, palpable trigger points              CERVICAL ROM:    Active ROM A/ROM (deg) 07/27/2021  Flexion 45  Extension 30  Right lateral flexion 32  Left lateral flexion 24  Right rotation 40  Left rotation 40   (Blank rows = not tested)   UE ROM: WFL with pain at end  range   UE MMT: 07/27/2021:  Pt with RUE strength grossly 4-/5, LUE strength grossly 4/5   FUNCTIONAL TESTS:  07/27/2021:  5 times sit to stand: 12.3 sec without use of UE   PATIENT SURVEYS:  07/27/2021: Mikael Spray Dash 61.36   TODAY'S TREATMENT:   08/23/2021: NuStep (UBE occupied) x 5 min level 4 with PT present to discuss status and plan Seated:  scapular retraction, posterior shoulder rolls.  2x15 Seated prayer stretch 5 x 10 sec Seated chin tucks 2x15 Manual therapy in supine for soft tissue mobilization, suboccipital release, and manual traction to cervical region, left sternocleidomastoid, left temoralis and masseter.  Cervical PROM performed in supine. Moist heat to thoracic and cervical spine with interferrential estim to bilateral upper traps and parascapular areas.  X 10 min  08/16/2021: UBE level 1.0 x3 min each way with PT present to discuss status and plan Seated:  scapular retraction, posterior shoulder rolls.  2x15 Seated prayer stretch 5 x 10 sec Seated chin tucks 2x15 Manual therapy in supine for soft tissue mobilization, suboccipital release, and manual traction to cervical region, left sternocleidomastoid, left temoralis and masseter.  Cervical PROM performed in supine. Moist heat to thoracic and cervical spine with interferrential estim to bilateral upper traps and parascapular areas.  X 15 min  08/15/2021: Lengthy discussion at initial portion of visit regarding stress at home and symptoms worsening: patient has an aggressive autistic 42 y.o. son along with another child.  She an spouse have been a little argumentative.  She admits its likely due to her feeling frustrated with all of her pain issues.  But on top of the stress with her son, this is compounding her tension. We discussed strategies to control stress and possible community assistance for [employer].   Standing back against wall, shoulder retraction x 20, then cervical retraction x 20 (patient states hand went  from numb to tingling) Seated shoulder rolls x 20 Moist heat to thoracic and cervical spine with interferrential estim to bilateral upper traps and parascapular areas.  X 15 min.    08/09/2021: UBE level 1.0 x3 min each way with PT present to discuss status and plan Standing rows and shoulder extension with red tband 2x10 each Seated prayer stretch 5 x 10 sec Prone with 2# dumbbell:  shoulder extension, row, shoulder horizontal abduction. RUE 2x10 Supine:  chin tuck, shoulder press, and arm press 2x10 each Manual therapy in supine for soft tissue mobilization, suboccipital release, and manual traction to cervical region, left sternocleidomastoid, left temoralis and masseter.  Cervical PROM performed in supine.     PATIENT EDUCATION:  Education details: Issued HEP Person educated: Patient Education method: Explanation, Demonstration, and Handouts  Education comprehension: verbalized understanding and returned demonstration     HOME EXERCISE PROGRAM: Access Code: [email or web address] URL: [email or web address] Date: 08/07/2021 Prepared by: Candyce Churn  Exercises - Seated Cervical Rotation AROM  - 1 x daily - 7 x weekly - 2 sets - 10 reps - Seated Cervical Flexion AROM  - 1 x daily - 7 x weekly - 2 sets - 10 reps - Seated Cervical Extension AROM  - 1 x daily - 7 x weekly - 2 sets - 10 reps - Seated Scapular Retraction  - 1 x daily - 7 x weekly - 2 sets - 10 reps - Seated Cervical Retraction  - 1 x daily - 7 x weekly - 2 sets - 10 reps - Seated Levator Scapulae Stretch  - 1 x daily - 7 x weekly - 1 sets - 3 reps - 20 sec hold - Seated Upper Trapezius Stretch  - 1 x daily - 7 x weekly - 1 sets - 3 reps - 20 sec hold - Prone Shoulder Extension - Single Arm  - 2 x daily - 7 x weekly - 2 sets - 10 reps - Prone Shoulder Row  - 2 x daily - 7 x weekly - 2 sets - 10 reps - Prone Single Arm Shoulder Horizontal Abduction with Scapular Retraction and Palm Down  - 2 x daily - 7 x weekly - 2  sets - 10 reps - Sidelying Shoulder External Rotation  - 2 x daily - 7 x weekly - 2 sets - 10 reps - Single Arm Serratus Punches in Supine with Dumbbell  - 2 x daily - 7 x weekly - 2 sets - 10 reps - Standing Shoulder Flexion to 90 Degrees with Dumbbells  - 2 x daily - 7 x weekly - 2 sets - 10 reps - Standing Shoulder Scaption  - 2 x daily - 7 x weekly - 2 sets - 10 reps   ASSESSMENT:   CLINICAL IMPRESSION: Niharika had excellent response to Naproxen and reports very low pain level this morning.  She continues to have bilateral hand numbness today.  She Pt continues to require skilled PT to progress towards goal related activities.     OBJECTIVE IMPAIRMENTS decreased activity tolerance, decreased ROM, decreased strength, increased muscle spasms, impaired flexibility, impaired UE functional use, postural dysfunction, and pain.    ACTIVITY LIMITATIONS cleaning, community activity, driving, and occupation.    PERSONAL FACTORS 1 comorbidity: scoliosis  are also affecting patient's functional outcome.      REHAB POTENTIAL: Good   CLINICAL DECISION MAKING: Evolving/moderate complexity   EVALUATION COMPLEXITY: Low     GOALS: Goals reviewed with patient? Yes   SHORT TERM GOALS: Target date: 08/17/2021     Pt will be independent with in initial HEP. Baseline:  Goal status: Goal Met 08/16/2021   2.  Pt will report at least a 40% improvement in symptoms since starting PT. Baseline:  Goal status: Ongoing   LONG TERM GOALS: Target date: 09/21/2021    Pt will be independent with advanced HEP. Baseline:  Goal status: INITIAL   2.  Pt will increase FOTO to at least 62% to demonstrate improvements in functional mobility. Baseline: 51% Goal status: INITIAL   3.  Pt will report being able to return to sewing and making jewelry with pain no greater than 4/10. Baseline: 8/10 Goal status: INITIAL   4.  Pt will increase bilat UE muscle strength to at least 4+/5 to allow her to lift  groceries.  Baseline: RUE 4-/5, LUE 4/5 Goal status: INITIAL   PLAN: PT FREQUENCY: 2x/week   PT DURATION: 8 weeks   PLANNED INTERVENTIONS: Therapeutic exercises, Therapeutic activity, Neuromuscular re-education, Balance training, Gait training, Patient/Family education, Joint manipulation, Joint mobilization, Aquatic Therapy, Dry Needling, Electrical stimulation, Spinal manipulation, Spinal mobilization, Cryotherapy, Moist heat, Taping, Vasopneumatic device, Traction, Ultrasound, Ionotophoresis 18m/ml Dexamethasone, and Manual therapy   PLAN FOR NEXT SESSION:  Continue manual techniques and stim/heat if symptoms persist,  progress and assess HEP, strengthening, flexibility, traction and manual therapy as indicated, progress right rotator cuff strengthening and scapular stabilization.    JAnderson MaltaB. Ozil Stettler, PT 08/23/21 12:27 PM   [id number] [place] [id number] # [phone] [phone]

## 2021-08-28 ENCOUNTER — Ambulatory Visit: Payer: Medicaid Other

## 2021-08-28 DIAGNOSIS — R293 Abnormal posture: Secondary | ICD-10-CM | POA: Diagnosis not present

## 2021-08-28 DIAGNOSIS — R262 Difficulty in walking, not elsewhere classified: Secondary | ICD-10-CM | POA: Diagnosis not present

## 2021-08-28 DIAGNOSIS — M6281 Muscle weakness (generalized): Secondary | ICD-10-CM | POA: Diagnosis not present

## 2021-08-28 DIAGNOSIS — M542 Cervicalgia: Secondary | ICD-10-CM

## 2021-08-28 DIAGNOSIS — R200 Anesthesia of skin: Secondary | ICD-10-CM | POA: Diagnosis not present

## 2021-08-28 DIAGNOSIS — R252 Cramp and spasm: Secondary | ICD-10-CM | POA: Diagnosis not present

## 2021-08-28 DIAGNOSIS — M419 Scoliosis, unspecified: Secondary | ICD-10-CM | POA: Diagnosis not present

## 2021-08-28 DIAGNOSIS — R202 Paresthesia of skin: Secondary | ICD-10-CM | POA: Diagnosis not present

## 2021-08-28 NOTE — Therapy (Signed)
OUTPATIENT PHYSICAL THERAPY TREATMENT NOTE   Patient Name: Mariah Weaver MRN: [id number] DOB:12/01/1985, 36 y.o., female Today's Date: 08/28/2021  PCP: Dr Gorden Harms per report REFERRING PROVIDER: Laroy Apple, MD   END OF SESSION:   PT End of Session - 08/28/21 1019     Visit Number 9    Number of Visits 11    Date for PT Re-Evaluation 09/21/21    Authorization Type [place] Medicaid    Authorization Time Period 07/27/2021-09/25/2021    Authorization - Visit Number 9    Authorization - Number of Visits 11    Progress Note Due on Visit 10    PT Start Time 3254    PT Stop Time 1100    PT Time Calculation (min) 45 min    Activity Tolerance Patient tolerated treatment well;Patient limited by pain    Behavior During Therapy [facility] for tasks assessed/performed             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   Hx of varicella    Kidney stones    Klippel Trenaunay syndrome    Scoliosis    Scoliosis    Umbilical hernia    Past Surgical History:  Procedure Laterality Date   CESAREAN SECTION  04/07/2012   Procedure: CESAREAN SECTION;  Surgeon: Linda Hedges, DO;  Location: [place] [facility];  Service: Obstetrics;  Laterality: N/A;  Primary Cesarean Section Delivery Baby  Boy  @ [phone],   Apgars 9/9   CESAREAN SECTION N/A 07/13/2018   Procedure: CESAREAN SECTION;  Surgeon: Sloan Leiter, MD;  Location: [facility];  Service: Obstetrics;  Laterality: N/A;   EAR CANALOPLASTY     LEG SURGERY     MANDIBLE RECONSTRUCTION     TONSILLECTOMY     TUBAL LIGATION  07/13/2018   Procedure: BILATERAL TUBAL LIGATION;  Surgeon: Sloan Leiter, MD;  Location: [place] [facility];  Service: Obstetrics;;   VENTRAL HERNIA REPAIR N/A 10/09/2018   Procedure: LAPAROSCOPIC ASSISTED REPAIR OF EPIGASTRIC  HERNIA WITH  MESH;  Surgeon: Fanny Skates, MD;  Location: [place];  Service: General;  Laterality: N/A;   Patient Active Problem List   Diagnosis Date Noted   Obesity 05/07/2019   Ventral hernia 10/09/2018   H/O: C-section 07/13/2018   Abdominal wall hernia 01/10/2018   Klippel Trenaunay syndrome 01/07/2018   CHEST PAIN 11/18/2006    REFERRING DIAG: Right Cervical Radicular Pain with C6-C7 Disc Bulge  THERAPY DIAG:  Cervicalgia  Muscle weakness (generalized)  Cramp and spasm  PERTINENT HISTORY: Klippel Trenaunay Syndrome, Scoliosis, Ear Malformation with multiple surgeries  PRECAUTIONS: none  SUBJECTIVE: Pt reports that she is having more numbness today.  My right shoulder (refers to right upper trap) is hurting.    PAIN:  Are you having pain? Yes: NPRS scale: 0/10 Pain location: right elbow Pain description: aching Aggravating factors: nothing in particular Relieving factors: rest   OBJECTIVE: (objective measures completed at initial evaluation unless otherwise dated)  DIAGNOSTIC FINDINGS:  CT Scan 12/30/2020:  Mild degenerative changes at C6-C7 with a prominent Schmorl's node indenting the superior C7 endplate. No evidence of spinal canal or neural foraminal stenosis.  PATIENT SURVEYS:  FOTO 51% at initial evaluation (anticipated 62% by visit 10)     COGNITION: Overall cognitive status: Within functional limits for tasks assessed     SENSATION: Light touch: WFL   POSTURE:  Forward head, rounded shoulders, scoliosis   PALPATION: Tightness noted along cervical region and upper traps, palpable trigger points              CERVICAL ROM:    Active ROM A/ROM (deg) 07/27/2021  Flexion 45  Extension 30  Right lateral flexion 32  Left lateral flexion 24  Right rotation 40  Left rotation 40   (Blank rows = not tested)   UE ROM: WFL with pain at end range   UE MMT: 07/27/2021:  Pt with RUE strength grossly 4-/5, LUE strength grossly 4/5   FUNCTIONAL  TESTS:  07/27/2021:  5 times sit to stand: 12.3 sec without use of UE   PATIENT SURVEYS:  07/27/2021: Quick Dash 61.36   TODAY'S TREATMENT:   08/28/2021: UBE x 5 min (2.5 min fwd and 2.5 min rev) TYI's over physio ball 0# I's with thumbs up over physio ball x 20 Y's with thumbs up over physio ball x 20 with occasional rest breaks to complete all 20 T's with thumbs up x 20 over physio ball.   Plank push ups edge of raised treatment table 2 x 10 with 20 shoulder rolls after each set Seated chin tucks 2x15 Seated prayer stretch 5 x 10 sec Manual therapy in supine for soft tissue mobilization, suboccipital release, and manual traction to cervical region, left sternocleidomastoid, left temoralis and masseter.  Cervical PROM performed in supine. Moist heat to thoracic and cervical spine with interferrential estim to bilateral upper traps and parascapular areas.  X 10 min  08/23/2021: NuStep (UBE occupied) x 5 min level 4 with PT present to discuss status and plan Seated:  scapular retraction, posterior shoulder rolls.  2x15 Seated prayer stretch 5 x 10 sec Seated chin tucks 2x15 Manual therapy in supine for soft tissue mobilization, suboccipital release, and manual traction to cervical region, left sternocleidomastoid, left temoralis and masseter.  Cervical PROM performed in supine. Moist heat to thoracic and cervical spine with interferrential estim to bilateral upper traps and parascapular areas.  X 10 min  08/16/2021: UBE level 1.0 x3 min each way with PT present to discuss status and plan Seated:  scapular retraction, posterior shoulder rolls.  2x15 Seated prayer stretch 5 x 10 sec Seated chin tucks 2x15 Manual therapy in supine for soft tissue mobilization, suboccipital release, and manual traction to cervical region, left sternocleidomastoid, left temoralis and masseter.  Cervical PROM performed in supine. Moist heat to thoracic and cervical spine with interferrential estim to bilateral  upper traps and parascapular areas.  X 15 min    PATIENT EDUCATION:  Education details: Issued HEP Person educated: Patient Education method: Explanation, Demonstration, and Handouts Education comprehension: verbalized understanding and returned demonstration     HOME EXERCISE PROGRAM: Access Code: [email or web address] URL: [email or web address] Date: 08/07/2021 Prepared by: Candyce Churn  Exercises - Seated Cervical Rotation AROM  - 1 x daily - 7 x weekly - 2 sets - 10 reps - Seated Cervical Flexion AROM  - 1 x daily - 7 x weekly - 2 sets - 10 reps - Seated Cervical Extension AROM  - 1 x daily - 7 x weekly - 2 sets - 10 reps - Seated Scapular Retraction  - 1 x daily - 7 x weekly - 2 sets -  10 reps - Seated Cervical Retraction  - 1 x daily - 7 x weekly - 2 sets - 10 reps - Seated Levator Scapulae Stretch  - 1 x daily - 7 x weekly - 1 sets - 3 reps - 20 sec hold - Seated Upper Trapezius Stretch  - 1 x daily - 7 x weekly - 1 sets - 3 reps - 20 sec hold - Prone Shoulder Extension - Single Arm  - 2 x daily - 7 x weekly - 2 sets - 10 reps - Prone Shoulder Row  - 2 x daily - 7 x weekly - 2 sets - 10 reps - Prone Single Arm Shoulder Horizontal Abduction with Scapular Retraction and Palm Down  - 2 x daily - 7 x weekly - 2 sets - 10 reps - Sidelying Shoulder External Rotation  - 2 x daily - 7 x weekly - 2 sets - 10 reps - Single Arm Serratus Punches in Supine with Dumbbell  - 2 x daily - 7 x weekly - 2 sets - 10 reps - Standing Shoulder Flexion to 90 Degrees with Dumbbells  - 2 x daily - 7 x weekly - 2 sets - 10 reps - Standing Shoulder Scaption  - 2 x daily - 7 x weekly - 2 sets - 10 reps   ASSESSMENT:   CLINICAL IMPRESSION: Anjelika responds positively to therapy sessions but she continues to have bilateral hand numbness.  She was able to tolerate more aggressive postural and shoulder strengthening without increased symptoms.    She Pt continues to require skilled PT to progress towards  goal related activities.     OBJECTIVE IMPAIRMENTS decreased activity tolerance, decreased ROM, decreased strength, increased muscle spasms, impaired flexibility, impaired UE functional use, postural dysfunction, and pain.    ACTIVITY LIMITATIONS cleaning, community activity, driving, and occupation.    PERSONAL FACTORS 1 comorbidity: scoliosis  are also affecting patient's functional outcome.      REHAB POTENTIAL: Good   CLINICAL DECISION MAKING: Evolving/moderate complexity   EVALUATION COMPLEXITY: Low     GOALS: Goals reviewed with patient? Yes   SHORT TERM GOALS: Target date: 08/17/2021     Pt will be independent with in initial HEP. Baseline:  Goal status: Goal Met 08/16/2021   2.  Pt will report at least a 40% improvement in symptoms since starting PT. Baseline:  Goal status: Ongoing   LONG TERM GOALS: Target date: 09/21/2021    Pt will be independent with advanced HEP. Baseline:  Goal status: INITIAL   2.  Pt will increase FOTO to at least 62% to demonstrate improvements in functional mobility. Baseline: 51% Goal status: INITIAL   3.  Pt will report being able to return to sewing and making jewelry with pain no greater than 4/10. Baseline: 8/10 Goal status: INITIAL   4.  Pt will increase bilat UE muscle strength to at least 4+/5 to allow her to lift groceries. Baseline: RUE 4-/5, LUE 4/5 Goal status: INITIAL   PLAN: PT FREQUENCY: 2x/week   PT DURATION: 8 weeks   PLANNED INTERVENTIONS: Therapeutic exercises, Therapeutic activity, Neuromuscular re-education, Balance training, Gait training, Patient/Family education, Joint manipulation, Joint mobilization, Aquatic Therapy, Dry Needling, Electrical stimulation, Spinal manipulation, Spinal mobilization, Cryotherapy, Moist heat, Taping, Vasopneumatic device, Traction, Ultrasound, Ionotophoresis 45m/ml Dexamethasone, and Manual therapy   PLAN FOR NEXT SESSION:  Continue manual techniques and stim/heat if symptoms  persist,  progress and assess HEP, strengthening, flexibility, traction and manual therapy as indicated, progress right rotator cuff  strengthening and scapular stabilization.    Anderson Malta B. Matthieu Loftus, PT 08/28/21 1:59 PM   [facility] [address], [address] [place], [place] [address] Phone # [phone] Fax [phone]

## 2021-08-30 ENCOUNTER — Ambulatory Visit: Payer: Medicaid Other

## 2021-08-30 DIAGNOSIS — M6281 Muscle weakness (generalized): Secondary | ICD-10-CM

## 2021-08-30 DIAGNOSIS — R202 Paresthesia of skin: Secondary | ICD-10-CM | POA: Diagnosis not present

## 2021-08-30 DIAGNOSIS — R252 Cramp and spasm: Secondary | ICD-10-CM

## 2021-08-30 DIAGNOSIS — R293 Abnormal posture: Secondary | ICD-10-CM | POA: Diagnosis not present

## 2021-08-30 DIAGNOSIS — M542 Cervicalgia: Secondary | ICD-10-CM

## 2021-08-30 DIAGNOSIS — R262 Difficulty in walking, not elsewhere classified: Secondary | ICD-10-CM | POA: Diagnosis not present

## 2021-08-30 DIAGNOSIS — M419 Scoliosis, unspecified: Secondary | ICD-10-CM | POA: Diagnosis not present

## 2021-08-30 DIAGNOSIS — R200 Anesthesia of skin: Secondary | ICD-10-CM | POA: Diagnosis not present

## 2021-08-30 NOTE — Therapy (Signed)
OUTPATIENT PHYSICAL THERAPY PROGRESS NOTE   Patient Name: Mariah Weaver MRN: [id number] DOB:[birth year], 36 y.o., female Today's Date: 08/30/2021  PCP: Dr Gorden Harms per report REFERRING PROVIDER: Laroy Apple, MD   END OF SESSION:   PT End of Session - 08/30/21 1022     Visit Number 10    Number of Visits 10    Date for PT Re-Evaluation 09/21/21    Authorization Type [place] Medicaid    Authorization Time Period 07/27/2021-09/25/2021    Authorization - Visit Number 10    Authorization - Number of Visits 10    Progress Note Due on Visit 10    PT Start Time 1017    PT Stop Time 1100    PT Time Calculation (min) 43 min    Activity Tolerance Patient tolerated treatment well;Patient limited by pain    Behavior During Therapy [facility] for tasks assessed/performed             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   Hx of varicella    Kidney stones    Klippel Trenaunay syndrome    Scoliosis    Scoliosis    Umbilical hernia    Past Surgical History:  Procedure Laterality Date   CESAREAN SECTION  04/07/2012   Procedure: CESAREAN SECTION;  Surgeon: Linda Hedges, DO;  Location: [place] [facility];  Service: Obstetrics;  Laterality: N/A;  Primary Cesarean Section Delivery Baby  Boy  @ [phone],   Apgars 9/9   CESAREAN SECTION N/A 07/13/2018   Procedure: CESAREAN SECTION;  Surgeon: Sloan Leiter, MD;  Location: [facility];  Service: Obstetrics;  Laterality: N/A;   EAR CANALOPLASTY     LEG SURGERY     MANDIBLE RECONSTRUCTION     TONSILLECTOMY     TUBAL LIGATION  07/13/2018   Procedure: BILATERAL TUBAL LIGATION;  Surgeon: Sloan Leiter, MD;  Location: [place] [facility];  Service: Obstetrics;;   VENTRAL HERNIA REPAIR N/A 10/09/2018   Procedure: LAPAROSCOPIC ASSISTED REPAIR OF EPIGASTRIC  HERNIA WITH  MESH;  Surgeon: Fanny Skates, MD;  Location: [place];  Service: General;  Laterality: N/A;   Patient Active Problem List   Diagnosis Date Noted   Obesity 05/07/2019   Ventral hernia 10/09/2018   H/O: C-section 07/13/2018   Abdominal wall hernia 01/10/2018   Klippel Trenaunay syndrome 01/07/2018   CHEST PAIN 11/18/2006    REFERRING DIAG: Right Cervical Radicular Pain with C6-C7 Disc Bulge  THERAPY DIAG:  Cervicalgia  Muscle weakness (generalized)  Cramp and spasm  Abnormal posture  PERTINENT HISTORY: Klippel Trenaunay Syndrome, Scoliosis, Ear Malformation with multiple surgeries  PRECAUTIONS: none  SUBJECTIVE: Pt reports that she is having more numbness today.  My right shoulder (refers to right upper trap) is hurting.    PAIN:  Are you having pain? Yes: NPRS scale: 0/10 Pain location: right elbow Pain description: aching Aggravating factors: nothing in particular Relieving factors: rest  Patient has very minimal pain when taking Naproxen but without Naproxen, she continues to experience 9/10 pain when not taking meds.    OBJECTIVE: (objective measures completed at initial evaluation unless otherwise dated)  DIAGNOSTIC FINDINGS:  CT Scan  12/30/2020:  Mild degenerative changes at C6-C7 with a prominent Schmorl's node indenting the superior C7 endplate. No evidence of spinal canal or neural foraminal stenosis.   PATIENT SURVEYS:  FOTO 51% at initial evaluation (anticipated 62% by visit 10) (08/30/21 53%)     COGNITION: Overall cognitive status: Within functional limits for tasks assessed     SENSATION: Light touch: WFL (now having numbness bilateral hands fairly consistently with driving, mowing grass, holding her daughter, she has not been able to work)   POSTURE:  Forward head, rounded shoulders, scoliosis   PALPATION: Tightness noted along cervical region and upper traps, palpable trigger points   (08/30/21 left upper trap and parascapular areas  much improved, right upper trap and parascapular area still quite restricted and with trigger points)           CERVICAL ROM:    Active ROM A/ROM (deg) 07/27/2021 A/ROM (deg) 08/30/2021 With Naproxen  Flexion 45 45  Extension 30 35  Right lateral flexion 32 25  Left lateral flexion 24 25  Right rotation 40 40  Left rotation 40 45   (Blank rows = not tested)   UE ROM: WFL with pain at end range   UE MMT: 07/27/2021:  Pt with RUE strength grossly 4-/5, LUE strength grossly 4/5  (no changes on re-assessment on 08/30/21, bilateral ER shoulder ER still 4-/5)   FUNCTIONAL TESTS:  07/27/2021:  5 times sit to stand: 12.3 sec without use of UE  (08/30/21 11.3 sec)   PATIENT SURVEYS:  07/27/2021: Mikael Spray Dash 61.36  08/30/21 (Quick Dash 54.5%)    TODAY'S TREATMENT:   08/30/21: Re-assessment for additional visits Moist heat to thoracic and cervical spine with interferrential estim to bilateral upper traps and parascapular areas.  X 10 min  08/28/2021: UBE x 5 min (2.5 min fwd and 2.5 min rev) TYI's over physio ball 0# I's with thumbs up over physio ball x 20 Y's with thumbs up over physio ball x 20 with occasional rest breaks to complete all 20 T's with thumbs up x 20 over physio ball.   Plank push ups edge of raised treatment table 2 x 10 with 20 shoulder rolls after each set Seated chin tucks 2x15 Seated prayer stretch 5 x 10 sec Manual therapy in supine for soft tissue mobilization, suboccipital release, and manual traction to cervical region, left sternocleidomastoid, left temoralis and masseter.  Cervical PROM performed in supine. Moist heat to thoracic and cervical spine with interferrential estim to bilateral upper traps and parascapular areas.  X 10 min  08/23/2021: NuStep (UBE occupied) x 5 min level 4 with PT present to discuss status and plan Seated:  scapular retraction, posterior shoulder rolls.  2x15 Seated prayer stretch 5 x 10 sec Seated chin tucks 2x15 Manual therapy in  supine for soft tissue mobilization, suboccipital release, and manual traction to cervical region, left sternocleidomastoid, left temoralis and masseter.  Cervical PROM performed in supine. Moist heat to thoracic and cervical spine with interferrential estim to bilateral upper traps and parascapular areas.  X 10 min    PATIENT EDUCATION:  Education details: Issued HEP Person educated: Patient Education method: Explanation, Demonstration, and Handouts Education comprehension: verbalized understanding and returned demonstration     HOME EXERCISE PROGRAM: Access Code: [email or web address] URL: [email or web address] Date: 08/07/2021 Prepared by: Candyce Churn  Exercises - Seated Cervical Rotation AROM  - 1 x daily - 7 x weekly - 2 sets - 10 reps - Seated Cervical Flexion AROM  - 1 x  daily - 7 x weekly - 2 sets - 10 reps - Seated Cervical Extension AROM  - 1 x daily - 7 x weekly - 2 sets - 10 reps - Seated Scapular Retraction  - 1 x daily - 7 x weekly - 2 sets - 10 reps - Seated Cervical Retraction  - 1 x daily - 7 x weekly - 2 sets - 10 reps - Seated Levator Scapulae Stretch  - 1 x daily - 7 x weekly - 1 sets - 3 reps - 20 sec hold - Seated Upper Trapezius Stretch  - 1 x daily - 7 x weekly - 1 sets - 3 reps - 20 sec hold - Prone Shoulder Extension - Single Arm  - 2 x daily - 7 x weekly - 2 sets - 10 reps - Prone Shoulder Row  - 2 x daily - 7 x weekly - 2 sets - 10 reps - Prone Single Arm Shoulder Horizontal Abduction with Scapular Retraction and Palm Down  - 2 x daily - 7 x weekly - 2 sets - 10 reps - Sidelying Shoulder External Rotation  - 2 x daily - 7 x weekly - 2 sets - 10 reps - Single Arm Serratus Punches in Supine with Dumbbell  - 2 x daily - 7 x weekly - 2 sets - 10 reps - Standing Shoulder Flexion to 90 Degrees with Dumbbells  - 2 x daily - 7 x weekly - 2 sets - 10 reps - Standing Shoulder Scaption  - 2 x daily - 7 x weekly - 2 sets - 10 reps   ASSESSMENT:   CLINICAL  IMPRESSION: Mariah Weaver responds positively to therapy sessions but she continues to have bilateral hand numbness.  She has been able to tolerate more aggressive postural and shoulder strengthening without increased symptoms. Her quick dash and other objective findings are slightly improved.  She is making much improved progress with recent added Naproxen.    Pt continues to require skilled PT to progress towards goal related activities.     OBJECTIVE IMPAIRMENTS decreased activity tolerance, decreased ROM, decreased strength, increased muscle spasms, impaired flexibility, impaired UE functional use, postural dysfunction, and pain.    ACTIVITY LIMITATIONS cleaning, community activity, driving, and occupation.    PERSONAL FACTORS 1 comorbidity: scoliosis  are also affecting patient's functional outcome.      REHAB POTENTIAL: Good   CLINICAL DECISION MAKING: Evolving/moderate complexity   EVALUATION COMPLEXITY: Low     GOALS: Goals reviewed with patient? Yes   SHORT TERM GOALS: Target date: 08/17/2021     Pt will be independent with in initial HEP. Baseline:  Goal status: Goal Met 08/16/2021   2.  Pt will report at least a 40% improvement in symptoms since starting PT. Baseline:  Goal status: Ongoing   LONG TERM GOALS: Target date: 09/21/2021    Pt will be independent with advanced HEP. Baseline:  Goal status: On going   2.  Pt will increase FOTO to at least 62% to demonstrate improvements in functional mobility. Baseline: 51% Goal status: In progress   3.  Pt will report being able to return to sewing and making jewelry with pain no greater than 4/10. Baseline: 8/10 Goal status: In progress   4.  Pt will increase bilat UE muscle strength to at least 4+/5 to allow her to lift groceries. Baseline: RUE 4-/5, LUE 4/5 Goal status: In progress   PLAN: PT FREQUENCY: 2x/week   PT DURATION: 8 weeks   PLANNED INTERVENTIONS:  Therapeutic exercises, Therapeutic activity, Neuromuscular  re-education, Balance training, Gait training, Patient/Family education, Joint manipulation, Joint mobilization, Aquatic Therapy, Dry Needling, Electrical stimulation, Spinal manipulation, Spinal mobilization, Cryotherapy, Moist heat, Taping, Vasopneumatic device, Traction, Ultrasound, Ionotophoresis 11m/ml Dexamethasone, and Manual therapy   PLAN FOR NEXT SESSION:  Continue postural strengthening, manual techniques and stim/heat if symptoms persist,  progress and assess HEP, strengthening, flexibility, traction and manual therapy as indicated, progress right rotator cuff strengthening and scapular stabilization.    JAnderson MaltaB. Cristina Ceniceros, PT 08/30/21 9:33 PM   [place] [address]Phone # [phone] [phone]

## 2021-08-31 ENCOUNTER — Emergency Department (HOSPITAL_BASED_OUTPATIENT_CLINIC_OR_DEPARTMENT_OTHER)
Admission: EM | Admit: 2021-08-31 | Discharge: 2021-08-31 | Disposition: A | Discharge status: Home / Self Care | Payer: Medicaid Other | Attending: Emergency Medicine | Admitting: Emergency Medicine

## 2021-08-31 ENCOUNTER — Encounter (HOSPITAL_BASED_OUTPATIENT_CLINIC_OR_DEPARTMENT_OTHER): Payer: Self-pay

## 2021-08-31 ENCOUNTER — Other Ambulatory Visit: Payer: Self-pay

## 2021-08-31 ENCOUNTER — Telehealth: Payer: Self-pay | Admitting: *Deleted

## 2021-08-31 ENCOUNTER — Emergency Department (HOSPITAL_BASED_OUTPATIENT_CLINIC_OR_DEPARTMENT_OTHER): Payer: Medicaid Other

## 2021-08-31 DIAGNOSIS — R102 Pelvic and perineal pain: Secondary | ICD-10-CM | POA: Diagnosis not present

## 2021-08-31 DIAGNOSIS — R1030 Lower abdominal pain, unspecified: Secondary | ICD-10-CM | POA: Diagnosis present

## 2021-08-31 DIAGNOSIS — N289 Disorder of kidney and ureter, unspecified: Secondary | ICD-10-CM | POA: Diagnosis not present

## 2021-08-31 DIAGNOSIS — K5730 Diverticulosis of large intestine without perforation or abscess without bleeding: Secondary | ICD-10-CM | POA: Diagnosis not present

## 2021-08-31 LAB — CBC
HCT: 42.8 % (ref 36.0–46.0)
Hemoglobin: 14 g/dL (ref 12.0–15.0)
MCH: 31.7 pg (ref 26.0–34.0)
MCHC: 32.7 g/dL (ref 30.0–36.0)
MCV: 97.1 fL (ref 80.0–100.0)
Platelets: 250 10*3/uL (ref 150–400)
RBC: 4.41 MIL/uL (ref 3.87–5.11)
RDW: 12.9 % (ref 11.5–15.5)
WBC: 5.3 10*3/uL (ref 4.0–10.5)
nRBC: 0 % (ref 0.0–0.2)

## 2021-08-31 LAB — COMPREHENSIVE METABOLIC PANEL
ALT: 13 U/L (ref 0–44)
AST: 14 U/L — ABNORMAL LOW (ref 15–41)
Albumin: 4 g/dL (ref 3.5–5.0)
Alkaline Phosphatase: 69 U/L (ref 38–126)
Anion gap: 8 (ref 5–15)
BUN: 11 mg/dL (ref 6–20)
CO2: 26 mmol/L (ref 22–32)
Calcium: 9.2 mg/dL (ref 8.9–10.3)
Chloride: 102 mmol/L (ref 98–111)
Creatinine, Ser: 0.73 mg/dL (ref 0.44–1.00)
GFR, Estimated: >60 mL/min (ref >60)
Glucose, Bld: 107 mg/dL — ABNORMAL HIGH (ref 70–99)
Potassium: 4 mmol/L (ref 3.5–5.1)
Sodium: 136 mmol/L (ref 135–145)
Total Bilirubin: 0.9 mg/dL (ref 0.3–1.2)
Total Protein: 7.1 g/dL (ref 6.5–8.1)

## 2021-08-31 LAB — URINALYSIS, ROUTINE W REFLEX MICROSCOPIC
Bilirubin Urine: NEGATIVE
Glucose, UA: NEGATIVE mg/dL
Ketones, ur: NEGATIVE mg/dL
Nitrite: NEGATIVE
Specific Gravity, Urine: 1.023 (ref 1.005–1.030)
pH: 7.5 (ref 5.0–8.0)

## 2021-08-31 LAB — PREGNANCY, URINE: Preg Test, Ur: NEGATIVE

## 2021-08-31 LAB — LIPASE, BLOOD: Lipase: 20 U/L (ref 11–51)

## 2021-08-31 MED ORDER — CEPHALEXIN 500 MG PO CAPS: 500.0000 mg | ORAL_CAPSULE | Freq: Four times a day (QID) | ORAL | 0 refills | Status: AC

## 2021-08-31 MED ORDER — IOHEXOL 300 MG/ML  SOLN
100.0000 mL | Freq: Once | INTRAMUSCULAR | Status: AC | PRN
  Administered 2021-08-31: 100 mL via INTRAVENOUS

## 2021-08-31 NOTE — ED Triage Notes (Signed)
Patient here POV from Home.  Endorses Pain to Mid/Lower ABD that began yesterday. Intermittent in [place] and Environmental health practitioner in [place].   Moderate Nausea. No Emesis. No Diarrhea. Mild Constipation Possibly. Spotting Yesterday. No Discernable Bleeding today. No Known Fevers.  Previous Tubal Ligation 3 Years PTA.  NAD Noted during Triage. A&Ox4. GCS 15. Ambulatory.

## 2021-08-31 NOTE — Telephone Encounter (Signed)
TC from pt reporting abdominal pain and bleeding. HX of BTL. Sts feels like it did when she was pregnant. Advised pt to seek care in the emergency department for urgent concern as we are unable to evaluate her concern over the phone.

## 2021-08-31 NOTE — ED Provider Notes (Signed)
[facility] EMERGENCY DEPT Provider Note   CSN: [id number] Arrival date & time: 08/31/21  1219     History  Chief Complaint  Patient presents with   Abdominal Pain    Mariah Weaver is a 36 y.o. female.  HPI 36 year old G2, P2, status post bilateral tubal ligation LMP mid May, presents today for evaluation of lower abdominal pain.  Patient states states that she had some vaginal spotting yesterday.  She spoke with her primary care office and was told to come to the ED for evaluation due to concerns of ectopic pregnancy     Home Medications Prior to Admission medications   Medication Sig Start Date End Date Taking? Authorizing Provider  cephALEXin (KEFLEX) 500 MG capsule Take 1 capsule (500 mg total) by mouth 4 (four) times daily. 08/31/21  Yes Margarita Grizzle, MD  dicyclomine (BENTYL) 10 MG capsule Take by mouth. 04/28/19   [provider]  fluconazole (DIFLUCAN) 150 MG tablet Take 1 tablet once for yeast infection; may repeat once in 3 days if needed 04/28/19   [provider]  ibuprofen (ADVIL) 600 MG tablet Take 1 tablet (600 mg total) by mouth every 6 (six) hours as needed. 12/30/20   Derwood Kaplan, MD  methocarbamol (ROBAXIN) 500 MG tablet Take 1 tablet (500 mg total) by mouth 2 (two) times daily. 12/30/20   Derwood Kaplan, MD  omeprazole (PRILOSEC) 20 MG capsule Take 1 capsule (20 mg total) by mouth 2 (two) times daily before a meal. 12/30/20   Derwood Kaplan, MD  oxyCODONE-acetaminophen (PERCOCET/ROXICET) 5-325 MG tablet Take 1 tablet by mouth every 6 (six) hours as needed for severe pain. 12/30/20   Derwood Kaplan, MD  pantoprazole (PROTONIX) 40 MG tablet Take by mouth. 04/28/19 05/28/19  [provider]  predniSONE (DELTASONE) 10 MG tablet Take 2 tablets (20 mg total) by mouth daily. 12/30/20   Derwood Kaplan, MD  sertraline (ZOLOFT) 25 MG tablet Take 1 tablet (25 mg total) by mouth daily. 05/21/19   Federico Flake, MD       Allergies    Sulfa antibiotics    Review of Systems   Review of Systems  Physical Exam Updated Vital Signs BP 133/81   Pulse (!) 53   Temp 98.1 F (36.7 C)   Resp 18   Ht 1.626 m (5\' 4" )   Wt 122 kg   SpO2 98%   BMI 46.17 kg/m  Physical Exam Vitals and nursing note reviewed.  Constitutional:      Appearance: She is well-developed. She is obese.  HENT:     Head: Normocephalic.     Mouth/Throat:     Mouth: Mucous membranes are moist.  Eyes:     Extraocular Movements: Extraocular movements intact.  Cardiovascular:     Rate and Rhythm: Normal rate and regular rhythm.     Heart sounds: Normal heart sounds.  Pulmonary:     Effort: Pulmonary effort is normal.     Breath sounds: Normal breath sounds.  Abdominal:     General: Abdomen is flat. Bowel sounds are normal.     Palpations: Abdomen is soft.     Tenderness: There is abdominal tenderness in the right lower quadrant, suprapubic area and left lower quadrant.  Skin:    General: Skin is warm and dry.  Neurological:     Mental Status: She is alert.     ED Results / Procedures / Treatments   Labs (all labs ordered are listed, but only abnormal results  are displayed) Labs Reviewed  COMPREHENSIVE METABOLIC PANEL - Abnormal; Notable for the following components:      Result Value   Glucose, Bld 107 (*)    AST 14 (*)    All other components within normal limits  URINALYSIS, ROUTINE W REFLEX MICROSCOPIC - Abnormal; Notable for the following components:   Hgb urine dipstick SMALL (*)    Protein, ur TRACE (*)    Leukocytes,Ua MODERATE (*)    Bacteria, UA FEW (*)    All other components within normal limits  LIPASE, BLOOD  CBC  PREGNANCY, URINE    EKG None  Radiology CT ABDOMEN PELVIS W CONTRAST  Result Date: 08/31/2021 CLINICAL DATA:  Mid to lower abdominal pain, moderate nausea EXAM: CT ABDOMEN AND PELVIS WITH CONTRAST TECHNIQUE: Multidetector CT imaging of the abdomen and pelvis was performed using the  standard protocol following bolus administration of intravenous contrast. RADIATION DOSE REDUCTION: This exam was performed according to the departmental dose-optimization program which includes automated exposure control, adjustment of the mA and/or kV according to patient size and/or use of iterative reconstruction technique. CONTRAST:  OMNIPAQUE IOHEXOL 300 MG/ML  SOLN COMPARISON:  07/27/2018 FINDINGS: Lower chest: No focal pulmonary opacity or pleural effusion. No pericardial effusion. Hepatobiliary: No focal liver abnormality is seen. No gallstones, gallbladder wall thickening, or biliary dilatation. Pancreas: Unremarkable. No pancreatic ductal dilatation or surrounding inflammatory changes. Spleen: At the upper limit of normal in size. Small low-density lesion in the inferior spleen, likely a splenic cyst, unchanged. Adrenals/Urinary Tract: The adrenal glands are unremarkable. The kidneys are symmetric in size and enhancement. Right superior renal cortical scarring similar to prior. No hydronephrosis or nephrolithiasis. The bladder is unremarkable. Stomach/Bowel: Stomach is within normal limits. Appendix is not definitively visualized. No evidence of bowel wall thickening, distention, or inflammatory changes. Mild diverticulosis without evidence of diverticulitis. Vascular/Lymphatic: No significant vascular findings are present. No enlarged abdominal or pelvic lymph nodes. Reproductive: Uterus and bilateral adnexa are unremarkable. Other: Previously noted epigastric hernia is no longer seen. No free air or free fluid in the abdomen or pelvis. Musculoskeletal: No acute osseous abnormality. Redemonstrated L1 hemangioma. Mild S shaped curvature of the thoracolumbar spine. IMPRESSION: No acute finding in the abdomen or pelvis to explain the patient's abdominal pain. Electronically Signed   By: Wiliam Ke M.D.   On: 08/31/2021 14:32    Procedures Procedures    Medications Ordered in ED Medications   iohexol (OMNIPAQUE) 300 MG/ML solution 100 mL (100 mLs Intravenous Contrast Given 08/31/21 1413)    ED Course/ Medical Decision Making/ A&P                           Medical Decision Making 36 year old female presents today with some pelvic pain pain.  She is having some vaginal bleeding which is occurring at a normal menstrual time.  She spoke with her primary care doctors and due to history of tubal ligation and concern for ectopic pregnancy Here in the ED the patient is hemodynamically stable. She has some mild tenderness in her lower quadrants and suprapubic area Differential diagnosis include, but not limited to, ectopic pregnancy, ruled out by negative pregnancy test, UTI, diverticulitis, appendicitis, TOA,  Labs essentially within normal limits CT abdomen pelvis without any evidence of acute abnormality making diverticulitis appendicitis and TIA much less likely. She does have some hemoglobin, leukocytes, bacteria on urinalysis.  Will give prescription for Keflex. Patient advised to use OTC pain medication.  She is advised of return precautions and voices understanding  Amount and/or Complexity of Data Reviewed Labs: ordered. Decision-making details documented in ED Course. Radiology: ordered and independent interpretation performed. Decision-making details documented in ED Course.  Risk Prescription drug management.           Final Clinical Impression(s) / ED Diagnoses Final diagnoses:  Pelvic pain    Rx / DC Orders ED Discharge Orders          Ordered    cephALEXin (KEFLEX) 500 MG capsule  4 times daily        08/31/21 1507              Margarita Grizzle, MD 08/31/21 [phone]

## 2021-08-31 NOTE — Discharge Instructions (Addendum)
CAT scan did not show any active symptoms of acute intra-abdominal or pelvic abnormalities including appendicitis, diverticulitis, or evidence of abscess Pregnancy test is negative You are being treated with Keflex for possible urinary tract infection Please use ibuprofen and acetaminophen as needed for pain Please return if you are having worsening symptoms or new symptoms such as fever nausea or vomiting. Recheck with your doctor next week

## 2021-09-04 ENCOUNTER — Ambulatory Visit: Payer: Medicaid Other

## 2021-09-04 DIAGNOSIS — R262 Difficulty in walking, not elsewhere classified: Secondary | ICD-10-CM | POA: Diagnosis not present

## 2021-09-04 DIAGNOSIS — R252 Cramp and spasm: Secondary | ICD-10-CM | POA: Diagnosis not present

## 2021-09-04 DIAGNOSIS — M542 Cervicalgia: Secondary | ICD-10-CM

## 2021-09-04 DIAGNOSIS — R200 Anesthesia of skin: Secondary | ICD-10-CM | POA: Diagnosis not present

## 2021-09-04 DIAGNOSIS — R293 Abnormal posture: Secondary | ICD-10-CM | POA: Diagnosis not present

## 2021-09-04 DIAGNOSIS — M419 Scoliosis, unspecified: Secondary | ICD-10-CM | POA: Diagnosis not present

## 2021-09-04 DIAGNOSIS — M6281 Muscle weakness (generalized): Secondary | ICD-10-CM | POA: Diagnosis not present

## 2021-09-04 DIAGNOSIS — R202 Paresthesia of skin: Secondary | ICD-10-CM | POA: Diagnosis not present

## 2021-09-04 NOTE — Therapy (Signed)
OUTPATIENT PHYSICAL THERAPY TREATMENT  NOTE   Patient Name: Declan Adamson Strawser MRN: [id number] DOB:Jul 05, 1985, 36 y.o., female Today's Date: 09/04/2021  PCP: Dr Gorden Harms per report REFERRING PROVIDER: Laroy Apple, MD   END OF SESSION:   PT End of Session - 09/04/21 1020     Visit Number 11    Number of Visits 10    Date for PT Re-Evaluation 09/21/21    Authorization Type submitted for further appts on 09/04/21    Authorization Time Period 07/27/2021-09/25/2021    Authorization - Visit Number 11    Authorization - Number of Visits 10    PT Start Time 6789    PT Stop Time 1108    PT Time Calculation (min) 53 min    Activity Tolerance Patient tolerated treatment well;Patient limited by pain    Behavior During Therapy [facility] for tasks assessed/performed             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   Hx of varicella    Kidney stones    Klippel Trenaunay syndrome    Scoliosis    Scoliosis    Umbilical hernia    Past Surgical History:  Procedure Laterality Date   CESAREAN SECTION  04/07/2012   Procedure: CESAREAN SECTION;  Surgeon: Linda Hedges, DO;  Location: [facility];  Service: Obstetrics;  Laterality: N/A;  Primary Cesarean Section Delivery Baby  Boy  @ [phone],   Apgars 9/9   CESAREAN SECTION N/A 07/13/2018   Procedure: CESAREAN SECTION;  Surgeon: Sloan Leiter, MD;  Location: [facility];  Service: Obstetrics;  Laterality: N/A;   EAR CANALOPLASTY     LEG SURGERY     MANDIBLE RECONSTRUCTION     TONSILLECTOMY     TUBAL LIGATION  07/13/2018   Procedure: BILATERAL TUBAL LIGATION;  Surgeon: Sloan Leiter, MD;  Location: [facility];  Service: Obstetrics;;   VENTRAL HERNIA REPAIR N/A 10/09/2018   Procedure: LAPAROSCOPIC ASSISTED REPAIR OF EPIGASTRIC HERNIA  WITH  MESH;  Surgeon: Fanny Skates, MD;  Location: [facility];  Service: General;  Laterality: N/A;   Patient Active Problem List   Diagnosis Date Noted   Obesity 05/07/2019   Ventral hernia 10/09/2018   H/O: C-section 07/13/2018   Abdominal wall hernia 01/10/2018   Klippel Trenaunay syndrome 01/07/2018   CHEST PAIN 11/18/2006    REFERRING DIAG: Right Cervical Radicular Pain with C6-C7 Disc Bulge  THERAPY DIAG:  Cervicalgia  Muscle weakness (generalized)  Cramp and spasm  Abnormal posture  PERTINENT HISTORY: Klippel Trenaunay Syndrome, Scoliosis, Ear Malformation with multiple surgeries  PRECAUTIONS: none  SUBJECTIVE: Pt reports that she is having more numbness today.  I haven't taken the Naproxen yet because I haven't eaten.    PAIN:  Are you having pain? Yes: NPRS scale: 5/10 Pain location: right elbow Pain description: aching Aggravating factors: nothing in particular Relieving factors: rest  Patient has very minimal pain when taking Naproxen but without Naproxen, she continues to experience 9/10 pain when not taking meds.    OBJECTIVE: (objective measures completed at initial evaluation unless otherwise dated)  DIAGNOSTIC FINDINGS:  CT Scan 12/30/2020:  Mild degenerative  changes at C6-C7 with a prominent Schmorl's node indenting the superior C7 endplate. No evidence of spinal canal or neural foraminal stenosis.   PATIENT SURVEYS:  FOTO 51% at initial evaluation (anticipated 62% by visit 10) (08/30/21 53%)     COGNITION: Overall cognitive status: Within functional limits for tasks assessed     SENSATION: Light touch: WFL (now having numbness bilateral hands fairly consistently with driving, mowing grass, holding her daughter, she has not been able to work)   POSTURE:  Forward head, rounded shoulders, scoliosis   PALPATION: Tightness noted along cervical region and upper traps, palpable trigger points   (08/30/21 left upper trap and parascapular areas much  improved, right upper trap and parascapular area still quite restricted and with trigger points)           CERVICAL ROM:    Active ROM A/ROM (deg) 07/27/2021 A/ROM (deg) 08/30/2021 With Naproxen  Flexion 45 45  Extension 30 35  Right lateral flexion 32 25  Left lateral flexion 24 25  Right rotation 40 40  Left rotation 40 45   (Blank rows = not tested)   UE ROM: WFL with pain at end range   UE MMT: 07/27/2021:  Pt with RUE strength grossly 4-/5, LUE strength grossly 4/5  (no changes on re-assessment on 08/30/21, bilateral ER shoulder ER still 4-/5)   FUNCTIONAL TESTS:  07/27/2021:  5 times sit to stand: 12.3 sec without use of UE  (08/30/21 11.3 sec)   PATIENT SURVEYS:  07/27/2021: Mikael Spray Dash 61.36  08/30/21 (Quick Dash 54.5%)    TODAY'S TREATMENT:   09/04/2021: UBE x 5 min (2.5 min fwd and 2.5 min rev) TYI's over physio ball 1# 2 x 10  Wall push ups: elbows tucked (more like tricep push up) 2 x 10 Plank push ups edge of raised treatment table 2 x 10 with 20 shoulder rolls after each set Lat pull down standing x 20 with 40 lbs Seated chin tucks 2x15 Seated prayer stretch 5 x 10 sec Moist heat to thoracic and cervical spine with interferrential estim to bilateral upper traps and parascapular areas.  X 12 min  08/30/21: Re-assessment for additional visits Moist heat to thoracic and cervical spine with interferrential estim to bilateral upper traps and parascapular areas.  X 10 min  08/28/2021: UBE x 5 min (2.5 min fwd and 2.5 min rev) TYI's over physio ball 0# I's with thumbs up over physio ball x 20 Y's with thumbs up over physio ball x 20 with occasional rest breaks to complete all 20 T's with thumbs up x 20 over physio ball.   Plank push ups edge of raised treatment table 2 x 10 with 20 shoulder rolls after each set Seated chin tucks 2x15 Seated prayer stretch 5 x 10 sec Manual therapy in supine for soft tissue mobilization, suboccipital release, and manual traction to  cervical region, left sternocleidomastoid, left temoralis and masseter.  Cervical PROM performed in supine. Moist heat to thoracic and cervical spine with interferrential estim to bilateral upper traps and parascapular areas.  X 10 min  08/23/2021: NuStep (UBE occupied) x 5 min level 4 with PT present to discuss status and plan Seated:  scapular retraction, posterior shoulder rolls.  2x15 Seated prayer stretch 5 x 10 sec Seated chin tucks 2x15 Manual therapy in supine for soft tissue mobilization, suboccipital release, and manual traction to cervical region, left sternocleidomastoid, left temoralis and masseter.  Cervical PROM performed in supine. Moist heat to thoracic and cervical  spine with interferrential estim to bilateral upper traps and parascapular areas.  X 10 min    PATIENT EDUCATION:  Education details: Issued HEP Person educated: Patient Education method: Explanation, Demonstration, and Handouts Education comprehension: verbalized understanding and returned demonstration     HOME EXERCISE PROGRAM: Access Code: [email or web address] URL: [email or web address] Date: 08/07/2021 Prepared by: Candyce Churn  Exercises - Seated Cervical Rotation AROM  - 1 x daily - 7 x weekly - 2 sets - 10 reps - Seated Cervical Flexion AROM  - 1 x daily - 7 x weekly - 2 sets - 10 reps - Seated Cervical Extension AROM  - 1 x daily - 7 x weekly - 2 sets - 10 reps - Seated Scapular Retraction  - 1 x daily - 7 x weekly - 2 sets - 10 reps - Seated Cervical Retraction  - 1 x daily - 7 x weekly - 2 sets - 10 reps - Seated Levator Scapulae Stretch  - 1 x daily - 7 x weekly - 1 sets - 3 reps - 20 sec hold - Seated Upper Trapezius Stretch  - 1 x daily - 7 x weekly - 1 sets - 3 reps - 20 sec hold - Prone Shoulder Extension - Single Arm  - 2 x daily - 7 x weekly - 2 sets - 10 reps - Prone Shoulder Row  - 2 x daily - 7 x weekly - 2 sets - 10 reps - Prone Single Arm Shoulder Horizontal Abduction with  Scapular Retraction and Palm Down  - 2 x daily - 7 x weekly - 2 sets - 10 reps - Sidelying Shoulder External Rotation  - 2 x daily - 7 x weekly - 2 sets - 10 reps - Single Arm Serratus Punches in Supine with Dumbbell  - 2 x daily - 7 x weekly - 2 sets - 10 reps - Standing Shoulder Flexion to 90 Degrees with Dumbbells  - 2 x daily - 7 x weekly - 2 sets - 10 reps - Standing Shoulder Scaption  - 2 x daily - 7 x weekly - 2 sets - 10 reps   ASSESSMENT:   CLINICAL IMPRESSION: Jadon is progressing slowly but continues to have radicular symptoms.  We are working diligently on her postural awareness and strength as well as shoulder strength and scapular stabilization.  She is able to complete TYI's with minimal fatigue today with addition of 1 lb hand weights.   Pt continues to require skilled PT to progress towards goal related activities.     OBJECTIVE IMPAIRMENTS decreased activity tolerance, decreased ROM, decreased strength, increased muscle spasms, impaired flexibility, impaired UE functional use, postural dysfunction, and pain.    ACTIVITY LIMITATIONS cleaning, community activity, driving, and occupation.    PERSONAL FACTORS 1 comorbidity: scoliosis  are also affecting patient's functional outcome.      REHAB POTENTIAL: Good   CLINICAL DECISION MAKING: Evolving/moderate complexity   EVALUATION COMPLEXITY: Low     GOALS: Goals reviewed with patient? Yes   SHORT TERM GOALS: Target date: 08/17/2021     Pt will be independent with in initial HEP. Baseline:  Goal status: Goal Met 08/16/2021   2.  Pt will report at least a 40% improvement in symptoms since starting PT. Baseline:  Goal status: Ongoing   LONG TERM GOALS: Target date: 09/21/2021    Pt will be independent with advanced HEP. Baseline:  Goal status: On going   2.  Pt will increase FOTO to at least 62% to  demonstrate improvements in functional mobility. Baseline: 51% Goal status: In progress   3.  Pt will report being  able to return to sewing and making jewelry with pain no greater than 4/10. Baseline: 8/10 Goal status: In progress   4.  Pt will increase bilat UE muscle strength to at least 4+/5 to allow her to lift groceries. Baseline: RUE 4-/5, LUE 4/5 Goal status: In progress   PLAN: PT FREQUENCY: 2x/week   PT DURATION: 8 weeks   PLANNED INTERVENTIONS: Therapeutic exercises, Therapeutic activity, Neuromuscular re-education, Balance training, Gait training, Patient/Family education, Joint manipulation, Joint mobilization, Aquatic Therapy, Dry Needling, Electrical stimulation, Spinal manipulation, Spinal mobilization, Cryotherapy, Moist heat, Taping, Vasopneumatic device, Traction, Ultrasound, Ionotophoresis 63m/ml Dexamethasone, and Manual therapy   PLAN FOR NEXT SESSION:  Continue postural strengthening, manual techniques and stim/heat if symptoms persist,  progress and assess HEP, strengthening, flexibility, traction and manual therapy as indicated, progress right rotator cuff strengthening and scapular stabilization.    JAnderson MaltaB. Rulon Abdalla, PT 09/04/21 1:32 PM   BBanner Del E. Webb Medical CenterSpecialty Rehab Services [address] [place] [place] [address]Phone # [phone] [phone]

## 2021-09-06 ENCOUNTER — Ambulatory Visit: Payer: Medicaid Other

## 2021-09-06 DIAGNOSIS — M542 Cervicalgia: Secondary | ICD-10-CM

## 2021-09-06 DIAGNOSIS — R252 Cramp and spasm: Secondary | ICD-10-CM | POA: Diagnosis not present

## 2021-09-06 DIAGNOSIS — R202 Paresthesia of skin: Secondary | ICD-10-CM | POA: Diagnosis not present

## 2021-09-06 DIAGNOSIS — M419 Scoliosis, unspecified: Secondary | ICD-10-CM | POA: Diagnosis not present

## 2021-09-06 DIAGNOSIS — R293 Abnormal posture: Secondary | ICD-10-CM | POA: Diagnosis not present

## 2021-09-06 DIAGNOSIS — M6281 Muscle weakness (generalized): Secondary | ICD-10-CM | POA: Diagnosis not present

## 2021-09-06 DIAGNOSIS — R262 Difficulty in walking, not elsewhere classified: Secondary | ICD-10-CM | POA: Diagnosis not present

## 2021-09-06 DIAGNOSIS — R200 Anesthesia of skin: Secondary | ICD-10-CM | POA: Diagnosis not present

## 2021-09-06 NOTE — Therapy (Addendum)
OUTPATIENT PHYSICAL THERAPY TREATMENT  NOTE AND LATE ENTRY DISCHARGE SUMMARY   Patient Name: Mariah Weaver MRN: [id number] DOB:Sep 04, 1985, 36 y.o., female Today's Date: 09/06/2021  PCP: Dr Gorden Harms per report REFERRING PROVIDER: Laroy Apple, MD   END OF SESSION:   PT End of Session - 09/06/21 1105     Visit Number 12    Number of Visits 12    Date for PT Re-Evaluation 09/06/21    Authorization Type submitted for further appts on 09/06/21    Authorization Time Period rad md approved 2 visits 5/12-11/24/2021 auth#[id number]  [facility] Medicaid    Authorization - Visit Number 12    Authorization - Number of Visits 12    PT Start Time 1100    PT Stop Time 1145    PT Time Calculation (min) 45 min    Activity Tolerance Patient tolerated treatment well;Patient limited by pain    Behavior During Therapy [facility] for tasks assessed/performed             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   Hx of varicella    Kidney stones    Klippel Trenaunay syndrome    Scoliosis    Scoliosis    Umbilical hernia    Past Surgical History:  Procedure Laterality Date   CESAREAN SECTION  04/07/2012   Procedure: CESAREAN SECTION;  Surgeon: Linda Hedges, DO;  Location: [place] [facility];  Service: Obstetrics;  Laterality: N/A;  Primary Cesarean Section Delivery Baby  Boy  @ [phone],   Apgars 9/9   CESAREAN SECTION N/A 07/13/2018   Procedure: CESAREAN SECTION;  Surgeon: Sloan Leiter, MD;  Location: [facility];  Service: Obstetrics;  Laterality: N/A;   EAR CANALOPLASTY     LEG SURGERY     MANDIBLE RECONSTRUCTION     TONSILLECTOMY     TUBAL LIGATION  07/13/2018   Procedure: BILATERAL TUBAL LIGATION;  Surgeon: Sloan Leiter, MD;  Location: [facility];  Service: Obstetrics;;   VENTRAL  HERNIA REPAIR N/A 10/09/2018   Procedure: LAPAROSCOPIC ASSISTED REPAIR OF EPIGASTRIC HERNIA WITH  MESH;  Surgeon: Fanny Skates, MD;  Location: [facility];  Service: General;  Laterality: N/A;   Patient Active Problem List   Diagnosis Date Noted   Obesity 05/07/2019   Ventral hernia 10/09/2018   H/O: C-section 07/13/2018   Abdominal wall hernia 01/10/2018   Klippel Trenaunay syndrome 01/07/2018   CHEST PAIN 11/18/2006    REFERRING DIAG: Right Cervical Radicular Pain with C6-C7 Disc Bulge  THERAPY DIAG:  Cervicalgia  Muscle weakness (generalized)  Cramp and spasm  Abnormal posture  PERTINENT HISTORY: Klippel Trenaunay Syndrome, Scoliosis, Ear Malformation with multiple surgeries  PRECAUTIONS: none  SUBJECTIVE: Pt reports that she is having continued hand numbness.  Appt with MD is not until July.  Insurance only approved 2 addl visits which is used up as of today.     PAIN:  Are you having pain? Yes: NPRS scale: 5/10 Pain location: right elbow Pain description: aching Aggravating factors: nothing in particular Relieving factors: rest  Patient has very minimal pain when taking Naproxen but without Naproxen, she continues to experience 9/10 pain when  not taking meds.    OBJECTIVE: (objective measures completed at initial evaluation unless otherwise dated)  DIAGNOSTIC FINDINGS:  CT Scan 12/30/2020:  Mild degenerative changes at C6-C7 with a prominent Schmorl's node indenting the superior C7 endplate. No evidence of spinal canal or neural foraminal stenosis.   PATIENT SURVEYS:  FOTO 51% at initial evaluation (anticipated 62% by visit 10) (08/30/21 53%) (09/06/21 43%)     COGNITION: Overall cognitive status: Within functional limits for tasks assessed     SENSATION: Light touch: WFL 08/30/21 (now having numbness bilateral hands fairly consistently with driving, mowing grass, holding her daughter, she has not been able to work)  09/06/21 continued numbness bilateral hands right  > left but occasionally both hands, entire hand, no specific dermatomal pattern with grip strength loss    POSTURE:  Forward head, rounded shoulders, scoliosis   PALPATION: Tightness noted along cervical region and upper traps, palpable trigger points   (08/30/21 left upper trap and parascapular areas much improved, right upper trap and parascapular area still quite restricted and with trigger points)           CERVICAL ROM:    Active ROM A/ROM (deg) 07/27/2021 A/ROM (deg) 08/30/2021 With Naproxen A/ROM (deg) 09/06/2021 With Naproxen  Flexion 45 45 30  Extension 30 35 15  Right lateral flexion 32 25 20  Left lateral flexion '24 25 22  ' Right rotation 40 40 25  Left rotation 40 45 40   (Blank rows = not tested)   UE ROM: WFL with pain at end range   UE MMT: 07/27/2021:  Pt with RUE strength grossly 4-/5, LUE strength grossly 4/5  (no changes on re-assessment on 08/30/21, bilateral ER shoulder ER still 4-/5 09/06/21:  Right UE generally4-/5,  Left UE generally 4/5,  bilateral shoulder ER improved to 4/5   FUNCTIONAL TESTS:  07/27/2021:  5 times sit to stand: 12.3 sec without use of UE  (08/30/21 11.3 sec)   PATIENT SURVEYS:  07/27/2021: Mikael Spray Dash 61.36  08/30/21 (Quick Dash 54.5%) 09/06/21 Quick Dash 50%   TODAY'S TREATMENT:  09/06/21: Re-assessment for insurance  Reviewed HEP  09/04/2021: UBE x 5 min (2.5 min fwd and 2.5 min rev) TYI's over physio ball 1# 2 x 10  Wall push ups: elbows tucked (more like tricep push up) 2 x 10 Plank push ups edge of raised treatment table 2 x 10 with 20 shoulder rolls after each set Lat pull down standing x 20 with 40 lbs Seated chin tucks 2x15 Seated prayer stretch 5 x 10 sec Moist heat to thoracic and cervical spine with interferrential estim to bilateral upper traps and parascapular areas.  X 12 min  08/30/21: Re-assessment for additional visits Moist heat to thoracic and cervical spine with interferrential estim to bilateral upper traps and  parascapular areas.  X 10 min  08/28/2021: UBE x 5 min (2.5 min fwd and 2.5 min rev) TYI's over physio ball 0# I's with thumbs up over physio ball x 20 Y's with thumbs up over physio ball x 20 with occasional rest breaks to complete all 20 T's with thumbs up x 20 over physio ball.   Plank push ups edge of raised treatment table 2 x 10 with 20 shoulder rolls after each set Seated chin tucks 2x15 Seated prayer stretch 5 x 10 sec Manual therapy in supine for soft tissue mobilization, suboccipital release, and manual traction to cervical region, left sternocleidomastoid, left temoralis and masseter.  Cervical PROM performed in supine. Moist heat  to thoracic and cervical spine with interferrential estim to bilateral upper traps and parascapular areas.  X 10 min    PATIENT EDUCATION:  Education details: Issued HEP Person educated: Patient Education method: Explanation, Demonstration, and Handouts Education comprehension: verbalized understanding and returned demonstration     HOME EXERCISE PROGRAM: Access Code: [email or web address] URL: [email or web address] Date: 09/06/2021 Prepared by: Candyce Churn  Exercises - Seated Cervical Rotation AROM  - 1 x daily - 7 x weekly - 2 sets - 10 reps - Seated Cervical Flexion AROM  - 1 x daily - 7 x weekly - 2 sets - 10 reps - Seated Cervical Extension AROM  - 1 x daily - 7 x weekly - 2 sets - 10 reps - Seated Scapular Retraction  - 1 x daily - 7 x weekly - 2 sets - 10 reps - Seated Cervical Retraction  - 1 x daily - 7 x weekly - 2 sets - 10 reps - Seated Levator Scapulae Stretch  - 1 x daily - 7 x weekly - 1 sets - 3 reps - 20 sec hold - Seated Upper Trapezius Stretch  - 1 x daily - 7 x weekly - 1 sets - 3 reps - 20 sec hold - Prone Shoulder Extension - Single Arm  - 2 x daily - 7 x weekly - 2 sets - 10 reps - Prone Shoulder Row  - 2 x daily - 7 x weekly - 2 sets - 10 reps - Prone Single Arm Shoulder Horizontal Abduction with Scapular  Retraction and Palm Down  - 2 x daily - 7 x weekly - 2 sets - 10 reps - Sidelying Shoulder External Rotation  - 2 x daily - 7 x weekly - 2 sets - 10 reps - Single Arm Serratus Punches in Supine with Dumbbell  - 2 x daily - 7 x weekly - 2 sets - 10 reps - Standing Shoulder Flexion to 90 Degrees with Dumbbells  - 2 x daily - 7 x weekly - 2 sets - 10 reps - Standing Shoulder Scaption  - 2 x daily - 7 x weekly - 2 sets - 10 reps - Shoulder extension with resistance - Neutral  - 1 x daily - 7 x weekly - 3 sets - 10 reps - Standing Shoulder Row with Anchored Resistance  - 1 x daily - 7 x weekly - 3 sets - 10 reps - Shoulder External Rotation and Scapular Retraction with Resistance  - 1 x daily - 7 x weekly - 3 sets - 10 reps - Standing Shoulder Horizontal Abduction with Resistance  - 1 x daily - 7 x weekly - 3 sets - 10 reps ASSESSMENT:   CLINICAL IMPRESSION: Valli is meeting a plateau.  She does not have f/u with MD until July.  She has a comprehensive HEP that she can follow at home.  This was reviewed diligently today.  We discussed need to focus on these exercises and be attentive to her posture until she sees MD again.  Due to lack of progress and occasional decline in function, we will place patient on hold vs request additional visits to allow patient to f/u with MD and see what course of treatment will be suggested.    OBJECTIVE IMPAIRMENTS decreased activity tolerance, decreased ROM, decreased strength, increased muscle spasms, impaired flexibility, impaired UE functional use, postural dysfunction, and pain.    ACTIVITY LIMITATIONS cleaning, community activity, driving, and occupation.    PERSONAL FACTORS 1 comorbidity: scoliosis  are also  affecting patient's functional outcome.      REHAB POTENTIAL: Good   CLINICAL DECISION MAKING: Evolving/moderate complexity   EVALUATION COMPLEXITY: Low     GOALS: Goals reviewed with patient? Yes   SHORT TERM GOALS: Target date: 08/17/2021      Pt will be independent with in initial HEP. Baseline:  Goal status: Goal Met 08/16/2021   2.  Pt will report at least a 40% improvement in symptoms since starting PT. Baseline:  Goal status: Ongoing   LONG TERM GOALS: Target date: 09/21/2021    Pt will be independent with advanced HEP. Baseline:  Goal status: On going   2.  Pt will increase FOTO to at least 62% to demonstrate improvements in functional mobility. Baseline: 51% Goal status: In progress   3.  Pt will report being able to return to sewing and making jewelry with pain no greater than 4/10. Baseline: 8/10 Goal status: In progress   4.  Pt will increase bilat UE muscle strength to at least 4+/5 to allow her to lift groceries. Baseline: RUE 4-/5, LUE 4/5 Goal status: In progress   PLAN: PT FREQUENCY: 2x/week   PT DURATION: 8 weeks   PLANNED INTERVENTIONS: Therapeutic exercises, Therapeutic activity, Neuromuscular re-education, Balance training, Gait training, Patient/Family education, Joint manipulation, Joint mobilization, Aquatic Therapy, Dry Needling, Electrical stimulation, Spinal manipulation, Spinal mobilization, Cryotherapy, Moist heat, Taping, Vasopneumatic device, Traction, Ultrasound, Ionotophoresis 69m/ml Dexamethasone, and Manual therapy   PLAN FOR NEXT SESSION:  Hold until f/u with MD.  We will proceed based on MD suggestions.      PHYSICAL THERAPY DISCHARGE SUMMARY  As of 10/10/2021, pt hs not returned for further appointments after cancelling last appointments to follow up with MD.  Pt discharged at this time.  Patient agrees to discharge. Patient goals were partially met. Patient is being discharged due to not returning since the last visit. SJuel Burrow PT, DPT 10/10/2021, 10:55 AM      JMarveen Reeks Carlethia Mesquita, PT 09/06/21 2:07 PM  [address] [place] [place] [address]Phone # [phone] [phone]

## 2021-09-11 ENCOUNTER — Ambulatory Visit: Payer: Medicaid Other

## 2021-09-13 ENCOUNTER — Ambulatory Visit: Payer: Medicaid Other

## 2021-10-05 ENCOUNTER — Encounter: Payer: Self-pay | Admitting: Obstetrics and Gynecology

## 2021-10-05 ENCOUNTER — Ambulatory Visit (INDEPENDENT_AMBULATORY_CARE_PROVIDER_SITE_OTHER): Payer: Medicaid Other | Admitting: Obstetrics and Gynecology

## 2021-10-05 DIAGNOSIS — N946 Dysmenorrhea, unspecified: Secondary | ICD-10-CM | POA: Diagnosis not present

## 2021-10-05 MED ORDER — IBUPROFEN 800 MG PO TABS: 800.0000 mg | ORAL_TABLET | Freq: Three times a day (TID) | ORAL | 3 refills | Status: DC | PRN

## 2021-10-05 MED ORDER — IBUPROFEN 800 MG PO TABS: 800.0000 mg | ORAL_TABLET | Freq: Three times a day (TID) | ORAL | 3 refills | Status: AC | PRN

## 2021-10-05 NOTE — Progress Notes (Signed)
  CC: pain with menses Subjective:    Patient ID: Mariah Weaver, female    DOB: 1985/05/21, 36 y.o.   MRN: [id number]  HPI 36 yo G2P2 seen for discussion of painful menses.  Pt wants hysterectomy, but has been advised she needs workup and less invasive treatment modalities first.  She notes regular menses that last 2-5 days.  She has light to moderate flow.  She has noted worsening pain with menses over the last two months.  She has only tried tylenol for pain control with little effect.  She denies nausea/vomiting, constipation/diarrhea.  She denies true dyspareunia, only notes mild discomfort with initial penetration.She has occasional dysuria, but also notes frequent UTI.   Review of Systems  Genitourinary:  Positive for dysuria and pelvic pain. Negative for dyspareunia, menstrual problem and vaginal bleeding.       Objective:   Physical Exam Vitals reviewed.  Constitutional:      Appearance: Normal appearance. She is obese.  HENT:     Head: Normocephalic and atraumatic.  Cardiovascular:     Rate and Rhythm: Normal rate and regular rhythm.     Heart sounds: Normal heart sounds.  Pulmonary:     Effort: Pulmonary effort is normal.     Breath sounds: Normal breath sounds.  Abdominal:     General: Abdomen is flat. There is no distension.     Palpations: Abdomen is soft. There is no mass.     Tenderness: There is no abdominal tenderness. There is no guarding.  Neurological:     Mental Status: She is alert.   Pt declined pap smear Vitals:   10/05/21 1108  BP: 124/68  Pulse: (!) 50         Assessment & Plan:   1. Dysmenorrhea Symptoms reflect secondary dysmenorrhea Trial of high dose ibuprofen the first 48-72 hours of menses Can consider OCP, normal or extended regimen, possible orlissa/oriahnn Check pelvic ultrasound Pap and pelvic exam at follow up exam in 5 weeks.  - US PELVIC COMPLETE WITH TRANSVAGINAL; Future - ibuprofen (ADVIL) 800 MG tablet; Take 1 tablet (800  mg total) by mouth 3 (three) times daily with meals as needed for headache, moderate pain or cramping.  Dispense: 30 tablet; Refill: 3    Warden Fillers, MD Faculty Attending, Center for [facility]

## 2021-10-05 NOTE — Progress Notes (Signed)
NGYN presents to establish care. Pt c/o irregular cycles and painful periods. Pain is "new and different from normal menstrual pain" and pt is considering a hysterectomy.   Pt declines PAP today. Last PAP: 03/2015 Tubal ligation 06/2018  Pt interested in consult for hysterectomy.

## 2021-10-17 ENCOUNTER — Ambulatory Visit
Admission: RE | Admit: 2021-10-17 | Discharge: 2021-10-17 | Disposition: A | Discharge status: Home / Self Care | Payer: Medicaid Other | Source: Ambulatory Visit | Attending: Obstetrics and Gynecology | Admitting: Obstetrics and Gynecology

## 2021-10-17 DIAGNOSIS — N946 Dysmenorrhea, unspecified: Secondary | ICD-10-CM | POA: Insufficient documentation

## 2021-11-08 ENCOUNTER — Ambulatory Visit (INDEPENDENT_AMBULATORY_CARE_PROVIDER_SITE_OTHER): Payer: Medicaid Other | Admitting: Obstetrics and Gynecology

## 2021-11-08 VITALS — BP 120/72 | HR 58 | Wt 270.0 lb

## 2021-11-08 DIAGNOSIS — M5412 Radiculopathy, cervical region: Secondary | ICD-10-CM | POA: Diagnosis not present

## 2021-11-08 DIAGNOSIS — N946 Dysmenorrhea, unspecified: Secondary | ICD-10-CM | POA: Diagnosis not present

## 2021-11-08 DIAGNOSIS — R102 Pelvic and perineal pain unspecified side: Secondary | ICD-10-CM | POA: Insufficient documentation

## 2021-11-08 DIAGNOSIS — G5603 Carpal tunnel syndrome, bilateral upper limbs: Secondary | ICD-10-CM | POA: Diagnosis not present

## 2021-11-08 MED ORDER — ELAGOLIX SODIUM 150 MG PO TABS: 1.0000 | ORAL_TABLET | Freq: Every day | ORAL | 8 refills | Status: AC

## 2021-11-08 NOTE — Progress Notes (Signed)
Pt is in office for u/s results and plan. Pt states she had recent cycle and pain is increasing.

## 2021-11-09 ENCOUNTER — Encounter: Payer: Self-pay | Admitting: Neurology

## 2021-11-09 ENCOUNTER — Other Ambulatory Visit: Payer: Self-pay

## 2021-11-09 DIAGNOSIS — R202 Paresthesia of skin: Secondary | ICD-10-CM

## 2021-11-09 NOTE — Progress Notes (Signed)
  CC: ultrasound follow up Subjective:    Patient ID: Mariah Weaver, female    DOB: Aug 27, 1985, 36 y.o.   MRN: [id number]  HPI 37 yo G2P2, c/s x2, seen for ultrasound follow up.  Pt notes some improvement in dyspareunia and menstrual pain with NSAIDs.  Pain decreased from 10 to a 6.  Ultrasound reviewed and no fibroids or other pathology noted.  Pt again refused pap smear today even though she is overdue.   Review of Systems     Objective:   Physical Exam Vitals:   11/08/21 1353  BP: 120/72  Pulse: (!) 58   CLINICAL DATA:  Painful menses for 2 months   EXAM: TRANSABDOMINAL AND TRANSVAGINAL ULTRASOUND OF PELVIS   TECHNIQUE: Both transabdominal and transvaginal ultrasound examinations of the pelvis were performed. Transabdominal technique was performed for global imaging of the pelvis including uterus, ovaries, adnexal regions, and pelvic cul-de-sac. It was necessary to proceed with endovaginal exam following the transabdominal exam to visualize the endometrium and ovaries.   COMPARISON:  None Available.   FINDINGS: Uterus   Measurements: 11.8 x 6.5 x 8.6 cm = volume: 346 mL. No fibroids or other mass visualized.   Endometrium   Thickness: 5 mm.  No focal abnormality visualized.   Right ovary   Measurements: 5.1 x 2.9 x 3.7 cm = volume: 28.7 mL. Normal appearance/no adnexal mass.   Left ovary   Not visualized.   Other findings   No abnormal free fluid.   IMPRESSION: 1. The left ovary is not visualized. 2. The uterus, endometrium, and right ovary are unremarkable.      Assessment & Plan:   1. Dysmenorrhea Continue NSAIDs, will add Oriahnn to address pelvic pain and dysmenorrhea. - Elagolix Sodium 150 MG TABS; Take 1 tablet by mouth daily.  Dispense: 30 tablet; Refill: 8  2. Pelvic pain in female See above - Elagolix Sodium 150 MG TABS; Take 1 tablet by mouth daily.  Dispense: 30 tablet; Refill: 8   I spent 20 minutes dedicated to the care of this  patient including previsit review of records, face to face time with the patient discussing diagnostic findings, treatment options and post visit testing.  F/u in 1-2 months for annual exam and pap   Warden Fillers, MD Faculty Attending, Center for [facility]

## 2021-12-24 ENCOUNTER — Ambulatory Visit: Payer: Medicaid Other | Admitting: Neurology

## 2021-12-24 DIAGNOSIS — R202 Paresthesia of skin: Secondary | ICD-10-CM | POA: Diagnosis not present

## 2021-12-24 DIAGNOSIS — G5601 Carpal tunnel syndrome, right upper limb: Secondary | ICD-10-CM

## 2021-12-24 NOTE — Procedures (Signed)
  [facility]  [address]  [place], [place] [address] Tel: [phone] Fax: [phone] Test Date:  25/10/5275  Patient: Mariah Weaver DOB: [birth year] Physician: Kai Levins, MD  Sex: Female Height: 5' 4.5" Ref Phys: Laroy Apple, MD  ID#: [id number]   Technician:    History: This is a 36 year old female with neck and bilateral hand pain.  NCV & EMG Findings: This is a limited electrodiagnostic evaluation of the right upper extremity. Patient could not tolerate nerve conduction studies and asked for study to be terminated briefly after beginning study. Findings include: Prolonged distal peak latency of the right median sensory response (3.7 ms) with normal amplitude. Right ulnar sensory response is within normal limits. Right median motor response is within normal limits.  Impression: Limited evaluation of the right upper extremity is abnormal. There is evidence of a right median mononeuropathy at or distal to the wrist, consistent with carpal tunnel syndrome, mild in degree electrically. Due to patient discomfort, study was ended early before further evaluation could be made.    ___________________________ Kai Levins, MD    Nerve Conduction Studies Motor Nerve Results    Latency Amplitude F-Lat Segment Distance CV Comment  Site (ms) Norm (mV) Norm (ms)  (cm) (m/s) Norm   Right Median (APB) Motor  Wrist 3.9  < 3.9 6.7  > 6.0        Elbow 7.8 - 6.3 -  Elbow-Wrist - -  > 50    Sensory Sites    Neg Peak Lat Amplitude (O-P) Segment Distance Velocity Comment  Site (ms) Norm (V) Norm  (cm) (ms)   Right Median Sensory  Wrist-Dig II *3.7  < 3.4 24  > 20 Wrist-Dig II 13    Right Radial Sensory  Forearm-Wrist 2.1  < 2.7 26  > 18 Forearm-Wrist 10    Right Ulnar Sensory  Wrist-Dig V 2.6  < 3.1 29  > 12 Wrist-Dig V 11        Waveforms:  Motor    Sensory

## 2022-01-08 ENCOUNTER — Ambulatory Visit: Payer: Medicaid Other | Admitting: Advanced Practice Midwife

## 2022-01-15 ENCOUNTER — Telehealth: Payer: Self-pay

## 2022-01-16 DIAGNOSIS — R200 Anesthesia of skin: Secondary | ICD-10-CM | POA: Diagnosis not present

## 2022-01-16 DIAGNOSIS — M79642 Pain in left hand: Secondary | ICD-10-CM | POA: Diagnosis not present

## 2022-01-16 DIAGNOSIS — M79641 Pain in right hand: Secondary | ICD-10-CM | POA: Diagnosis not present

## 2022-01-17 ENCOUNTER — Ambulatory Visit: Payer: Medicaid Other | Admitting: Obstetrics

## 2022-01-21 DIAGNOSIS — M5412 Radiculopathy, cervical region: Secondary | ICD-10-CM | POA: Diagnosis not present

## 2022-06-20 ENCOUNTER — Encounter: Payer: Self-pay | Admitting: Obstetrics & Gynecology

## 2022-06-20 ENCOUNTER — Ambulatory Visit: Payer: Medicaid Other | Admitting: Obstetrics & Gynecology

## 2022-06-20 VITALS — BP 127/91 | HR 70 | Wt 265.0 lb

## 2022-06-20 DIAGNOSIS — R102 Pelvic and perineal pain: Secondary | ICD-10-CM

## 2022-06-20 DIAGNOSIS — G8929 Other chronic pain: Secondary | ICD-10-CM | POA: Diagnosis not present

## 2022-06-20 DIAGNOSIS — N946 Dysmenorrhea, unspecified: Secondary | ICD-10-CM

## 2022-06-20 NOTE — Progress Notes (Signed)
GYNECOLOGY OFFICE VISIT NOTE  History:   Mariah Weaver is a 37 y.o. [id number] here today for follow up for chronic pelvic pain.  Pain is worsened around her periods but is constant and debilitating, interfering with her life.  She wants a hysterectomy. Accompanied by her mother who says she had the same thing and felt better after a hysterectomy. Patient was prescribed Elagolix last visit, she says it did not help and "worsened" her pain. She did not take it for long. Reports continued severe pelvic pain. She denies any other concerns.    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   Hx of varicella    Kidney stones    Klippel Trenaunay syndrome    Scoliosis    Scoliosis    Umbilical hernia     Past Surgical History:  Procedure Laterality Date   CESAREAN SECTION  04/07/2012   Procedure: CESAREAN SECTION;  Surgeon: Linda Hedges, DO;  Location: [facility];  Service: Obstetrics;  Laterality: N/A;  Primary Cesarean Section Delivery Baby  Boy  @ [phone],   Apgars 9/9   CESAREAN SECTION N/A 07/13/2018   Procedure: CESAREAN SECTION;  Surgeon: Sloan Leiter, MD;  Location: [facility];  Service: Obstetrics;  Laterality: N/A;   EAR CANALOPLASTY     LEG SURGERY     MANDIBLE RECONSTRUCTION     TONSILLECTOMY     TUBAL LIGATION  07/13/2018   Procedure: BILATERAL TUBAL LIGATION;  Surgeon: Sloan Leiter, MD;  Location: [facility];  Service: Obstetrics;;   VENTRAL HERNIA REPAIR N/A 10/09/2018   Procedure: LAPAROSCOPIC ASSISTED REPAIR OF EPIGASTRIC HERNIA WITH  MESH;  Surgeon: Fanny Skates, MD;  Location: [facility];  Service: General;  Laterality: N/A;    The following portions of the patient's history were reviewed and updated as appropriate: allergies, current medications, past family  history, past medical history, past social history, past surgical history and problem list.   Health Maintenance:  Normal pap  in 2017; she declined pap smear again today.    Review of Systems:  Pertinent items noted in HPI and remainder of comprehensive ROS otherwise negative.  Physical Exam:  BP (!) 127/91   Pulse 70   Wt 265 lb (120.2 kg)   LMP 05/26/2022   BMI 44.78 kg/m  CONSTITUTIONAL: Well-developed, well-nourished female in no acute distress.  HEENT:  Normocephalic, atraumatic. External right and left ear normal. No scleral icterus.  NECK: Normal range of motion, supple, no masses noted on observation SKIN: No rash noted. Not diaphoretic. No erythema. No pallor. MUSCULOSKELETAL: Normal range of motion. No edema noted. NEUROLOGIC: Alert and oriented to person, place, and time. Normal muscle tone coordination. No cranial nerve deficit noted. PSYCHIATRIC: Normal mood and affect. Normal behavior. Normal judgment and thought content. CARDIOVASCULAR: Normal heart rate noted RESPIRATORY: Effort and breath sounds normal, no problems with respiration noted ABDOMEN: No masses noted. No other overt distention noted.   PELVIC: Deferred  Labs and Imaging US PELVIC COMPLETE WITH TRANSVAGINAL  Result Date: 10/17/2021 CLINICAL DATA:  Painful menses for 2 months EXAM: TRANSABDOMINAL AND TRANSVAGINAL ULTRASOUND OF PELVIS TECHNIQUE: Both transabdominal and transvaginal  ultrasound examinations of the pelvis were performed. Transabdominal technique was performed for global imaging of the pelvis including uterus, ovaries, adnexal regions, and pelvic cul-de-sac. It was necessary to proceed with endovaginal exam following the transabdominal exam to visualize the endometrium and ovaries. COMPARISON:  None Available. FINDINGS: Uterus Measurements: 11.8 x 6.5 x 8.6 cm = volume: 346 mL. No fibroids or other mass visualized. Endometrium Thickness: 5 mm.  No focal abnormality visualized. Right ovary  Measurements: 5.1 x 2.9 x 3.7 cm = volume: 28.7 mL. Normal appearance/no adnexal mass. Left ovary Not visualized. Other findings No abnormal free fluid. IMPRESSION: 1. The left ovary is not visualized. 2. The uterus, endometrium, and right ovary are unremarkable. Electronically Signed   By: Dorise Bullion III M.D.   On: 10/17/2021 16:33      Assessment and Plan:     1. Chronic female pelvic pain 2. Dysmenorrhea Unsure if this pain is of GYN etiology, had normal ultrasound. And no response or rather worsened response to Elagolix.  Patient insists on hysterectomy. She was told that a hysterectomy may not solve this pain syndrome, if the etiology is not gynecologic, she can still have pain after surgery. Also she is high risk for surgery given her multiple previous abdominal surgeries, risk of adhesive disease.  Recommended referral to our chronic pelvic pain specialist/minimally invasive surgeon (Dr. Currie Paris), she agreed to this plan. An appointment was able to made for her on 07/19/22. Routine preventative health maintenance measures emphasized, emphasized that pap smear would be necessary if hysterectomy is planned for her. Please refer to After Visit Summary for other counseling recommendations.   Return for Appointment with Dr. Currie Paris at [facility].    I spent 30 minutes dedicated to the care of this patient including pre-visit review of records, face to face time with the patient discussing her conditions and treatments and post visit orders.    Verita Schneiders, MD, [place] for [employer], [place]

## 2022-07-19 ENCOUNTER — Ambulatory Visit: Payer: Medicaid Other | Admitting: Obstetrics and Gynecology

## 2023-07-15 DIAGNOSIS — M25562 Pain in left knee: Secondary | ICD-10-CM | POA: Diagnosis not present

## 2023-10-06 ENCOUNTER — Ambulatory Visit: Payer: Self-pay

## 2023-10-06 NOTE — Telephone Encounter (Signed)
 FYI Only or Action Required?: FYI only for provider.  Called Nurse Triage reporting Chest Pain.  Symptoms began today.  Interventions attempted: Nothing.  Symptoms are: unchanged.  Triage Disposition: Go to ED Now (or PCP Triage)  Patient/caregiver understands and will follow disposition?: Yes    Copied from CRM [id number]. Topic: Clinical - Red Word Triage >> Oct 06, 2023 11:43 AM Elle L wrote: Red Word that prompted transfer to Nurse Triage: The patient is having chest pain on the right side.     Reason for Disposition  [1] Chest pain lasts > 5 minutes AND [2] occurred in past 3 days (72 hours) (Exception: Feels exactly the same as previously diagnosed heartburn and has accompanying sour taste in mouth.)  Answer Assessment - Initial Assessment Questions 1. LOCATION: Where does it hurt?       Right side 2. RADIATION: Does the pain go anywhere else? (e.g., into neck, jaw, arms, back)     No radiation  3. ONSET: When did the chest pain begin? (Minutes, hours or days)      10-15 minutes  4. PATTERN: Does the pain come and go, or has it been constant since it started?  Does it get worse with exertion?      Constant  5. DURATION: How long does it last (e.g., seconds, minutes, hours)     10-15 minutes  6. SEVERITY: How bad is the pain?  (e.g., Scale 1-10; mild, moderate, or severe)     7/10 7. CARDIAC RISK FACTORS: Do you have any history of heart problems or risk factors for heart disease? (e.g., angina, prior heart attack; diabetes, high blood pressure, high cholesterol, smoker, or strong family history of heart disease)     Reports history of heart disease  8. PULMONARY RISK FACTORS: Do you have any history of lung disease?  (e.g., blood clots in lung, asthma, emphysema, birth control pills)     No 9. CAUSE: What do you think is causing the chest pain?     Unsure  10. OTHER SYMPTOMS: Do you have any other symptoms? (e.g., dizziness, nausea, vomiting,  sweating, fever, difficulty breathing, cough)       No 11. PREGNANCY: Is there any chance you are pregnant? When was your last menstrual period?       No  Protocols used: Chest Pain-A-AH

## 2023-11-10 ENCOUNTER — Ambulatory Visit: Admitting: Family Medicine

## 2024-02-10 ENCOUNTER — Ambulatory Visit: Admitting: Family Medicine
